# Patient Record
Sex: Male | Born: 1945
Health system: Southern US, Community
[De-identification: ages and names within clinical notes are randomized; demographics above are authoritative.]

## PROBLEM LIST (undated history)

## (undated) DIAGNOSIS — A4901 Methicillin susceptible Staphylococcus aureus infection, unspecified site: Secondary | ICD-10-CM

## (undated) DIAGNOSIS — M86169 Other acute osteomyelitis, unspecified tibia and fibula: Secondary | ICD-10-CM

## (undated) DIAGNOSIS — T847XXA Infection and inflammatory reaction due to other internal orthopedic prosthetic devices, implants and grafts, initial encounter: Secondary | ICD-10-CM

## (undated) DIAGNOSIS — E119 Type 2 diabetes mellitus without complications: Secondary | ICD-10-CM

## (undated) DIAGNOSIS — M869 Osteomyelitis, unspecified: Secondary | ICD-10-CM

## (undated) DIAGNOSIS — N400 Enlarged prostate without lower urinary tract symptoms: Secondary | ICD-10-CM

## (undated) DIAGNOSIS — I10 Essential (primary) hypertension: Secondary | ICD-10-CM

## (undated) HISTORY — DX: Osteomyelitis, unspecified: M86.9

## (undated) HISTORY — DX: Infection and inflammatory reaction due to other internal orthopedic prosthetic devices, implants and grafts, initial encounter: T84.7XXA

## (undated) HISTORY — DX: Methicillin susceptible Staphylococcus aureus infection, unspecified site: A49.01

## (undated) HISTORY — DX: Other acute osteomyelitis, unspecified tibia and fibula: M86.169

## (undated) HISTORY — DX: Essential (primary) hypertension: I10

---

## 1971-05-06 HISTORY — PX: OTHER SURGICAL HISTORY: SHX169

## 2004-08-05 ENCOUNTER — Ambulatory Visit: Payer: Self-pay | Admitting: Family Medicine

## 2004-08-12 ENCOUNTER — Ambulatory Visit: Payer: Self-pay | Admitting: Family Medicine

## 2004-08-23 ENCOUNTER — Ambulatory Visit: Payer: Self-pay | Admitting: Family Medicine

## 2004-10-28 ENCOUNTER — Ambulatory Visit: Payer: Self-pay | Admitting: Family Medicine

## 2004-11-21 ENCOUNTER — Ambulatory Visit: Payer: Self-pay | Admitting: Internal Medicine

## 2004-11-22 ENCOUNTER — Ambulatory Visit: Payer: Self-pay | Admitting: *Deleted

## 2005-01-24 ENCOUNTER — Ambulatory Visit: Payer: Self-pay | Admitting: Family Medicine

## 2006-12-23 ENCOUNTER — Ambulatory Visit: Payer: Self-pay | Admitting: Family Medicine

## 2007-02-04 ENCOUNTER — Encounter: Payer: Self-pay | Admitting: Physician Assistant

## 2007-04-05 ENCOUNTER — Encounter: Payer: Self-pay | Admitting: Physician Assistant

## 2007-04-05 ENCOUNTER — Ambulatory Visit: Payer: Self-pay | Admitting: Family Medicine

## 2007-09-21 ENCOUNTER — Encounter: Payer: Self-pay | Admitting: Physician Assistant

## 2009-01-03 HISTORY — PX: OTHER SURGICAL HISTORY: SHX169

## 2009-03-06 ENCOUNTER — Encounter: Payer: Self-pay | Admitting: Physician Assistant

## 2009-03-08 ENCOUNTER — Ambulatory Visit: Payer: Self-pay | Admitting: Physician Assistant

## 2009-03-14 ENCOUNTER — Encounter: Payer: Self-pay | Admitting: Physician Assistant

## 2009-03-16 ENCOUNTER — Encounter: Payer: Self-pay | Admitting: Physician Assistant

## 2009-03-19 ENCOUNTER — Ambulatory Visit (HOSPITAL_COMMUNITY): Admission: RE | Admit: 2009-03-19 | Discharge: 2009-03-19 | Payer: Self-pay | Admitting: Physician Assistant

## 2009-03-19 ENCOUNTER — Encounter: Payer: Self-pay | Admitting: Physician Assistant

## 2009-03-25 ENCOUNTER — Encounter: Payer: Self-pay | Admitting: Physician Assistant

## 2009-03-28 ENCOUNTER — Ambulatory Visit: Payer: Self-pay | Admitting: Physician Assistant

## 2009-04-02 ENCOUNTER — Encounter: Payer: Self-pay | Admitting: Physician Assistant

## 2009-04-06 ENCOUNTER — Encounter: Payer: Self-pay | Admitting: Physician Assistant

## 2009-04-09 ENCOUNTER — Encounter: Payer: Self-pay | Admitting: Physician Assistant

## 2009-04-23 ENCOUNTER — Encounter: Payer: Self-pay | Admitting: Physician Assistant

## 2009-06-08 ENCOUNTER — Encounter: Payer: Self-pay | Admitting: Physician Assistant

## 2009-06-08 ENCOUNTER — Ambulatory Visit: Payer: Self-pay | Admitting: Physician Assistant

## 2009-06-08 DIAGNOSIS — S82899A Other fracture of unspecified lower leg, initial encounter for closed fracture: Secondary | ICD-10-CM

## 2009-06-08 LAB — CONVERTED CEMR LAB
Casts: NONE SEEN /lpf
Ketones, urine, test strip: NEGATIVE
Nitrite: NEGATIVE
RBC / HPF: NONE SEEN (ref <3)
Specific Gravity, Urine: 1.015
Urobilinogen, UA: 0.2
pH: 6

## 2009-06-09 ENCOUNTER — Encounter: Payer: Self-pay | Admitting: Physician Assistant

## 2009-06-11 ENCOUNTER — Encounter: Payer: Self-pay | Admitting: Physician Assistant

## 2009-06-11 LAB — CONVERTED CEMR LAB
AST: 23 units/L (ref 0–37)
Alkaline Phosphatase: 143 units/L — ABNORMAL HIGH (ref 39–117)
BUN: 7 mg/dL (ref 6–23)
Basophils Absolute: 0 10*3/uL (ref 0.0–0.1)
Basophils Relative: 0 % (ref 0–1)
Creatinine, Ser: 0.81 mg/dL (ref 0.40–1.50)
Eosinophils Relative: 2 % (ref 0–5)
HCT: 46.8 % (ref 39.0–52.0)
Lymphocytes Relative: 30 % (ref 12–46)
Platelets: 223 10*3/uL (ref 150–400)
Potassium: 4.2 meq/L (ref 3.5–5.3)
RDW: 14.9 % (ref 11.5–15.5)
Total Bilirubin: 0.4 mg/dL (ref 0.3–1.2)

## 2009-06-12 ENCOUNTER — Telehealth: Payer: Self-pay | Admitting: Physician Assistant

## 2009-07-31 ENCOUNTER — Ambulatory Visit: Payer: Self-pay | Admitting: Physician Assistant

## 2009-07-31 DIAGNOSIS — K5900 Constipation, unspecified: Secondary | ICD-10-CM

## 2009-07-31 DIAGNOSIS — I10 Essential (primary) hypertension: Secondary | ICD-10-CM | POA: Insufficient documentation

## 2009-08-07 ENCOUNTER — Ambulatory Visit: Payer: Self-pay | Admitting: Physician Assistant

## 2009-08-14 ENCOUNTER — Ambulatory Visit: Payer: Self-pay | Admitting: Physician Assistant

## 2009-08-14 LAB — CONVERTED CEMR LAB
CO2: 24 meq/L (ref 19–32)
Calcium: 9.2 mg/dL (ref 8.4–10.5)
Chloride: 105 meq/L (ref 96–112)
Creatinine, Ser: 0.89 mg/dL (ref 0.40–1.50)
Glucose, Bld: 90 mg/dL (ref 70–99)

## 2009-08-15 ENCOUNTER — Encounter: Payer: Self-pay | Admitting: Physician Assistant

## 2009-09-11 ENCOUNTER — Ambulatory Visit: Payer: Self-pay | Admitting: Physician Assistant

## 2009-09-11 LAB — CONVERTED CEMR LAB
ALT: 15 units/L (ref 0–53)
Albumin: 3.9 g/dL (ref 3.5–5.2)
Alkaline Phosphatase: 132 units/L — ABNORMAL HIGH (ref 39–117)
Total Protein: 7 g/dL (ref 6.0–8.3)

## 2009-09-12 ENCOUNTER — Encounter: Payer: Self-pay | Admitting: Physician Assistant

## 2010-01-21 ENCOUNTER — Encounter: Payer: Self-pay | Admitting: Physician Assistant

## 2010-02-20 ENCOUNTER — Encounter (INDEPENDENT_AMBULATORY_CARE_PROVIDER_SITE_OTHER): Payer: Self-pay | Admitting: *Deleted

## 2010-03-11 ENCOUNTER — Ambulatory Visit: Payer: Self-pay | Admitting: Physician Assistant

## 2010-03-11 ENCOUNTER — Encounter (INDEPENDENT_AMBULATORY_CARE_PROVIDER_SITE_OTHER): Payer: Self-pay | Admitting: Nurse Practitioner

## 2010-03-11 LAB — CONVERTED CEMR LAB
Blood in Urine, dipstick: NEGATIVE
Chlamydia, Swab/Urine, PCR: NEGATIVE
GC Probe Amp, Urine: NEGATIVE
HDL goal, serum: 40 mg/dL
LDL Goal: 130 mg/dL
Nitrite: NEGATIVE
Specific Gravity, Urine: >=1.03
Urobilinogen, UA: 0.2
WBC Urine, dipstick: NEGATIVE

## 2010-03-12 ENCOUNTER — Ambulatory Visit: Payer: Self-pay | Admitting: Nurse Practitioner

## 2010-03-13 ENCOUNTER — Encounter (INDEPENDENT_AMBULATORY_CARE_PROVIDER_SITE_OTHER): Payer: Self-pay | Admitting: Nurse Practitioner

## 2010-03-13 LAB — CONVERTED CEMR LAB
Cholesterol: 205 mg/dL — ABNORMAL HIGH (ref 0–200)
HDL: 46 mg/dL (ref >39)
LDL Cholesterol: 118 mg/dL — ABNORMAL HIGH (ref 0–99)
Triglycerides: 207 mg/dL — ABNORMAL HIGH (ref <150)

## 2010-06-02 LAB — CONVERTED CEMR LAB
Blood in Urine, dipstick: NEGATIVE
Protein, U semiquant: NEGATIVE
Urobilinogen, UA: 0.2
WBC Urine, dipstick: NEGATIVE

## 2010-06-04 NOTE — Letter (Signed)
Summary: *HSN Results Follow up  [facility]  [address] [place], [place] [address]   Phone: [phone]  Fax: [phone]      02/20/2010   Louis Peterson [address] [place], [place]  [address]   Dear  Mr. [name],                            ____S.Drinkard,FNP   ____D. Gore,FNP       ____B. McPherson,MD   ____V. Rankins,MD    ____E. Mulberry,MD    ____N. Daphine Deutscher, FNP  ____D. Reche Dixon, MD    ____K. Philipp Deputy, MD    ____Other     This letter is to inform you that your recent test(s):  _______Pap Smear    _______Lab Test     _______X-ray    _______ is within acceptable limits  _______ requires a medication change  _______ requires a follow-up lab visit  ___X____ requires a follow-up visit with your provider   Comments: We have been trying to reach you.  Please give the office a call at your earliest convenience.       _________________________________________________________ If you have any questions, please contact our office                     Sincerely,  [place] Shannon [facility]

## 2010-06-04 NOTE — Medication Information (Signed)
Summary: RX Folder//REFILL  RX Folder//REFILL   Imported By: Arta Bruce 02/15/2010 09:53:05  _____________________________________________________________________  External Attachment:    Type:   Image     Comment:   External Document

## 2010-06-04 NOTE — Assessment & Plan Note (Signed)
Summary: Complete Physical Exam   Vital Signs:  Patient profile:   65 year old male Height:      66.75 inches Weight:      231.7 pounds BMI:     36.69 Temp:     97.2 degrees F oral Pulse rate:   100 / minute Pulse rhythm:   regular Resp:     20 per minute BP sitting:   124 / 70  (left arm) Cuff size:   large  Vitals Entered By: [name] (March 11, 2010 9:46 AM)  Nutrition Counseling: Patient's BMI is greater than 25 and therefore counseled on weight management options. CC: CPE, Lipid Management, Hypertension Management Is Patient Diabetic? No Pain Assessment Patient in pain? no       Does patient need assistance? Functional Status Self care Ambulation Normal   Primary Care [name]:  Tereso Newcomer, PA-C  CC:  CPE, Lipid Management, and Hypertension Management.  History of Present Illness:  Pt into the office for a complete physical exam  Social - Married  6 children (Two adult children present with pt today)  Optho - Last eye exam was 3 years ago.  No current glasses or contacts ? weakness of eye since childhood.    Dental - last dental exam was 5 years ago.    Pt is NOT fasting today for labs. He ate  breakfast today.  Tetanus - pt was in an accident in [place] one year ago at which time he had a trauma to his leg and thinks he was given the tetanus  Pt did not bring medications with him today in office.  Advised pt to bring meds to each appt  Colonscopy - no history  Pt admits to some constipation  Hypertension History:      He denies headache, chest pain, and palpitations.  He notes no problems with any antihypertensive medication side effects.  Pt has not taken lisinopril for the past 4-5 months. He was told to stop the medication.        Positive major cardiovascular risk factors include male age 95 years old or older and hypertension.  Negative major cardiovascular risk factors include non-tobacco-user status.    Lipid Management History:     Positive NCEP/ATP III risk factors include male age 24 years old or older and hypertension.  Negative NCEP/ATP III risk factors include non-tobacco-user status.      Habits & Providers  Alcohol-Tobacco-Diet     Alcohol drinks/day: 0     Tobacco Status: never  Exercise-Depression-Behavior     Does Patient Exercise: no     Exercise Counseling: not indicated; exercise is adequate     Have you felt down or hopeless? no     Have you felt little pleasure in things? no     Depression Counseling: not indicated; screening negative for depression     Drug Use: never  Comments: PHQ-9 score = 5  Allergies (verified): No Known Drug Allergies  Social History: Smoking Status:  never Drug Use:  never Does Patient Exercise:  no Ethnicity:  Hispanic/Latino/Black Sex Orientation:  Heterosexual Alcohol:  None Education:  illiterate  Review of Systems General:  Denies fever. Eyes:  Denies blurring. ENT:  Denies earache. CV:  Denies chest pain or discomfort. Resp:  Complains of cough; denies wheezing; at night. GI:  Complains of constipation; denies abdominal pain, nausea, and vomiting. GU:  Complains of urinary frequency. MS:  Denies joint pain. Derm:  Denies dryness. Neuro:  Denies headaches.  Psych:  Denies anxiety and depression.  Physical Exam  General:  alert.   Head:  normocephalic.   Eyes:  dilated vessel Ears:  right cerumen impaction left ear with visualization of TM Nose:  no external deformity.   Mouth:  pharynx pink and moist and fair dentition.  some visble caries Neck:  supple.   Chest Wall:  no deformities.   Breasts:  no gynecomastia.   Lungs:  normal breath sounds.   Heart:  normal rate.   Abdomen:  slight distention Rectal:  no external abnormalities.   Genitalia:  uncircumcised and no hydrocele.   Prostate:  no gland enlargement and no nodules.   Msk:  left - evidence of previous trauma Pulses:  R radial normal and L radial normal.   Extremities:   trace left pedal edema and trace right pedal edema.   Neurologic:  alert & oriented X3.   Skin:  color normal.   Psych:  Oriented X3.     Impression & Recommendations:  Problem # 1:  HEALTH MAINTENANCE EXAM (ICD-V70.0) pt to return tomorrow for labs rec optho and dental exam PHQ-9 score = 5 EKG done earlier this year Colonscopy refused at this time due to cost - indication given to pt and his family Orders: UA Dipstick w/o Micro (manual) (04540) Hemoccult Guaiac-1 spec.(in office) (82270) T-GC Probe, urine ([id number])  Problem # 2:  ESSENTIAL HYPERTENSION, BENIGN (ICD-401.1) BP is stable no current meds DASH diet His updated medication list for this problem includes:    Lisinopril 10 Mg Tabs (Lisinopril) ..... Hold  Orders: T-Urine Microalbumin w/creat. ratio [phone])  Problem # 3:  CERUMEN IMPACTION, RIGHT (ICD-380.4) irrigation done today in office  advised pt not to use q-tips in ears Orders: Cerumen Impaction Removal (96295)  Problem # 4:  NEED PROPHYLACTIC VACCINATION&INOCULATION FLU (ICD-V04.81) given today in office  Problem # 5:  CONSTIPATION (ICD-564.00) high fiber diet sheet given to pt His updated medication list for this problem includes:    Miralax Powd (Polyethylene glycol 3350) .Marland Kitchen... 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 1-2 puffs every 4-6 hours as needed  Complete Medication List: 1)  Flomax 0.4 Mg Caps (Tamsulosin hcl) .... Take 1 tablet by mouth once a day 2)  Ventolin Hfa 108 (90 Base) Mcg/act Aers (Albuterol sulfate) .Marland Kitchen.. 1-2 puffs every 4-6 hours as needed 3)  Finasteride 5 Mg Tabs (Finasteride) .... Take 1 tablet by mouth once a day 4)  Miralax Powd (Polyethylene glycol 3350) .Marland Kitchen.. 1 capful  dissolved in a glass of water and drink one glass a day 5)  Lisinopril 10 Mg Tabs (Lisinopril) .... Hold  Other Orders: Flu Vaccine 31yrs + (28413) Admin 1st Vaccine (24401)  Hypertension Assessment/Plan:      The patient's hypertensive risk group is category B: At least one risk factor (excluding diabetes) with no target organ damage.  Today's blood pressure is 124/70.  His blood pressure goal is < 140/90.  Lipid Assessment/Plan:      Based on NCEP/ATP III, the patient's risk factor category is "0-1 risk factors".  The patient's lipid goals are as follows: Total cholesterol goal is 200; LDL cholesterol goal is 130; HDL cholesterol goal is 40; Triglyceride goal is 150.  Patient Instructions: 1)  Return on tomorrow for fasting labs (no co-pay if you return on TUESDAY; will attach to today's visit) lipids, HIV, cbg.  You will also need Td if you did not received it last year in [place] (ask your wife) 2)  Schedule an appointment for retasure eye exam at [facility] 3)  Also you can go to the free vision screening later this week. 4)  Dentist - Free dental clinic later this week.   5)  Constipation - eat more foods with fiber.  Drink plenty of water. 6)  Get refills on miralax and take daily to keep stools soft 7)  Blood pressure is doing good.  Your urine will be checked today.  If kidneys are working too hard you may need to restart a low dose blood pressure medication for kidney protection. 8)  Follow up in 6 months or sooner for routine follow up.   Orders Added: 1)  Est. Patient age 30-64 [42] 2)  UA Dipstick w/o Micro (manual) [81002] 3)  T-Urine Microalbumin w/creat. ratio [[id number]] 4)  Hemoccult Guaiac-1 spec.(in office) [82270] 5)  Cerumen Impaction Removal [69210] 6)  T-GC Probe, urine [phone] 7)  Flu Vaccine 97yrs + [90658] 8)  Admin 1st Vaccine [90471]   Immunizations Administered:  Influenza Vaccine # 1:    Vaccine Type: Fluvax 3+    Site: right  deltoid    Mfr: [email or web address]    Dose: 0.5 ml    Route: IM    Given by: [name]    Exp. Date: 11/02/2010    Lot #: [id number]    VIS given: 11/27/09 version given March 11, 2010.  Flu Vaccine Consent Questions:    Do you have a history of severe allergic reactions to this vaccine? no    Any prior history of allergic reactions to egg and/or gelatin? no    Do you have a sensitivity to the preservative Thimersol? no    Do you have a past history of Guillan-Barre Syndrome? no    Do you currently have an acute febrile illness? no    Have you ever had a severe reaction to latex? no    Vaccine information given and explained to patient? yes    ndc  [phone]  Immunizations Administered:  Influenza Vaccine # 1:    Vaccine Type: Fluvax 3+    Site: right deltoid    Mfr: GlaxoSmithKline    Dose: 0.5 ml    Route: IM    Given by: [name]    Exp. Date: 11/02/2010    Lot #: [id number]    VIS given: 11/27/09 version given March 11, 2010.  Prevention & Chronic Care Immunizations   Influenza vaccine: Fluvax 3+  (03/11/2010)    Tetanus booster: Not documented   Td booster deferral: Deferred  (03/11/2010)    Pneumococcal vaccine: Not documented    H. zoster vaccine: Not documented  Colorectal Screening   Hemoccult: Not documented   Hemoccult action/deferral: Ordered  (03/11/2010)   Hemoccult due: 03/12/2011    Colonoscopy: Not documented   Colonoscopy action/deferral: Refused  (03/11/2010)  Other Screening   PSA: 2.17  (06/08/2009)   PSA due due: 03/12/2011   Smoking status: never  (03/11/2010)  Lipids   Total Cholesterol: Not documented   LDL: Not documented   LDL Direct: Not documented   HDL: Not documented   Triglycerides: Not documented  Hypertension   Last Blood Pressure: 124 / 70  (03/11/2010)   Serum creatinine:  0.89  (08/14/2009)   Serum potassium 4.5  (08/14/2009)  Self-Management Support :    Hypertension self-management support:  Not documented   Nursing Instructions: Give Flu vaccine today      Laboratory Results   Urine Tests  Date/Time Received: March 11, 2010 11:18 AM   Routine Urinalysis   Color: lt. yellow Appearance: Clear Glucose: negative   (Normal Range: Negative) Bilirubin: negative   (Normal Range: Negative) Ketone: negative   (Normal Range: Negative) Spec. Gravity: >=1.030   (Normal Range: 1.003-1.035) Blood: negative   (Normal Range: Negative) pH: 5.0   (Normal Range: 5.0-8.0) Protein: negative   (Normal Range: Negative) Urobilinogen: 0.2   (Normal Range: 0-1) Nitrite: negative   (Normal Range: Negative) Leukocyte Esterace: negative   (Normal Range: Negative)      Stool - Occult Blood Hemmoccult #1: negative Date: 03/11/2010   Laboratory Results   Urine Tests    Routine Urinalysis   Color: lt. yellow Appearance: Clear Glucose: negative   (Normal Range: Negative) Bilirubin: negative   (Normal Range: Negative) Ketone: negative   (Normal Range: Negative) Spec. Gravity: >=1.030   (Normal Range: 1.003-1.035) Blood: negative   (Normal Range: Negative) pH: 5.0   (Normal Range: 5.0-8.0) Protein: negative   (Normal Range: Negative) Urobilinogen: 0.2   (Normal Range: 0-1) Nitrite: negative   (Normal Range: Negative) Leukocyte Esterace: negative   (Normal Range: Negative)      Stool - Occult Blood Hemmoccult #1: negative

## 2010-06-04 NOTE — Letter (Signed)
Summary: [facility] SCHEDULING  [facility] SCHEDULING   Imported By: Arta Bruce 02/15/2010 09:51:06  _____________________________________________________________________  External Attachment:    Type:   Image     Comment:   External Document

## 2010-06-04 NOTE — Letter (Signed)
Summary: MAILED REQUESTED RECORSD TO [facility]  MAILED REQUESTED RECORSD TO [facility]   Imported By: Arta Bruce 01/21/2010 14:20:19  _____________________________________________________________________  External Attachment:    Type:   Image     Comment:   External Document

## 2010-06-04 NOTE — Progress Notes (Signed)
Summary: Office Visit//SCANNED INTO EMR  Office Visit//SCANNED INTO EMR   Imported By: Arta Bruce 02/15/2010 10:20:37  _____________________________________________________________________  External Attachment:    Type:   Image     Comment:   External Document

## 2010-06-04 NOTE — Letter (Signed)
Summary: Letter//LAB RESULTS  Letter//LAB RESULTS   Imported By: Arta Bruce 02/15/2010 09:54:49  _____________________________________________________________________  External Attachment:    Type:   Image     Comment:   External Document

## 2010-06-04 NOTE — Progress Notes (Signed)
Summary: Office Visit//SCANNED INTO EMR  Office Visit//SCANNED INTO EMR   Imported By: Arta Bruce 02/15/2010 09:33:41  _____________________________________________________________________  External Attachment:    Type:   Image     Comment:   External Document

## 2010-06-04 NOTE — Progress Notes (Signed)
Summary: Office Visit//DEPRESSION SCREENING  Office Visit//DEPRESSION SCREENING   Imported By: Arta Bruce 03/11/2010 16:18:06  _____________________________________________________________________  External Attachment:    Type:   Image     Comment:   External Document

## 2010-06-04 NOTE — Letter (Signed)
Summary: REQUESTING RECORDS FROM [facility]  REQUESTING RECORDS FROM [facility]   Imported By: Arta Bruce 02/15/2010 09:41:09  _____________________________________________________________________  External Attachment:    Type:   Image     Comment:   External Document

## 2010-06-04 NOTE — Letter (Signed)
Summary: *HSN Results Follow up  [facility]  [address] [place], [place] [address]   Phone: [phone]  Fax: [phone]      08/15/2009   Prithvi ESPITIA TOLEDO [address] [place], [place]  [address]   Dear  Mr. Carr ESPITIA TOLEDO,                            ____S.Drinkard,FNP   ____D. Gore,FNP       ____B. McPherson,MD   ____V. Rankins,MD    ____E. Mulberry,MD    ____N. Daphine Deutscher, FNP  ____D. Reche Dixon, MD    ____K. Philipp Deputy, MD    __x__S. Alben Spittle, PA-C     This letter is to inform you that your recent test(s):  _______Pap Smear    ___x____Lab Test     _______X-ray    ___x____ is within acceptable limits  _______ requires a medication change  _______ requires a follow-up lab visit  _______ requires a follow-up visit with your provider   Comments:       _________________________________________________________ If you have any questions, please contact our office                     Sincerely,  Tereso Newcomer PA-C [facility]

## 2010-06-04 NOTE — Letter (Signed)
Summary: urology notes  urology notes   Imported By: Arta Bruce 02/15/2010 10:18:20  _____________________________________________________________________  External Attachment:    Type:   Image     Comment:   External Document

## 2010-06-04 NOTE — Progress Notes (Signed)
  Phone Note Outgoing Call   Summary of Call: He needs treatment for UTI. Did we find out what pharmacy to send his meds. to? Initial call taken by: Brynda Rim,  June 12, 2009 5:09 PM  Follow-up for Phone Call        spoke with daughter in law and she is aware of meeting with susie for nutruction and pharmacy is [facility] Follow-up by: [place] Shannon,  June 13, 2009 12:55 PM  Additional Follow-up for Phone Call Additional follow up Details #1::        Will send cipro to [facility] pharmacy.  Pick up today or tomorrow.  Finish it all. He was c/o leaking at his last visit. Have patient call in 2 weeks if symptoms of leaking do not resolve.  Additional Follow-up by: Tereso Newcomer PA-C,  June 13, 2009 1:41 PM    Additional Follow-up for Phone Call Additional follow up Details #2::    spoke with daughter in law [place] and she said pt says he is doing alot better Follow-up by: [place] Shannon,  June 19, 2009 9:28 AM  Prescriptions: CIPRO 500 MG TABS (CIPROFLOXACIN HCL) Take 1 tablet by mouth two times a day until all gone (please write in Spanish)  #14 x 0   Entered and Authorized by:   Tereso Newcomer PA-C   Signed by:   Tereso Newcomer PA-C on 06/13/2009   Method used:   Faxed to ...       [facility] (retail)       [address] [place], [place]  [address]       Ph: [phone]       Fax: [phone]   RxID:   [id number]

## 2010-06-04 NOTE — Letter (Signed)
Summary: Letter//REFERRAL LETTER  Letter//REFERRAL LETTER   Imported By: Arta Bruce 02/15/2010 09:58:46  _____________________________________________________________________  External Attachment:    Type:   Image     Comment:   External Document

## 2010-06-04 NOTE — Letter (Signed)
Summary: Letter//LAB RESULTS  Letter//LAB RESULTS   Imported By: Arta Bruce 02/15/2010 10:05:07  _____________________________________________________________________  External Attachment:    Type:   Image     Comment:   External Document

## 2010-06-04 NOTE — Letter (Signed)
Summary: INFLUENZA VACCINATION  INFLUENZA VACCINATION   Imported By: Arta Bruce 02/15/2010 10:23:25  _____________________________________________________________________  External Attachment:    Type:   Image     Comment:   External Document

## 2010-06-04 NOTE — Assessment & Plan Note (Signed)
Summary: f/u in 6 wks with Geral Coker for prostate//gk   Vital Signs:  Patient profile:   65 year old male Weight:      229.50 pounds Temp:     97.9 degrees F Pulse rate:   90 / minute Pulse rhythm:   regular Resp:     18 per minute BP sitting:   152 / 108  (left arm) Cuff size:   large  Vitals Entered By: Chauncy Passy, SMA  Serial Vital Signs/Assessments:  Time      Position  BP       Pulse  Resp  Temp     By 9:00 AM             142/96                         Tereso Newcomer PA-C  CC: Pt. is here for a f/u from 06/08/09 for his prostate. Pt. states his doing better but the only thing bothering him today is that he has been constipated for the last 3 days.  Is Patient Diabetic? No Pain Assessment Patient in pain? no       Does patient need assistance? Functional Status Self care Ambulation Normal   Primary Care Provider:  Tereso Newcomer, PA-C  CC:  Pt. is here for a f/u from 06/08/09 for his prostate. Pt. states his doing better but the only thing bothering him today is that he has been constipated for the last 3 days. Marland Kitchen  History of Present Illness: Here for f/u.  Had incontinence last time and I check a urine cx.  It was + and I tx him for UTI  No further incontinence.  His urine stream is better.  No significant nocturia.  He is taking both flomax and finasteride.    He is constipated x 3 days.  He has noted more problems with constipation since his accident.  No diarrhea.  No fevers or chills.  No melena or hematochezia.    Has been watching his weight and dieting.  He has lost 7 pounds.  He is complaining of yellow, thickened toenails that are hard to clip.  Current Medications (verified): 1)  Flomax 0.4 Mg Caps (Tamsulosin Hcl) .... Take 1 Tablet By Mouth Once A Day 2)  Ventolin Hfa 108 (90 Base) Mcg/act Aers (Albuterol Sulfate) .Marland Kitchen.. 1-2 Puffs Every 4-6 Hours As Needed 3)  Prilosec 20 Mg Cpdr (Omeprazole) .... Take 1 Tablet By Mouth Two Times A Day 4)  Finasteride 5 Mg  Tabs (Finasteride) .... Take 1 Tablet By Mouth Once A Day  Allergies (verified): No Known Drug Allergies  Review of Systems      See HPI General:  Denies chills and fever. CV:  Denies chest pain or discomfort, fainting, and shortness of breath with exertion. Resp:  Denies cough. GI:  Complains of indigestion. GU:  Denies dysuria and incontinence.  Physical Exam  General:  alert, well-developed, and well-nourished.   Head:  normocephalic and atraumatic.   Neck:  supple.   Lungs:  normal breath sounds.   Heart:  normal rate and regular rhythm.   Abdomen:  soft and non-tender.   Msk:  bilat toenails with thickening and yellowing c/w onychomycosis Extremities:  trace left pedal edema.   Neurologic:  alert & oriented X3 and cranial nerves II-XII intact.   Psych:  normally interactive and good eye contact.     Impression & Recommendations:  Problem # 1:  CONSTIPATION (ICD-564.00)  increase fiber try metamucil give Rx for Miralax if he needs it handout given  His updated medication list for this problem includes:    Miralax Powd (Polyethylene glycol 3350) .Marland Kitchen... 1 capful dissolved in a glass of water and drink one glass a day  Problem # 2:  URGE INCONTINENCE (ICD-788.31)  resolved with tx of UTI check u/a  Orders: T-Urinalysis ([id number])  Problem # 3:  GLUCOSE INTOLERANCE (ICD-271.3) needs to see dietician A1C was 6.3 when checked  Problem # 4:  ONYCHOMYCOSIS, TOENAILS (ICD-110.1)  start Lamisil reck LFTs in 6 weeks  His updated medication list for this problem includes:    Lamisil 250 Mg Tabs (Terbinafine hcl) .Marland Kitchen... 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 Take 1 tablet by mouth once a day for blood pressure (write in spanish)  Orders: EKG w/ Interpretation (93000)  Problem # 7:  PREVENTIVE HEALTH CARE (ICD-V70.0) schedule CPE  Complete Medication List: 1)  Flomax 0.4 Mg Caps (Tamsulosin hcl) .... Take 1 tablet by mouth once a day 2)  Ventolin Hfa 108 (90 Base) Mcg/act Aers (Albuterol sulfate) .Marland Kitchen.. 1-2 puffs every 4-6 hours as needed 3)  Prilosec 20 Mg Cpdr (Omeprazole) .... Take 1 tablet by mouth two times a day 4)  Finasteride 5 Mg Tabs (Finasteride) .... Take 1 tablet by mouth once a day 5)  Miralax Powd (Polyethylene glycol 3350) .Marland Kitchen.. 1 capful dissolved in a glass of water and drink one glass a day 6)  Lamisil 250 Mg Tabs (Terbinafine hcl) .... Take 1 tablet by mouth once a day for nail fungus (write in spanish) 7)  Lisinopril 10 Mg Tabs (Lisinopril) .... Take 1 tablet by mouth once a day for blood pressure (write in spanish)  Patient Instructions: 1)  Increase fiber in your diet.  See handout. 2)  You can get Metamucil at the drug store and drink one glass a day. 3)  If you do not get enough relief from the Metamucil, get the prescription filled for Miralax and drink one glass a day. 4)  Return to lab in 6 weeks for Hepatic Function Panel (Dx 110.1). 5)  Schedule appt with Susie Piper for glucose intolerance. 6)  Return to lab in 2 weeks for BMET (401.1) and BP check. 7)  Please schedule a follow-up appointment in 3 months with Ulus Hazen for CPE.  8)  Come fasting to CPE for labwork (nothing to eat or drink after midnight except water). Prescriptions: LISINOPRIL 10 MG TABS (LISINOPRIL) Take 1 tablet by mouth once a day for blood pressure (write in Spanish)  #30 x 5   Entered and Authorized by:   Tereso Newcomer PA-C   Signed by:   Tereso Newcomer PA-C on 07/31/2009   Method used:   Print then Give to Patient   RxID:    [id number] LAMISIL 250 MG TABS (TERBINAFINE HCL) Take 1 tablet by mouth once a day for nail fungus (write in Spanish)  #30 x 2   Entered and Authorized by:  Tereso Newcomer PA-C   Signed by:   Tereso Newcomer PA-C on 07/31/2009   Method used:   Print then Give to Patient   RxID:   [id number] MIRALAX  POWD (POLYETHYLENE GLYCOL 3350) 1 capful dissolved in a glass of water and drink one glass a day  #1 bottle x 3   Entered and Authorized by:   Tereso Newcomer PA-C   Signed by:   Tereso Newcomer PA-C on 07/31/2009   Method used:   Print then Give to Patient   RxID:   [id number]   Laboratory Results   Urine Tests  Date/Time Received: July 31, 2009 10:21 AM  Date/Time Reported: July 31, 2009 10:21 AM   Routine Urinalysis   Color: yellow Appearance: Clear Glucose: negative   (Normal Range: Negative) Bilirubin: negative   (Normal Range: Negative) Ketone: negative   (Normal Range: Negative) Spec. Gravity: 1.025   (Normal Range: 1.003-1.035) Blood: negative   (Normal Range: Negative) pH: 6.5   (Normal Range: 5.0-8.0) Protein: negative   (Normal Range: Negative) Urobilinogen: 0.2   (Normal Range: 0-1) Nitrite: negative   (Normal Range: Negative) Leukocyte Esterace: negative   (Normal Range: Negative)         EKG  Procedure date:  07/31/2009  Findings:      Normal sinus rhythm with rate of:  63 normal axis  no acute changes

## 2010-06-04 NOTE — Letter (Signed)
Summary: Handout Printed  Printed Handout:  - Diet - High-Fiber 

## 2010-06-04 NOTE — Medication Information (Signed)
Summary: Tax adviser   Imported By: Arta Bruce 02/15/2010 10:25:43  _____________________________________________________________________  External Attachment:    Type:   Image     Comment:   External Document

## 2010-06-04 NOTE — Assessment & Plan Note (Signed)
Vital Signs:  Patient profile:   65 year old male Weight:      236 pounds Temp:     97.7 degrees F oral Pulse rate:   58 / minute Pulse rhythm:   regular BP sitting:   154 / 82  (left arm) Cuff size:   large  Vitals Entered By: [place] Shannon (June 08, 2009 9:15 AM)  Serial Vital Signs/Assessments:  Time      Position  BP       Pulse  Resp  Temp     By 10:08 AM            138/84                         Tereso Newcomer PA-C  CC: f/u on prostate and leg Is Patient Diabetic? No  Does patient need assistance? Functional Status Self care Ambulation Normal   CC:  f/u on prostate and leg.  History of Present Illness: Here for f/u. Of note, his chart has been mixed up with his son's chart. He is called "Daisy" and his son is called "Chava."  Left Leg Fracture:  Has seen ortho at [facility].  Plan is for rehab first.  F/u in 3 mos.  Will decide +/- surgery in next 3-6 mos.  BPH:  Has noticed difficulty with urinary frequency and urge incontinence since his accident in July.  No changes.  No dysuria.  No stress incontinence.  No fevers or chills.    BP:  Remains elevated.  No chest pain, dyspnea or headaches.  Did drink coffee prior to coming in today.   Physical Exam  General:  alert, well-developed, and well-nourished.   Head:  normocephalic and atraumatic.   Neck:  supple and no carotid bruits.   Lungs:  normal breath sounds, no crackles, and no wheezes.   Heart:  normal rate, regular rhythm, and no murmur.   Genitalia:  circumcised, no hydrocele, no varicocele, no scrotal masses, no testicular masses or atrophy, no cutaneous lesions, and no urethral discharge.   Prostate:  no nodules, no asymmetry, and no induration.   slightly enlarged (<1+) Msk:  antalgic gait walks with crutches Neurologic:  alert & oriented X3 and cranial nerves II-XII intact.   Psych:  normally interactive.     Impression & Recommendations:  Problem # 1:  URGE INCONTINENCE (ICD-788.31)  check  u/a check PSA hold flomax  if above unrevealing, will need referral back to urology (? neurogenic bladder 2/2 accident) will have to go to [facility] due to our patients not being seen at [facility]  Orders: T-Urinalysis ([id number]) T-PSA (15176-16073) T-Comprehensive Metabolic Panel [phone]) T-CBC w/Diff ([id number]) T-Culture, Urine ([id number]) T- * Misc. 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)  as above continue finasteride  Orders: T-PSA ([id number])  Problem # 3:  INCREASED BLOOD PRESSURE (ICD-796.2)  repeat bp ok watch salt and increase activity  continue to monitor  Orders: T-Comprehensive Metabolic Panel ([id number])  Problem # 4:  FRACTURE, LEG, LEFT (ICD-827.0) f/u at [facility] as planned  Problem # 5:  Preventive Health Care (ICD-V70.0)  check labs today will eventually need to schedule CPE   Orders: T-Comprehensive Metabolic Panel ([id number]) T-CBC w/Diff ([id number]) T-TSH ([id number])  Complete Medication List: 1)  Flomax 0.4 Mg Caps (Tamsulosin hcl) .... Take 1 tablet by mouth once a day 2)  Ventolin Hfa 108 (90 Base)  Mcg/act Aers (Albuterol sulfate) .Marland Kitchen.. 1-2 puffs every 4-6 hours as needed 3)  Prilosec 20 Mg Cpdr (Omeprazole) .... Take 1 tablet by mouth two times a day 4)  Finasteride 5 Mg Tabs (Finasteride) .... Take 1 tablet by mouth once a day  Patient Instructions: 1)  Please schedule a follow-up appointment in 6 weeks with Lorin Picket for prostate.  2)  Watch salt. 3)  Limit caffeine. 4)  Increase activity as tolerated. 5)  Do not take Flomax for 2 weeks.  If symptoms get worse, restart the medicine.  If your symptoms improve, stay off of Flomax. 6)  Follow up with the orthopedist at [facility] as planned.  Laboratory Results   Urine Tests    Routine Urinalysis   Glucose: negative   (Normal Range: Negative) Bilirubin: negative   (Normal Range: Negative) Ketone: negative   (Normal  Range: Negative) Spec. Gravity: 1.015   (Normal Range: 1.003-1.035) Blood: trace-intact   (Normal Range: Negative) pH: 6.0   (Normal Range: 5.0-8.0) Protein: negative   (Normal Range: Negative) Urobilinogen: 0.2   (Normal Range: 0-1) Nitrite: negative   (Normal Range: Negative) Leukocyte Esterace: negative   (Normal Range: Negative)

## 2010-06-04 NOTE — Letter (Signed)
Summary: NUTRITION SUMMARY/SUSIE  NUTRITION SUMMARY/SUSIE   Imported By: Arta Bruce 08/15/2009 10:18:33  _____________________________________________________________________  External Attachment:    Type:   Image     Comment:   External Document

## 2010-06-04 NOTE — Letter (Signed)
Summary: *HSN Results Follow up  [facility]  [address] [place], [place] [address]   Phone: [phone]  Fax: [phone]      09/12/2009   Leor ESPITIA TOLEDO [address] [place], [place]  [address]   Dear  Mr. Dehaven ESPITIA TOLEDO,                            ____S.Drinkard,FNP   ____D. Gore,FNP       ____B. McPherson,MD   ____V. Rankins,MD    ____E. Mulberry,MD    ____N. Daphine Deutscher, FNP  ____D. Reche Dixon, MD    ____K. Philipp Deputy, MD    __x__S. Alben Spittle, PA-C    This letter is to inform you that your recent test(s):  _______Pap Smear    ___x____Lab Test     _______X-ray    ___x____ is within acceptable limits  _______ requires a medication change  _______ requires a follow-up lab visit  _______ requires a follow-up visit with your provider   Comments: It is ok to continue the Lamisil (Terbinafine).  You should not take it longer than 3 months.       _________________________________________________________ If you have any questions, please contact our office                     Sincerely,  Tereso Newcomer PA-C [facility]

## 2010-06-04 NOTE — Letter (Signed)
Summary: External Other//PHONE NOTE  External Other//PHONE NOTE   Imported By: Arta Bruce 02/15/2010 09:45:16  _____________________________________________________________________  External Attachment:    Type:   Image     Comment:   External Document

## 2010-06-04 NOTE — Letter (Signed)
Summary: Lipid Letter  [facility]  [address] [place], [place] [address]   Phone: [phone]  Fax: [phone]    03/13/2010  [facility] [address] [place], [place]  [address]  Dear Mort Sawyers:  We have carefully reviewed your last lipid profile from 03/12/2010 and the results are noted below with a summary of recommendations for lipid management.    Cholesterol:       205     Goal: less than 200   HDL "good" Cholesterol:   46     Goal: greater than 40   LDL "bad" Cholesterol:   118     Goal: less than 130   Triglycerides:       207     Goal: less than 150    Labs done during recent office visit show that your cholesterol is elevated. You should have spoken with this office about the need to monitor your diet and start to exercise.  No medications needed at this time.  Schedule a lab visit in 3 months to recheck cholesterol.      Current Medications: 1)    Flomax 0.4 Mg Caps (Tamsulosin hcl) .... Take 1 tablet by mouth once a day 2)    Ventolin Hfa 108 (90 Base) Mcg/act Aers (Albuterol sulfate) .Marland Kitchen.. 1-2 puffs every 4-6 hours as needed 3)    Finasteride 5 Mg Tabs (Finasteride) .... Take 1 tablet by mouth once a day 4)    Miralax  Powd (Polyethylene glycol 3350) .Marland Kitchen.. 1 capful dissolved in a glass of water and drink one glass a day 5)    Lisinopril 10 Mg Tabs (Lisinopril) .... Hold  If you have any questions, please call. We appreciate being able to work with you.   Sincerely,    [facility]

## 2013-04-07 DIAGNOSIS — IMO0002 Reserved for concepts with insufficient information to code with codable children: Secondary | ICD-10-CM | POA: Diagnosis not present

## 2013-04-07 DIAGNOSIS — M899 Disorder of bone, unspecified: Secondary | ICD-10-CM | POA: Diagnosis not present

## 2013-04-07 DIAGNOSIS — M7750 Other enthesopathy of unspecified foot: Secondary | ICD-10-CM | POA: Diagnosis not present

## 2013-04-07 DIAGNOSIS — M19079 Primary osteoarthritis, unspecified ankle and foot: Secondary | ICD-10-CM | POA: Diagnosis not present

## 2013-04-07 DIAGNOSIS — S82209A Unspecified fracture of shaft of unspecified tibia, initial encounter for closed fracture: Secondary | ICD-10-CM | POA: Diagnosis not present

## 2013-04-07 DIAGNOSIS — M7989 Other specified soft tissue disorders: Secondary | ICD-10-CM | POA: Diagnosis not present

## 2013-04-07 DIAGNOSIS — I872 Venous insufficiency (chronic) (peripheral): Secondary | ICD-10-CM | POA: Diagnosis not present

## 2013-04-07 DIAGNOSIS — I999 Unspecified disorder of circulatory system: Secondary | ICD-10-CM | POA: Diagnosis not present

## 2013-08-15 ENCOUNTER — Inpatient Hospital Stay (HOSPITAL_BASED_OUTPATIENT_CLINIC_OR_DEPARTMENT_OTHER)
Admission: EM | Admit: 2013-08-15 | Discharge: 2013-08-21 | DRG: 496 | Disposition: A | Discharge status: Home Health | Payer: Medicare Other | Attending: Internal Medicine | Admitting: Internal Medicine

## 2013-08-15 ENCOUNTER — Emergency Department (HOSPITAL_BASED_OUTPATIENT_CLINIC_OR_DEPARTMENT_OTHER): Payer: Medicare Other

## 2013-08-15 ENCOUNTER — Encounter (HOSPITAL_BASED_OUTPATIENT_CLINIC_OR_DEPARTMENT_OTHER): Payer: Self-pay | Admitting: Emergency Medicine

## 2013-08-15 DIAGNOSIS — E669 Obesity, unspecified: Secondary | ICD-10-CM | POA: Diagnosis present

## 2013-08-15 DIAGNOSIS — T847XXA Infection and inflammatory reaction due to other internal orthopedic prosthetic devices, implants and grafts, initial encounter: Secondary | ICD-10-CM | POA: Diagnosis not present

## 2013-08-15 DIAGNOSIS — M869 Osteomyelitis, unspecified: Secondary | ICD-10-CM | POA: Diagnosis present

## 2013-08-15 DIAGNOSIS — M9080 Osteopathy in diseases classified elsewhere, unspecified site: Secondary | ICD-10-CM | POA: Diagnosis present

## 2013-08-15 DIAGNOSIS — L02419 Cutaneous abscess of limb, unspecified: Secondary | ICD-10-CM | POA: Diagnosis present

## 2013-08-15 DIAGNOSIS — E119 Type 2 diabetes mellitus without complications: Secondary | ICD-10-CM | POA: Diagnosis not present

## 2013-08-15 DIAGNOSIS — M7989 Other specified soft tissue disorders: Secondary | ICD-10-CM | POA: Diagnosis not present

## 2013-08-15 DIAGNOSIS — Y831 Surgical operation with implant of artificial internal device as the cause of abnormal reaction of the patient, or of later complication, without mention of misadventure at the time of the procedure: Secondary | ICD-10-CM | POA: Diagnosis present

## 2013-08-15 DIAGNOSIS — M79609 Pain in unspecified limb: Secondary | ICD-10-CM | POA: Diagnosis not present

## 2013-08-15 DIAGNOSIS — A4901 Methicillin susceptible Staphylococcus aureus infection, unspecified site: Secondary | ICD-10-CM | POA: Diagnosis not present

## 2013-08-15 DIAGNOSIS — L0390 Cellulitis, unspecified: Secondary | ICD-10-CM | POA: Diagnosis present

## 2013-08-15 DIAGNOSIS — R32 Unspecified urinary incontinence: Secondary | ICD-10-CM | POA: Diagnosis present

## 2013-08-15 DIAGNOSIS — R739 Hyperglycemia, unspecified: Secondary | ICD-10-CM

## 2013-08-15 DIAGNOSIS — Z6835 Body mass index (BMI) 35.0-35.9, adult: Secondary | ICD-10-CM

## 2013-08-15 DIAGNOSIS — Z452 Encounter for adjustment and management of vascular access device: Secondary | ICD-10-CM | POA: Diagnosis not present

## 2013-08-15 DIAGNOSIS — Z96698 Presence of other orthopedic joint implants: Secondary | ICD-10-CM | POA: Diagnosis not present

## 2013-08-15 DIAGNOSIS — Z4789 Encounter for other orthopedic aftercare: Secondary | ICD-10-CM | POA: Diagnosis not present

## 2013-08-15 DIAGNOSIS — B957 Other staphylococcus as the cause of diseases classified elsewhere: Secondary | ICD-10-CM | POA: Diagnosis not present

## 2013-08-15 DIAGNOSIS — R7309 Other abnormal glucose: Secondary | ICD-10-CM | POA: Diagnosis not present

## 2013-08-15 DIAGNOSIS — B379 Candidiasis, unspecified: Secondary | ICD-10-CM

## 2013-08-15 DIAGNOSIS — K5900 Constipation, unspecified: Secondary | ICD-10-CM | POA: Diagnosis not present

## 2013-08-15 DIAGNOSIS — R030 Elevated blood-pressure reading, without diagnosis of hypertension: Secondary | ICD-10-CM

## 2013-08-15 DIAGNOSIS — I739 Peripheral vascular disease, unspecified: Secondary | ICD-10-CM | POA: Diagnosis present

## 2013-08-15 DIAGNOSIS — E876 Hypokalemia: Secondary | ICD-10-CM | POA: Diagnosis not present

## 2013-08-15 DIAGNOSIS — L03119 Cellulitis of unspecified part of limb: Secondary | ICD-10-CM

## 2013-08-15 DIAGNOSIS — S82899A Other fracture of unspecified lower leg, initial encounter for closed fracture: Secondary | ICD-10-CM | POA: Diagnosis not present

## 2013-08-15 DIAGNOSIS — E1165 Type 2 diabetes mellitus with hyperglycemia: Secondary | ICD-10-CM | POA: Diagnosis not present

## 2013-08-15 DIAGNOSIS — IMO0002 Reserved for concepts with insufficient information to code with codable children: Secondary | ICD-10-CM | POA: Diagnosis not present

## 2013-08-15 DIAGNOSIS — I10 Essential (primary) hypertension: Secondary | ICD-10-CM | POA: Diagnosis present

## 2013-08-15 DIAGNOSIS — Z794 Long term (current) use of insulin: Secondary | ICD-10-CM | POA: Diagnosis not present

## 2013-08-15 DIAGNOSIS — IMO0001 Reserved for inherently not codable concepts without codable children: Secondary | ICD-10-CM

## 2013-08-15 DIAGNOSIS — T84498A Other mechanical complication of other internal orthopedic devices, implants and grafts, initial encounter: Secondary | ICD-10-CM | POA: Diagnosis not present

## 2013-08-15 DIAGNOSIS — L0291 Cutaneous abscess, unspecified: Secondary | ICD-10-CM | POA: Diagnosis not present

## 2013-08-15 DIAGNOSIS — M25579 Pain in unspecified ankle and joints of unspecified foot: Secondary | ICD-10-CM | POA: Diagnosis not present

## 2013-08-15 DIAGNOSIS — E1169 Type 2 diabetes mellitus with other specified complication: Secondary | ICD-10-CM

## 2013-08-15 DIAGNOSIS — S81009A Unspecified open wound, unspecified knee, initial encounter: Secondary | ICD-10-CM | POA: Diagnosis not present

## 2013-08-15 HISTORY — DX: Benign prostatic hyperplasia without lower urinary tract symptoms: N40.0

## 2013-08-15 LAB — URINALYSIS, ROUTINE W REFLEX MICROSCOPIC
Bilirubin Urine: NEGATIVE
Glucose, UA: >1000 mg/dL — AB
Hgb urine dipstick: NEGATIVE
KETONES UR: 15 mg/dL — AB
LEUKOCYTES UA: NEGATIVE
NITRITE: NEGATIVE
PROTEIN: NEGATIVE mg/dL
Specific Gravity, Urine: 1.045 — ABNORMAL HIGH (ref 1.005–1.030)
Urobilinogen, UA: 0.2 mg/dL (ref 0.0–1.0)
pH: 5.5 (ref 5.0–8.0)

## 2013-08-15 LAB — CBC WITH DIFFERENTIAL/PLATELET
BASOS ABS: 0 10*3/uL (ref 0.0–0.1)
BASOS PCT: 0 % (ref 0–1)
Eosinophils Absolute: 0.1 10*3/uL (ref 0.0–0.7)
Eosinophils Relative: 1 % (ref 0–5)
HCT: 48.5 % (ref 39.0–52.0)
Hemoglobin: 16.9 g/dL (ref 13.0–17.0)
Lymphocytes Relative: 19 % (ref 12–46)
Lymphs Abs: 1.9 10*3/uL (ref 0.7–4.0)
MCH: 31.4 pg (ref 26.0–34.0)
MCHC: 34.8 g/dL (ref 30.0–36.0)
MCV: 90.1 fL (ref 78.0–100.0)
MONO ABS: 0.8 10*3/uL (ref 0.1–1.0)
Monocytes Relative: 8 % (ref 3–12)
NEUTROS ABS: 7.3 10*3/uL (ref 1.7–7.7)
NEUTROS PCT: 72 % (ref 43–77)
Platelets: 207 10*3/uL (ref 150–400)
RBC: 5.38 MIL/uL (ref 4.22–5.81)
RDW: 13.6 % (ref 11.5–15.5)
WBC: 10.1 10*3/uL (ref 4.0–10.5)

## 2013-08-15 LAB — COMPREHENSIVE METABOLIC PANEL
ALBUMIN: 3 g/dL — AB (ref 3.5–5.2)
ALT: 33 U/L (ref 0–53)
AST: 22 U/L (ref 0–37)
Alkaline Phosphatase: 178 U/L — ABNORMAL HIGH (ref 39–117)
BILIRUBIN TOTAL: 0.3 mg/dL (ref 0.3–1.2)
BUN: 20 mg/dL (ref 6–23)
CHLORIDE: 97 meq/L (ref 96–112)
CO2: 25 mEq/L (ref 19–32)
CREATININE: 0.8 mg/dL (ref 0.50–1.35)
Calcium: 9.2 mg/dL (ref 8.4–10.5)
GFR calc Af Amer: >90 mL/min (ref >90)
GFR calc non Af Amer: 90 mL/min — ABNORMAL LOW (ref >90)
Glucose, Bld: 690 mg/dL (ref 70–99)
POTASSIUM: 4.6 meq/L (ref 3.7–5.3)
Sodium: 137 mEq/L (ref 137–147)
Total Protein: 7.4 g/dL (ref 6.0–8.3)

## 2013-08-15 LAB — URINE MICROSCOPIC-ADD ON

## 2013-08-15 LAB — CBG MONITORING, ED

## 2013-08-15 MED ORDER — INSULIN REGULAR HUMAN 100 UNIT/ML IJ SOLN
10.0000 [IU] | Freq: Once | INTRAMUSCULAR | Status: AC
  Administered 2013-08-15: 10 [IU] via INTRAVENOUS
  Filled 2013-08-15: qty 1

## 2013-08-15 MED ORDER — SODIUM CHLORIDE 0.9 % IV BOLUS (SEPSIS)
1000.0000 mL | INTRAVENOUS | Status: AC
  Administered 2013-08-15: 1000 mL via INTRAVENOUS

## 2013-08-15 MED ORDER — FLUCONAZOLE 100 MG PO TABS
150.0000 mg | ORAL_TABLET | Freq: Once | ORAL | Status: AC
  Administered 2013-08-15: 150 mg via ORAL
  Filled 2013-08-15 (×2): qty 1

## 2013-08-15 MED ORDER — VANCOMYCIN HCL IN DEXTROSE 1-5 GM/200ML-% IV SOLN
1000.0000 mg | Freq: Once | INTRAVENOUS | Status: AC
  Administered 2013-08-15: 1000 mg via INTRAVENOUS
  Filled 2013-08-15: qty 200

## 2013-08-15 NOTE — Evaluation (Signed)
[facility] call from ER [facility] about patient with new-onset diabetes as well as the left lower extremity cellulitis. Per report, patient has had 2 or 3 weeks of polyuria, polydipsia as well as penile itching irritation. Has poor followup. Received majority of care in [place]. Initial blood sugar of 690. No anion gap. No metabolic acidosis. Also with left lower extremity circumferential cellulitis. Noted prior history of distal tibial fracture s/p ORIF. Some of the screws seem to have loosened on imaging. White blood cell count of 10.1. Patient started on vancomycin for soft tissue coverage. Hemodynamically stable. Also started on IV insulin. We'll except to a telemetry bed. [facility]  Reported active problems: Hyperglycemia Left lower extremity cellulitis ? Balanitis

## 2013-08-15 NOTE — ED Notes (Addendum)
Urinary incontinence, rash and itching for 3 months. Extreme thirst, dry mouth and weight loss.

## 2013-08-15 NOTE — ED Notes (Signed)
Per Pt. And translator for Pt.   The Pt. Has had itching on the end of his penis with c/o urinating "alot".

## 2013-08-15 NOTE — Progress Notes (Signed)
ANTIBIOTIC CONSULT NOTE - INITIAL  Pharmacy Consult for vancomycin Indication: cellulitis  No Known Allergies  Patient Measurements: Height: 5\' 5"  (165.1 cm) Weight: 215 lb (97.523 kg) IBW/kg (Calculated) : 61.5 Adjusted Body Weight:   Vital Signs: Temp: 97.8 F (36.6 C) (04/13 1912) Temp src: Oral (04/13 1912) BP: 154/88 mmHg (04/13 2231) Pulse Rate: 103 (04/13 2225) Intake/Output from previous day:   Intake/Output from this shift:    Labs:  Recent Labs  08/15/13 1953  WBC 10.1  HGB 16.9  PLT 207  CREATININE 0.80   Estimated Creatinine Clearance: 94.9 ml/min (by C-G formula based on Cr of 0.8). No results found for this basename: VANCOTROUGH, VANCOPEAK, VANCORANDOM, GENTTROUGH, GENTPEAK, GENTRANDOM, TOBRATROUGH, TOBRAPEAK, TOBRARND, AMIKACINPEAK, AMIKACINTROU, AMIKACIN,  in the last 72 hours   Microbiology: No results found for this or any previous visit (from the past 720 hour(s)).  Medical History: History reviewed. No pertinent past medical history.  Medications:  Anti-infectives   Start     Dose/Rate Route Frequency Ordered Stop   08/15/13 2245  vancomycin (VANCOCIN) IVPB 1000 mg/200 mL premix     1,000 mg 200 mL/hr over 60 Minutes Intravenous  Once 08/15/13 2241       Assessment: 68 yom presented to the ED with urinary incontinence, rash and itching. To start vancomycin for treatment of possible cellulitis. Pt is afebrile, WBC is WNL and SCr is WNL.   vanc 4/13>>  Goal of Therapy:  Vancomycin trough level 10-15 mcg/ml  Plan:  1. Vancomycin 1gm IV x 1  2. After transfer, will enter 1250mg  IV Q12H 3. F/u renal fxn, C&S, [facility]  Louis Peterson Louis Peterson 08/15/2013,10:44 PM

## 2013-08-15 NOTE — ED Provider Notes (Signed)
CSN: [id number]     Arrival date & time 08/15/13  1856 History   First MD Initiated Contact with Patient 08/15/13 1906     Chief Complaint  Patient presents with  . Urinary Incontinence     (Consider location/radiation/quality/duration/timing/severity/associated sxs/prior Treatment) HPI Comments: Patient presents emergency department with chief complaint of urinary incontinence, rash, and itching x3 months. He also reports rash and open wound to his left lower extremity. He denies fevers, chills, chest pain, shortness of breath, nausea, vomiting, diarrhea. He does report having constipation. He states that sometimes she is able to feel a sensation or urinate, but other times he does not, and urinates on himself. He states this happens daily. He also reports an unexpected weight loss of approximately 20 pounds over the past 3 months. Additionally, he states that he has been very thirsty, and has had a dry mouth. There are no aggravating or alleviating factors. He does not reported any other known health problems.  The history is provided by the patient. No language interpreter was used.    History reviewed. No pertinent past medical history. History reviewed. No pertinent past surgical history. No family history on file. History  Substance Use Topics  . Smoking status: Never Smoker   . Smokeless tobacco: Not on file  . Alcohol Use: No    Review of Systems  Constitutional: Negative for fever and chills.  Respiratory: Negative for shortness of breath.   Cardiovascular: Negative for chest pain.  Gastrointestinal: Positive for constipation. Negative for nausea, vomiting and diarrhea.  Genitourinary: Positive for urgency and frequency. Negative for dysuria.      Allergies  Review of patient's allergies indicates no known allergies.  Home Medications  No current outpatient prescriptions on file. BP 117/78  Pulse 109  Temp(Src) 97.8 F (36.6 C) (Oral)  Resp 20  Ht 5\' 5"  (1.651 m)   Wt 215 lb (97.523 kg)  BMI 35.78 kg/m2  SpO2 94% Physical Exam  Nursing note and vitals reviewed. Constitutional: He is oriented to person, place, and time. He appears well-developed and well-nourished.  HENT:  Head: Normocephalic and atraumatic.  Eyes: Conjunctivae and EOM are normal. Pupils are equal, round, and reactive to light. Right eye exhibits no discharge. Left eye exhibits no discharge. No scleral icterus.  Neck: Normal range of motion. Neck supple. No JVD present.  Cardiovascular: Regular rhythm and normal heart sounds.  Exam reveals no gallop and no friction rub.   No murmur heard. Mildly tachycardic  Pulmonary/Chest: Effort normal and breath sounds normal. No respiratory distress. He has no wheezes. He has no rales. He exhibits no tenderness.  Abdominal: Soft. He exhibits no distension and no mass. There is no tenderness. There is no rebound and no guarding.  Genitourinary:  Uncircumcised male, penis is mildly erythematous, and remarkable for candidiasis  Musculoskeletal: Normal range of motion. He exhibits no edema and no tenderness.  Neurological: He is alert and oriented to person, place, and time.  Skin: Skin is warm and dry.     Psychiatric: He has a normal mood and affect. His behavior is normal. Judgment and thought content normal.    ED Course  Procedures (including critical care time) Results for orders placed during the hospital encounter of 08/15/13  URINALYSIS, ROUTINE W REFLEX MICROSCOPIC      Result Value Ref Range   Color, Urine YELLOW  YELLOW   APPearance CLEAR  CLEAR   Specific Gravity, Urine 1.045 (*) 1.005 - 1.030   pH 5.5  5.0 - 8.0   Glucose, UA >1000 (*) NEGATIVE mg/dL   Hgb urine dipstick NEGATIVE  NEGATIVE   Bilirubin Urine NEGATIVE  NEGATIVE   Ketones, ur 15 (*) NEGATIVE mg/dL   Protein, ur NEGATIVE  NEGATIVE mg/dL   Urobilinogen, UA 0.2  0.0 - 1.0 mg/dL   Nitrite NEGATIVE  NEGATIVE   Leukocytes, UA NEGATIVE  NEGATIVE  CBC WITH  DIFFERENTIAL      Result Value Ref Range   WBC 10.1  4.0 - 10.5 K/uL   RBC 5.38  4.22 - 5.81 MIL/uL   Hemoglobin 16.9  13.0 - 17.0 g/dL   HCT 82.948.5  56.239.0 - 13.052.0 %   MCV 90.1  78.0 - 100.0 fL   MCH 31.4  26.0 - 34.0 pg   MCHC 34.8  30.0 - 36.0 g/dL   RDW 86.513.6  78.411.5 - 69.615.5 %   Platelets 207  150 - 400 K/uL   Neutrophils Relative % 72  43 - 77 %   Neutro Abs 7.3  1.7 - 7.7 K/uL   Lymphocytes Relative 19  12 - 46 %   Lymphs Abs 1.9  0.7 - 4.0 K/uL   Monocytes Relative 8  3 - 12 %   Monocytes Absolute 0.8  0.1 - 1.0 K/uL   Eosinophils Relative 1  0 - 5 %   Eosinophils Absolute 0.1  0.0 - 0.7 K/uL   Basophils Relative 0  0 - 1 %   Basophils Absolute 0.0  0.0 - 0.1 K/uL  COMPREHENSIVE METABOLIC PANEL      Result Value Ref Range   Sodium 137  137 - 147 mEq/L   Potassium 4.6  3.7 - 5.3 mEq/L   Chloride 97  96 - 112 mEq/L   CO2 25  19 - 32 mEq/L   Glucose, Bld 690 (*) 70 - 99 mg/dL   BUN 20  6 - 23 mg/dL   Creatinine, Ser 2.950.80  0.50 - 1.35 mg/dL   Calcium 9.2  8.4 - 28.410.5 mg/dL   Total Protein 7.4  6.0 - 8.3 g/dL   Albumin 3.0 (*) 3.5 - 5.2 g/dL   AST 22  0 - 37 U/L   ALT 33  0 - 53 U/L   Alkaline Phosphatase 178 (*) 39 - 117 U/L   Total Bilirubin 0.3  0.3 - 1.2 mg/dL   GFR calc non Af Amer 90 (*) >90 mL/min   GFR calc Af Amer >90  >90 mL/min  URINE MICROSCOPIC-ADD ON      Result Value Ref Range   Squamous Epithelial / LPF RARE  RARE   Bacteria, UA RARE  RARE  CBG MONITORING, ED      Result Value Ref Range   Glucose-Capillary >600 (*) 70 - 99 mg/dL   Dg Tibia/fibula Left  08/15/2013   CLINICAL DATA:  Pain and swelling.  EXAM: LEFT TIBIA AND FIBULA - 2 VIEW  COMPARISON:  DG ANKLE COMPLETE*L* dated 08/15/2013; DG TIBIA/FIBULA*L* dated 03/19/2009; DG ANKLE COMPLETE*L* dated 03/19/2009; DG FOOT COMPLETE*L* dated 03/19/2009  FINDINGS: Proximal aspect of the intramedullary rod and proximal screws show stable alignment with no evidence of acute fracture. No abnormal lucency is seen  surrounding hardware. Deformity of the fibular diaphysis present. Midshaft tibial fracture has healed completely.  IMPRESSION: The proximal aspect of prior tibial ORIF appears stable.   Electronically Signed   By: Irish LackGlenn  Yamagata M.D.   On: 08/15/2013 22:19   Dg Ankle Complete Left  08/15/2013  CLINICAL DATA:  Left ankle pain.  EXAM: LEFT ANKLE COMPLETE - 3+ VIEW  COMPARISON:  DG TIBIA/FIBULA*L* dated 03/19/2009; DG ANKLE COMPLETE*L* dated 03/19/2009; DG FOOT COMPLETE*L* dated 03/19/2009  FINDINGS: Distal aspect of an intra medullary rod and distal tibial screws show retraction of the more superior transversely oriented tibial screw out of the bone compared to studies from 2010. The head of the screw now projects approximately 1.4 cm the on the cortex of the medial tibia and there does appear to be some overlying soft tissue swelling. No acute fractures identified. No bone destruction or abnormal bony lucency identified. Partially healed deformity of the fibular diaphysis noted.  IMPRESSION: Since prior studies in 2010, the superior of 2 separate transverse distal tibial screws has retracted out into the soft tissues by a roughly 14 mm beyond the cortex of the bone. This does cause overlying soft tissue swelling.   Electronically Signed   By: Irish Lack M.D.   On: 08/15/2013 22:15      EKG Interpretation None      MDM   Final diagnoses:  Diabetes  Cellulitis  Candidiasis    Patient with multiple complaints.  Check basic labs, and will reassess.  1. new-onset diabetes, glucose is 690, will treat with fluids and insulin 2. cellulitis to left lower extremity, will treat with IV vancomycin, and order plain films 3. candidiasis, will treat with Diflucan  Patient discussed with Dr. Alvester Morin, from the medicine team, who will admit the patient.    Roxy Horseman, PA-C 08/15/13 2258  Roxy Horseman, PA-C 08/15/13 2259

## 2013-08-15 NOTE — ED Provider Notes (Signed)
Medical screening examination/treatment/procedure(s) were performed by non-physician practitioner and as supervising physician I was immediately available for consultation/collaboration.   EKG Interpretation None        Rolan BuccoMelanie Litha Lamartina, MD 08/15/13 2313

## 2013-08-16 ENCOUNTER — Encounter (HOSPITAL_COMMUNITY): Payer: Self-pay | Admitting: Internal Medicine

## 2013-08-16 DIAGNOSIS — L0291 Cutaneous abscess, unspecified: Secondary | ICD-10-CM

## 2013-08-16 DIAGNOSIS — R030 Elevated blood-pressure reading, without diagnosis of hypertension: Secondary | ICD-10-CM

## 2013-08-16 DIAGNOSIS — R7309 Other abnormal glucose: Secondary | ICD-10-CM

## 2013-08-16 DIAGNOSIS — E119 Type 2 diabetes mellitus without complications: Secondary | ICD-10-CM

## 2013-08-16 DIAGNOSIS — L0390 Cellulitis, unspecified: Secondary | ICD-10-CM

## 2013-08-16 LAB — BASIC METABOLIC PANEL
BUN: 16 mg/dL (ref 6–23)
CO2: 24 mEq/L (ref 19–32)
Calcium: 8.5 mg/dL (ref 8.4–10.5)
Chloride: 103 mEq/L (ref 96–112)
Creatinine, Ser: 0.68 mg/dL (ref 0.50–1.35)
GFR calc Af Amer: >90 mL/min (ref >90)
GLUCOSE: 376 mg/dL — AB (ref 70–99)
POTASSIUM: 5 meq/L (ref 3.7–5.3)
SODIUM: 139 meq/L (ref 137–147)

## 2013-08-16 LAB — CBC WITH DIFFERENTIAL/PLATELET
BASOS PCT: 0 % (ref 0–1)
Basophils Absolute: 0 10*3/uL (ref 0.0–0.1)
EOS ABS: 0.1 10*3/uL (ref 0.0–0.7)
EOS PCT: 1 % (ref 0–5)
HEMATOCRIT: 41.8 % (ref 39.0–52.0)
Hemoglobin: 14.8 g/dL (ref 13.0–17.0)
LYMPHS ABS: 2.7 10*3/uL (ref 0.7–4.0)
Lymphocytes Relative: 24 % (ref 12–46)
MCH: 31.4 pg (ref 26.0–34.0)
MCHC: 35.4 g/dL (ref 30.0–36.0)
MCV: 88.6 fL (ref 78.0–100.0)
MONO ABS: 0.9 10*3/uL (ref 0.1–1.0)
Monocytes Relative: 8 % (ref 3–12)
NEUTROS ABS: 7.7 10*3/uL (ref 1.7–7.7)
NEUTROS PCT: 67 % (ref 43–77)
Platelets: 199 10*3/uL (ref 150–400)
RBC: 4.72 MIL/uL (ref 4.22–5.81)
RDW: 13.5 % (ref 11.5–15.5)
WBC: 11.4 10*3/uL — ABNORMAL HIGH (ref 4.0–10.5)

## 2013-08-16 LAB — GLUCOSE, CAPILLARY
GLUCOSE-CAPILLARY: 392 mg/dL — AB (ref 70–99)
Glucose-Capillary: 386 mg/dL — ABNORMAL HIGH (ref 70–99)
Glucose-Capillary: 210 mg/dL — ABNORMAL HIGH (ref 70–99)
Glucose-Capillary: 286 mg/dL — ABNORMAL HIGH (ref 70–99)
Glucose-Capillary: 340 mg/dL — ABNORMAL HIGH (ref 70–99)

## 2013-08-16 LAB — HEMOGLOBIN A1C
HEMOGLOBIN A1C: 12.4 % — AB (ref <5.7)
Mean Plasma Glucose: 309 mg/dL — ABNORMAL HIGH (ref <117)

## 2013-08-16 LAB — SEDIMENTATION RATE: SED RATE: 30 mm/h — AB (ref 0–16)

## 2013-08-16 MED ORDER — ACETAMINOPHEN 325 MG PO TABS
650.0000 mg | ORAL_TABLET | Freq: Four times a day (QID) | ORAL | Status: DC | PRN
  Administered 2013-08-16: 650 mg via ORAL
  Filled 2013-08-16: qty 2

## 2013-08-16 MED ORDER — ONDANSETRON HCL 4 MG/2ML IJ SOLN: 4.0000 mg | Freq: Four times a day (QID) | INTRAMUSCULAR | Status: DC | PRN

## 2013-08-16 MED ORDER — INSULIN ASPART 100 UNIT/ML ~~LOC~~ SOLN
0.0000 [IU] | Freq: Three times a day (TID) | SUBCUTANEOUS | Status: DC
  Administered 2013-08-16: 8 [IU] via SUBCUTANEOUS
  Administered 2013-08-17: 15 [IU] via SUBCUTANEOUS
  Administered 2013-08-17: 5 [IU] via SUBCUTANEOUS
  Administered 2013-08-17: 8 [IU] via SUBCUTANEOUS

## 2013-08-16 MED ORDER — VANCOMYCIN HCL 10 G IV SOLR
1250.0000 mg | Freq: Two times a day (BID) | INTRAVENOUS | Status: DC
  Administered 2013-08-16 – 2013-08-19 (×7): 1250 mg via INTRAVENOUS
  Filled 2013-08-16 (×10): qty 1250

## 2013-08-16 MED ORDER — INSULIN GLARGINE 100 UNIT/ML ~~LOC~~ SOLN
15.0000 [IU] | Freq: Every day | SUBCUTANEOUS | Status: DC
  Administered 2013-08-17 – 2013-08-21 (×4): 15 [IU] via SUBCUTANEOUS
  Filled 2013-08-16 (×5): qty 0.15

## 2013-08-16 MED ORDER — SODIUM CHLORIDE 0.9 % IV SOLN
INTRAVENOUS | Status: AC
  Administered 2013-08-16: 21:00:00 via INTRAVENOUS

## 2013-08-16 MED ORDER — ONDANSETRON HCL 4 MG PO TABS
4.0000 mg | ORAL_TABLET | Freq: Four times a day (QID) | ORAL | Status: DC | PRN
Start: 2013-08-16 — End: 2013-08-21

## 2013-08-16 MED ORDER — LISINOPRIL 10 MG PO TABS
10.0000 mg | ORAL_TABLET | Freq: Every day | ORAL | Status: DC
  Administered 2013-08-16 – 2013-08-17 (×2): 10 mg via ORAL
  Filled 2013-08-16 (×3): qty 1

## 2013-08-16 MED ORDER — INSULIN ASPART 100 UNIT/ML ~~LOC~~ SOLN
0.0000 [IU] | Freq: Three times a day (TID) | SUBCUTANEOUS | Status: DC
  Administered 2013-08-16: 9 [IU] via SUBCUTANEOUS
  Administered 2013-08-16: 3 [IU] via SUBCUTANEOUS

## 2013-08-16 MED ORDER — ENOXAPARIN SODIUM 40 MG/0.4ML ~~LOC~~ SOLN
40.0000 mg | SUBCUTANEOUS | Status: DC
  Administered 2013-08-16 – 2013-08-21 (×5): 40 mg via SUBCUTANEOUS
  Filled 2013-08-16 (×6): qty 0.4

## 2013-08-16 MED ORDER — INSULIN GLARGINE 100 UNIT/ML ~~LOC~~ SOLN
5.0000 [IU] | Freq: Every day | SUBCUTANEOUS | Status: DC
  Administered 2013-08-16: 5 [IU] via SUBCUTANEOUS
  Filled 2013-08-16: qty 0.05

## 2013-08-16 MED ORDER — ACETAMINOPHEN 650 MG RE SUPP: 650.0000 mg | Freq: Four times a day (QID) | RECTAL | Status: DC | PRN

## 2013-08-16 MED ORDER — CIPROFLOXACIN IN D5W 400 MG/200ML IV SOLN
400.0000 mg | Freq: Two times a day (BID) | INTRAVENOUS | Status: DC
  Administered 2013-08-16 – 2013-08-18 (×5): 400 mg via INTRAVENOUS
  Filled 2013-08-16 (×8): qty 200

## 2013-08-16 MED ORDER — INSULIN ASPART 100 UNIT/ML ~~LOC~~ SOLN
0.0000 [IU] | Freq: Every day | SUBCUTANEOUS | Status: DC
  Administered 2013-08-16: 4 [IU] via SUBCUTANEOUS

## 2013-08-16 NOTE — Progress Notes (Addendum)
[facility] PROGRESS NOTE  Louis Peterson [facility] NUU:[id number] DOB: 02-06-1946 DOA: 08/15/2013 PCP: Louis Dopp, PA-C History lightheaded by daughter-in-law [place] at bedside  Assessment/Plan: Left lower extremity cellulitis Continue empiric vancomycin and ciprofloxacin MRI of the left leg ordered on admission to rule out any osteomyelitis. ESR  elevated. If underlying hardware is an issue of getting the MRI we will check a CT scan with contrast. Monitor WBC and temperature  New onset diabetes mellitus with severe hyperglycemia Will place on Lantus 15 units at bedtime and sliding scale insulin for now. Continue IV hydration. Check A1c. Needs diabetic teaching and lifestyle modifications. Diabetic coordinator consult. Check lipid panel  Hypertension Will add daily lisinopril  Obesity Counseled on weight loss and exercise  Discussed with patient in detail. Was interpreted by his daughter-in-law at bedside  Code Status: Full code Family Communication: Daughter and daughter-in-law at bedside Disposition Plan: Home once cellulitis and blood glucose improved   Consultants:  None  Procedures:  none  Antibiotics:  IV vancomycin and ciprofloxacin 4/14>>  HPI/Subjective: Patient seen and examined this morning peaked admission H&P reviewed.   Objective: Filed Vitals:   08/16/13 0529  BP: 132/74  Pulse: 75  Temp: 98.2 F (36.8 C)  Resp: 18    Intake/Output Summary (Last 24 hours) at 08/16/13 1357 Last data filed at 08/16/13 0800  Gross per 24 hour  Intake      0 ml  Output    950 ml  Net   -950 ml   Filed Weights   08/15/13 1912 08/16/13 0034  Weight: 97.523 kg (215 lb) 97.251 kg (214 lb 6.4 oz)    Exam:   General:  Elderly obese male lying in bed in no acute distress  HEENT: No pallor, moist oral mucosa  Chest: Clear to auscultation bilaterally, no added sounds  CVS: Normal S1-S2, no murmurs rub or gallop  Abdomen: Soft, nontender,  nondistended, bowel sounds present  Extremities: Warmth and erythema over left lower leg with bulging hardware over the lower tibia. Non tender.  area of superficial skin breakdown over upper trivia. No fluctuation or discharge  CNS: AAO x3    Data Reviewed: Basic Metabolic Panel:  Recent Labs Lab 08/15/13 1953 08/16/13 0110  NA 137 139  K 4.6 5.0  CL 97 103  CO2 25 24  GLUCOSE 690* 376*  BUN 20 16  CREATININE 0.80 0.68  CALCIUM 9.2 8.5   Liver Function Tests:  Recent Labs Lab 08/15/13 1953  AST 22  ALT 33  ALKPHOS 178*  BILITOT 0.3  PROT 7.4  ALBUMIN 3.0*   No results found for this basename: LIPASE, AMYLASE,  in the last 168 hours No results found for this basename: AMMONIA,  in the last 168 hours CBC:  Recent Labs Lab 08/15/13 1953 08/16/13 0110  WBC 10.1 11.4*  NEUTROABS 7.3 7.7  HGB 16.9 14.8  HCT 48.5 41.8  MCV 90.1 88.6  PLT 207 199   Cardiac Enzymes: No results found for this basename: CKTOTAL, CKMB, CKMBINDEX, TROPONINI,  in the last 168 hours BNP (last 3 results) No results found for this basename: PROBNP,  in the last 8760 hours CBG:  Recent Labs Lab 08/15/13 1931 08/16/13 0108 08/16/13 0742 08/16/13 1127  GLUCAP >600* 386* 392* 210*    No results found for this or any previous visit (from the past 240 hour(s)).   Studies: Dg Tibia/fibula Left  08/15/2013   CLINICAL DATA:  Pain and swelling.  EXAM: LEFT TIBIA AND FIBULA -  2 VIEW  COMPARISON:  DG ANKLE COMPLETE*L* dated 08/15/2013; DG TIBIA/FIBULA*L* dated 03/19/2009; DG ANKLE COMPLETE*L* dated 03/19/2009; DG FOOT COMPLETE*L* dated 03/19/2009  FINDINGS: Proximal aspect of the intramedullary rod and proximal screws show stable alignment with no evidence of acute fracture. No abnormal lucency is seen surrounding hardware. Deformity of the fibular diaphysis present. Midshaft tibial fracture has healed completely.  IMPRESSION: The proximal aspect of prior tibial ORIF appears stable.    Electronically Signed   By: Aletta Edouard M.D.   On: 08/15/2013 22:19   Dg Ankle Complete Left  08/15/2013   CLINICAL DATA:  Left ankle pain.  EXAM: LEFT ANKLE COMPLETE - 3+ VIEW  COMPARISON:  DG TIBIA/FIBULA*L* dated 03/19/2009; DG ANKLE COMPLETE*L* dated 03/19/2009; DG FOOT COMPLETE*L* dated 03/19/2009  FINDINGS: Distal aspect of an intra medullary rod and distal tibial screws show retraction of the more superior transversely oriented tibial screw out of the bone compared to studies from 2010. The head of the screw now projects approximately 1.4 cm the on the cortex of the medial tibia and there does appear to be some overlying soft tissue swelling. No acute fractures identified. No bone destruction or abnormal bony lucency identified. Partially healed deformity of the fibular diaphysis noted.  IMPRESSION: Since prior studies in 2010, the superior of 2 separate transverse distal tibial screws has retracted out into the soft tissues by a roughly 14 mm beyond the cortex of the bone. This does cause overlying soft tissue swelling.   Electronically Signed   By: Aletta Edouard M.D.   On: 08/15/2013 22:15    Scheduled Meds: . ciprofloxacin  400 mg Intravenous Q12H  . enoxaparin (LOVENOX) injection  40 mg Subcutaneous Q24H  . insulin aspart  0-9 Units Subcutaneous TID WC  . insulin glargine  5 Units Subcutaneous Daily  . vancomycin  1,250 mg Intravenous Q12H   Continuous Infusions: . sodium chloride 100 mL/hr at 08/16/13 0543      Time spent: 25 minutes    Louis Peterson  [facility] Pager [phone]. If 7PM-7AM, please contact night-coverage at [email or web address], password [facility] 08/16/2013, 1:57 PM  LOS: 1 day

## 2013-08-16 NOTE — Progress Notes (Signed)
Inpatient Diabetes Program Recommendations  AACE/ADA: New Consensus Statement on Inpatient Glycemic Control (2013)  Target Ranges:  Prepandial:   less than 140 mg/dL      Peak postprandial:   less than 180 mg/dL (1-2 hours)      Critically ill patients:  140 - 180 mg/dL   Noted pt with hx of "glucose intolerance". Last HgbA1C was in 2011 at 6.3%) Please order a current HgbA1C. Inpatient Diabetes Program Recommendations Insulin - Basal: Noted pt on lantus 5 units daily. Glucose is in the 300's-400's. Recommend starting with at least 10-15 units (starting dose typically at 0.2 units/kg--19 units lantus).  Correction (SSI): Please increase to moderate tidwc. (May need to order q 4 hrs until controlleld)  Thank you, Lenor CoffinAnn Deniro Laymon, RN, CNS, Diabetes Coordinator [phone])
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
## 2013-08-17 LAB — GLUCOSE, CAPILLARY
GLUCOSE-CAPILLARY: 381 mg/dL — AB (ref 70–99)
GLUCOSE-CAPILLARY: 274 mg/dL — AB (ref 70–99)
GLUCOSE-CAPILLARY: 361 mg/dL — AB (ref 70–99)
Glucose-Capillary: 246 mg/dL — ABNORMAL HIGH (ref 70–99)

## 2013-08-17 LAB — CBG MONITORING, ED: Glucose-Capillary: 387 mg/dL — ABNORMAL HIGH (ref 70–99)

## 2013-08-17 MED ORDER — PNEUMOCOCCAL VAC POLYVALENT 25 MCG/0.5ML IJ INJ
0.5000 mL | INJECTION | INTRAMUSCULAR | Status: DC
  Filled 2013-08-17: qty 0.5

## 2013-08-17 MED ORDER — HYDROCODONE-ACETAMINOPHEN 5-325 MG PO TABS
1.0000 | ORAL_TABLET | ORAL | Status: DC | PRN
  Administered 2013-08-17 – 2013-08-20 (×9): 1 via ORAL
  Filled 2013-08-17 (×3): qty 1
  Filled 2013-08-17: qty 2
  Filled 2013-08-17 (×5): qty 1

## 2013-08-17 MED ORDER — LIVING WELL WITH DIABETES BOOK - IN SPANISH
Freq: Once | Status: AC
  Administered 2013-08-17: 15:00:00
  Filled 2013-08-17: qty 1

## 2013-08-17 MED ORDER — INSULIN ASPART 100 UNIT/ML ~~LOC~~ SOLN
0.0000 [IU] | SUBCUTANEOUS | Status: DC
  Administered 2013-08-17: 15 [IU] via SUBCUTANEOUS
  Administered 2013-08-18: 5 [IU] via SUBCUTANEOUS
  Administered 2013-08-18 (×2): 3 [IU] via SUBCUTANEOUS
  Administered 2013-08-18: 5 [IU] via SUBCUTANEOUS
  Administered 2013-08-19 (×2): 3 [IU] via SUBCUTANEOUS
  Filled 2013-08-17: qty 1

## 2013-08-17 MED ORDER — NYSTATIN 100000 UNIT/GM EX CREA
TOPICAL_CREAM | Freq: Two times a day (BID) | CUTANEOUS | Status: DC
  Administered 2013-08-17 – 2013-08-20 (×6): via TOPICAL
  Administered 2013-08-21: 1 via TOPICAL
  Filled 2013-08-17 (×2): qty 15

## 2013-08-17 MED ORDER — ALUM & MAG HYDROXIDE-SIMETH 200-200-20 MG/5ML PO SUSP: 30.0000 mL | Freq: Four times a day (QID) | ORAL | Status: DC | PRN

## 2013-08-17 MED ORDER — CHLORHEXIDINE GLUCONATE 4 % EX LIQD
60.0000 mL | Freq: Once | CUTANEOUS | Status: AC
  Administered 2013-08-18: 4 via TOPICAL
  Filled 2013-08-17: qty 60

## 2013-08-17 MED ORDER — DEXTROSE-NACL 5-0.45 % IV SOLN
100.0000 mL/h | INTRAVENOUS | Status: DC
  Administered 2013-08-18: 100 mL/h via INTRAVENOUS

## 2013-08-17 MED ORDER — ACETAMINOPHEN 500 MG PO TABS
1000.0000 mg | ORAL_TABLET | Freq: Once | ORAL | Status: AC
  Administered 2013-08-18: 1000 mg via ORAL
  Filled 2013-08-17: qty 2

## 2013-08-17 MED ORDER — POLYVINYL ALCOHOL 1.4 % OP SOLN
1.0000 [drp] | OPHTHALMIC | Status: DC | PRN
  Administered 2013-08-17: 1 [drp] via OPHTHALMIC
  Filled 2013-08-17: qty 15

## 2013-08-17 MED ORDER — BD GETTING STARTED TAKE HOME KIT: 1/2ML X 30G SYRINGES
1.0000 | Freq: Once | Status: AC
  Administered 2013-08-17: 1
  Filled 2013-08-17: qty 1

## 2013-08-17 NOTE — Progress Notes (Signed)
This NP called by Dr. Wandra Feinstein. Murphy of orthopedics who was consulted by [facility] for the pt's cellulitis, ? Infection. Dr. Eulah PontMurphy thinks pt has an abscess, pus pocket, around his old hardware. Dr. Eulah PontMurphy plans to take to OR tomorrow for debridement, culture, and removal of old hardware. Dr. Eulah PontMurphy thinks the pt does not have a skin infection and the discoloration is from vascular issues.  I'm holding the Lantus since he will be NPO. Made CBGs q4hrs with SSI until after pt is eating postop. Further orders per ortho.  Jimmye NormanKaren Kirby-Graham, NP
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
## 2013-08-17 NOTE — Progress Notes (Addendum)
Inpatient Diabetes Program Recommendations  AACE/ADA: New Consensus Statement on Inpatient Glycemic Control (2013)  Target Ranges:  Prepandial:   less than 140 mg/dL      Peak postprandial:   less than 180 mg/dL (1-2 hours)      Critically ill patients:  140 - 180 mg/dL     Results for ESPITIA TOLEDO, Subhan (MRN [id number]) as of 08/17/2013 15:24  Ref. Range 08/16/2013 07:42 08/16/2013 11:27 08/16/2013 16:41 08/16/2013 21:27  Glucose-Capillary Latest Range: 70-99 mg/dL 392 (H) 210 (H) 286 (H) 340 (H)    Results for ESPITIA TOLEDO, Dontrelle (MRN [id number]) as of 08/17/2013 15:24  Ref. Range 08/17/2013 07:17 08/17/2013 11:10  Glucose-Capillary Latest Range: 70-99 mg/dL 381 (H) 274 (H)    Results for ESPITIA TOLEDO, Momodou (MRN [id number]) as of 08/17/2013 15:24  Ref. Range 08/16/2013 01:10  Hemoglobin A1C Latest Range: <5.7 % 12.4 (H)    New diagnosis of DM this admission.    Spoke with pt about new diagnosis (used patient's son and his grandson to help translate).  Discussed A1C results with him and explained what an A1C is, basic pathophysiology of DM Type 2, basic home care, importance of checking CBGs and maintaining good CBG control to prevent long-term and short-term complications.  Reviewed signs and symptoms of hyperglycemia and hypoglycemia.  RNs to provide ongoing basic DM education at bedside with this patient.  Have ordered educational booklet, insulin starter kit, and DM videos.  Discussed basic nutritional concepts with patient.  Explained what foods have carbohydrates and encouraged patient to monitor his portion sizes and avoid sweetened beverages (juice, regular sodas, sweet tea, etc).  Have also ordered RD consult for diet education.  RNs to begin insulin education with pt as well.   Will follow Jeannine Johnston Fishel RN, MSN, CDE Diabetes Coordinator Inpatient Diabetes Program Team Pager: [phone] (8a-10p)   

## 2013-08-17 NOTE — Progress Notes (Signed)
[facility] PROGRESS NOTE  Louis Peterson [facility] KXF:[id number] DOB: 1945-08-18 DOA: 08/15/2013 PCP: Richardson Dopp, PA-C History lightheaded by daughter-in-law [place] at bedside  Assessment/Plan: Left lower extremity cellulitis Continue empiric vancomycin and ciprofloxacin ESR  elevated.  Monitor WBC and temperature Concern for localized abscess. Discussed with Dr Percell Miller will hold on getting MRI. Will get Ultrasound, possible aspiration.  Dr Percell Miller to see patient tonight.   New onset diabetes mellitus with severe hyperglycemia Lantus 15 units at bedtime and sliding scale insulin for now. Continue IV hydration. A1c at 12. . Needs diabetic teaching and lifestyle modifications. Diabetic coordinator consult.  Hypertension Started on  Lisinopril. Check Bmet in am.   Obesity Counseled on weight loss and exercise    Code Status: Full code Family Communication: Daughter and daughter-in-law at bedside Disposition Plan: Home once cellulitis and blood glucose improved    Consultants:  Dr Percell Miller  Procedures:  none  Antibiotics:  IV vancomycin and ciprofloxacin 4/14>>  HPI/Subjective: Feeling ok, mild pain lower extremities. Swelling and redness started last week.   Objective: Filed Vitals:   08/17/13 0447  BP: 126/66  Pulse: 83  Temp: 98.1 F (36.7 C)  Resp: 18    Intake/Output Summary (Last 24 hours) at 08/17/13 1029 Last data filed at 08/17/13 0843  Gross per 24 hour  Intake    990 ml  Output   1225 ml  Net   -235 ml   Filed Weights   08/15/13 1912 08/16/13 0034 08/17/13 0447  Weight: 97.523 kg (215 lb) 97.251 kg (214 lb 6.4 oz) 98.385 kg (216 lb 14.4 oz)    Exam:   General:  Elderly obese male lying in bed in no acute distress  HEENT: No pallor, moist oral mucosa  Chest: Clear to auscultation bilaterally, no added sounds  CVS: Normal S1-S2, no murmurs rub or gallop  Abdomen: Soft, nontender, nondistended, bowel sounds  present  Extremities: Warmth and erythema over left lower leg with bulging hardware over the lower tibia. Non tender.  area of superficial skin breakdown over upper tibia. Localized swelling and what appears to be localized abscess.   CNS: AAO x3    Data Reviewed: Basic Metabolic Panel:  Recent Labs Lab 08/15/13 1953 08/16/13 0110  NA 137 139  K 4.6 5.0  CL 97 103  CO2 25 24  GLUCOSE 690* 376*  BUN 20 16  CREATININE 0.80 0.68  CALCIUM 9.2 8.5   Liver Function Tests:  Recent Labs Lab 08/15/13 1953  AST 22  ALT 33  ALKPHOS 178*  BILITOT 0.3  PROT 7.4  ALBUMIN 3.0*   No results found for this basename: LIPASE, AMYLASE,  in the last 168 hours No results found for this basename: AMMONIA,  in the last 168 hours CBC:  Recent Labs Lab 08/15/13 1953 08/16/13 0110  WBC 10.1 11.4*  NEUTROABS 7.3 7.7  HGB 16.9 14.8  HCT 48.5 41.8  MCV 90.1 88.6  PLT 207 199   Cardiac Enzymes: No results found for this basename: CKTOTAL, CKMB, CKMBINDEX, TROPONINI,  in the last 168 hours BNP (last 3 results) No results found for this basename: PROBNP,  in the last 8760 hours CBG:  Recent Labs Lab 08/16/13 0742 08/16/13 1127 08/16/13 1641 08/16/13 2127 08/17/13 0717  GLUCAP 392* 210* 286* 340* 381*    No results found for this or any previous visit (from the past 240 hour(s)).   Studies: Dg Tibia/fibula Left  08/15/2013   CLINICAL DATA:  Pain and swelling.  EXAM:  LEFT TIBIA AND FIBULA - 2 VIEW  COMPARISON:  DG ANKLE COMPLETE*L* dated 08/15/2013; DG TIBIA/FIBULA*L* dated 03/19/2009; DG ANKLE COMPLETE*L* dated 03/19/2009; DG FOOT COMPLETE*L* dated 03/19/2009  FINDINGS: Proximal aspect of the intramedullary rod and proximal screws show stable alignment with no evidence of acute fracture. No abnormal lucency is seen surrounding hardware. Deformity of the fibular diaphysis present. Midshaft tibial fracture has healed completely.  IMPRESSION: The proximal aspect of prior tibial ORIF  appears stable.   Electronically Signed   By: Aletta Edouard M.D.   On: 08/15/2013 22:19   Dg Ankle Complete Left  08/15/2013   CLINICAL DATA:  Left ankle pain.  EXAM: LEFT ANKLE COMPLETE - 3+ VIEW  COMPARISON:  DG TIBIA/FIBULA*L* dated 03/19/2009; DG ANKLE COMPLETE*L* dated 03/19/2009; DG FOOT COMPLETE*L* dated 03/19/2009  FINDINGS: Distal aspect of an intra medullary rod and distal tibial screws show retraction of the more superior transversely oriented tibial screw out of the bone compared to studies from 2010. The head of the screw now projects approximately 1.4 cm the on the cortex of the medial tibia and there does appear to be some overlying soft tissue swelling. No acute fractures identified. No bone destruction or abnormal bony lucency identified. Partially healed deformity of the fibular diaphysis noted.  IMPRESSION: Since prior studies in 2010, the superior of 2 separate transverse distal tibial screws has retracted out into the soft tissues by a roughly 14 mm beyond the cortex of the bone. This does cause overlying soft tissue swelling.   Electronically Signed   By: Aletta Edouard M.D.   On: 08/15/2013 22:15    Scheduled Meds: . ciprofloxacin  400 mg Intravenous Q12H  . enoxaparin (LOVENOX) injection  40 mg Subcutaneous Q24H  . insulin aspart  0-15 Units Subcutaneous TID WC  . insulin aspart  0-5 Units Subcutaneous QHS  . insulin glargine  15 Units Subcutaneous Daily  . lisinopril  10 mg Oral Daily  . vancomycin  1,250 mg Intravenous Q12H   Continuous Infusions:      Time spent: 35 minutes    [facility] Pager [phone]. If 7PM-7AM, please contact night-coverage at [email or web address], password [facility] 08/17/2013, 10:29 AM  LOS: 2 days

## 2013-08-18 ENCOUNTER — Inpatient Hospital Stay (HOSPITAL_COMMUNITY): Payer: Medicare Other | Admitting: Certified Registered"

## 2013-08-18 ENCOUNTER — Encounter (HOSPITAL_COMMUNITY)
Admission: EM | Disposition: A | Discharge status: Home Health | Payer: Medicare Other | Source: Home / Self Care | Attending: Internal Medicine

## 2013-08-18 ENCOUNTER — Encounter (HOSPITAL_COMMUNITY): Payer: Self-pay | Admitting: Anesthesiology

## 2013-08-18 ENCOUNTER — Inpatient Hospital Stay (HOSPITAL_COMMUNITY): Payer: Medicare Other

## 2013-08-18 ENCOUNTER — Encounter (HOSPITAL_COMMUNITY): Payer: Medicare Other | Admitting: Certified Registered"

## 2013-08-18 DIAGNOSIS — Y831 Surgical operation with implant of artificial internal device as the cause of abnormal reaction of the patient, or of later complication, without mention of misadventure at the time of the procedure: Secondary | ICD-10-CM

## 2013-08-18 HISTORY — PX: HARDWARE REMOVAL: SHX979

## 2013-08-18 LAB — BASIC METABOLIC PANEL
BUN: 11 mg/dL (ref 6–23)
CO2: 28 mEq/L (ref 19–32)
Calcium: 8.7 mg/dL (ref 8.4–10.5)
Chloride: 104 mEq/L (ref 96–112)
Creatinine, Ser: 0.75 mg/dL (ref 0.50–1.35)
GLUCOSE: 116 mg/dL — AB (ref 70–99)
POTASSIUM: 3.3 meq/L — AB (ref 3.7–5.3)
Sodium: 142 mEq/L (ref 137–147)

## 2013-08-18 LAB — GLUCOSE, CAPILLARY
GLUCOSE-CAPILLARY: 302 mg/dL — AB (ref 70–99)
GLUCOSE-CAPILLARY: 212 mg/dL — AB (ref 70–99)
GLUCOSE-CAPILLARY: 166 mg/dL — AB (ref 70–99)
GLUCOSE-CAPILLARY: 242 mg/dL — AB (ref 70–99)
Glucose-Capillary: 116 mg/dL — ABNORMAL HIGH (ref 70–99)
Glucose-Capillary: 184 mg/dL — ABNORMAL HIGH (ref 70–99)
Glucose-Capillary: 195 mg/dL — ABNORMAL HIGH (ref 70–99)
Glucose-Capillary: 200 mg/dL — ABNORMAL HIGH (ref 70–99)
Glucose-Capillary: 204 mg/dL — ABNORMAL HIGH (ref 70–99)

## 2013-08-18 LAB — CBC
HCT: 42.5 % (ref 39.0–52.0)
HEMOGLOBIN: 14.5 g/dL (ref 13.0–17.0)
MCH: 30.7 pg (ref 26.0–34.0)
MCHC: 34.1 g/dL (ref 30.0–36.0)
MCV: 89.9 fL (ref 78.0–100.0)
Platelets: 180 10*3/uL (ref 150–400)
RBC: 4.73 MIL/uL (ref 4.22–5.81)
RDW: 13.3 % (ref 11.5–15.5)
WBC: 9.5 10*3/uL (ref 4.0–10.5)

## 2013-08-18 LAB — SURGICAL PCR SCREEN
MRSA, PCR: NEGATIVE
STAPHYLOCOCCUS AUREUS: NEGATIVE

## 2013-08-18 LAB — SEDIMENTATION RATE: SED RATE: 1 mm/h (ref 0–16)

## 2013-08-18 SURGERY — REMOVAL, HARDWARE
Anesthesia: General | Laterality: Left

## 2013-08-18 MED ORDER — OXYCODONE HCL 5 MG PO TABS: 5.0000 mg | ORAL_TABLET | Freq: Once | ORAL | Status: DC | PRN

## 2013-08-18 MED ORDER — LIDOCAINE HCL (CARDIAC) 20 MG/ML IV SOLN
INTRAVENOUS | Status: DC | PRN
  Administered 2013-08-18: 50 mg via INTRAVENOUS

## 2013-08-18 MED ORDER — NEOSTIGMINE METHYLSULFATE 1 MG/ML IJ SOLN
INTRAMUSCULAR | Status: DC | PRN
  Administered 2013-08-18: 4 mg via INTRAVENOUS

## 2013-08-18 MED ORDER — HYDROMORPHONE HCL PF 1 MG/ML IJ SOLN
INTRAMUSCULAR | Status: AC
  Administered 2013-08-18: 0.5 mg via INTRAVENOUS
  Filled 2013-08-18: qty 1

## 2013-08-18 MED ORDER — FENTANYL CITRATE 0.05 MG/ML IJ SOLN
INTRAMUSCULAR | Status: AC
  Filled 2013-08-18: qty 5

## 2013-08-18 MED ORDER — GLYCOPYRROLATE 0.2 MG/ML IJ SOLN
INTRAMUSCULAR | Status: DC | PRN
  Administered 2013-08-18: .6 mg via INTRAVENOUS

## 2013-08-18 MED ORDER — PROPOFOL 10 MG/ML IV BOLUS
INTRAVENOUS | Status: DC | PRN
  Administered 2013-08-18: 150 mg via INTRAVENOUS

## 2013-08-18 MED ORDER — ONDANSETRON HCL 4 MG/2ML IJ SOLN: 4.0000 mg | Freq: Four times a day (QID) | INTRAMUSCULAR | Status: DC | PRN

## 2013-08-18 MED ORDER — OXYCODONE HCL 5 MG/5ML PO SOLN: 5.0000 mg | Freq: Once | ORAL | Status: DC | PRN

## 2013-08-18 MED ORDER — LACTATED RINGERS IV SOLN
INTRAVENOUS | Status: DC | PRN
  Administered 2013-08-18 (×2): via INTRAVENOUS

## 2013-08-18 MED ORDER — FENTANYL CITRATE 0.05 MG/ML IJ SOLN
INTRAMUSCULAR | Status: DC | PRN
  Administered 2013-08-18: 100 ug via INTRAVENOUS
  Administered 2013-08-18: 50 ug via INTRAVENOUS
  Administered 2013-08-18: 100 ug via INTRAVENOUS

## 2013-08-18 MED ORDER — ROCURONIUM BROMIDE 100 MG/10ML IV SOLN
INTRAVENOUS | Status: DC | PRN
  Administered 2013-08-18: 50 mg via INTRAVENOUS

## 2013-08-18 MED ORDER — SODIUM CHLORIDE 0.9 % IR SOLN
Status: DC | PRN
  Administered 2013-08-18: 3000 mL

## 2013-08-18 MED ORDER — HYDROMORPHONE HCL PF 1 MG/ML IJ SOLN
0.2500 mg | INTRAMUSCULAR | Status: DC | PRN
  Administered 2013-08-18 (×3): 0.5 mg via INTRAVENOUS

## 2013-08-18 MED ORDER — ONDANSETRON HCL 4 MG/2ML IJ SOLN
INTRAMUSCULAR | Status: DC | PRN
  Administered 2013-08-18: 4 mg via INTRAVENOUS

## 2013-08-18 MED ORDER — CEFTRIAXONE SODIUM 2 G IJ SOLR
2.0000 g | INTRAMUSCULAR | Status: DC
  Administered 2013-08-18: 2 g via INTRAVENOUS
  Filled 2013-08-18 (×2): qty 2

## 2013-08-18 MED ORDER — PHENYLEPHRINE HCL 10 MG/ML IJ SOLN
INTRAMUSCULAR | Status: DC | PRN
  Administered 2013-08-18: 80 ug via INTRAVENOUS
  Administered 2013-08-18 (×2): 40 ug via INTRAVENOUS

## 2013-08-18 MED ORDER — 0.9 % SODIUM CHLORIDE (POUR BTL) OPTIME
TOPICAL | Status: DC | PRN
  Administered 2013-08-18: 1000 mL

## 2013-08-18 MED ORDER — MIDAZOLAM HCL 2 MG/2ML IJ SOLN
INTRAMUSCULAR | Status: AC
  Filled 2013-08-18: qty 2

## 2013-08-18 MED ORDER — LACTATED RINGERS IV SOLN
INTRAVENOUS | Status: DC
  Administered 2013-08-18 – 2013-08-19 (×2): via INTRAVENOUS

## 2013-08-18 MED ORDER — INSULIN ASPART 100 UNIT/ML ~~LOC~~ SOLN
6.0000 [IU] | Freq: Once | SUBCUTANEOUS | Status: AC
  Administered 2013-08-18: 12:00:00 via SUBCUTANEOUS

## 2013-08-18 MED ORDER — CEFAZOLIN SODIUM-DEXTROSE 2-3 GM-% IV SOLR
2.0000 g | Freq: Four times a day (QID) | INTRAVENOUS | Status: AC
  Administered 2013-08-18 – 2013-08-19 (×3): 2 g via INTRAVENOUS
  Filled 2013-08-18 (×3): qty 50

## 2013-08-18 MED ORDER — POTASSIUM CHLORIDE 10 MEQ/100ML IV SOLN
10.0000 meq | INTRAVENOUS | Status: AC
  Administered 2013-08-18: 10 meq via INTRAVENOUS
  Filled 2013-08-18 (×3): qty 100

## 2013-08-18 MED ORDER — MIDAZOLAM HCL 5 MG/5ML IJ SOLN
INTRAMUSCULAR | Status: DC | PRN
  Administered 2013-08-18: 2 mg via INTRAVENOUS

## 2013-08-18 MED ORDER — CEFAZOLIN SODIUM-DEXTROSE 2-3 GM-% IV SOLR
INTRAVENOUS | Status: DC | PRN
  Administered 2013-08-18: 2 g via INTRAVENOUS

## 2013-08-18 MED ORDER — CEFAZOLIN SODIUM-DEXTROSE 2-3 GM-% IV SOLR
INTRAVENOUS | Status: AC
  Filled 2013-08-18: qty 50

## 2013-08-18 MED ORDER — PROPOFOL 10 MG/ML IV BOLUS
INTRAVENOUS | Status: AC
  Filled 2013-08-18: qty 20

## 2013-08-18 SURGICAL SUPPLY — 66 items
BANDAGE ELASTIC 4 VELCRO ST LF (GAUZE/BANDAGES/DRESSINGS) ×3 IMPLANT
BANDAGE ELASTIC 6 VELCRO ST LF (GAUZE/BANDAGES/DRESSINGS) ×3 IMPLANT
BANDAGE GAUZE ELAST BULKY 4 IN (GAUZE/BANDAGES/DRESSINGS) ×3 IMPLANT
BLADE SURG 10 STRL SS (BLADE) ×3 IMPLANT
BNDG COHESIVE 4X5 TAN STRL (GAUZE/BANDAGES/DRESSINGS) ×3 IMPLANT
CHLORAPREP W/TINT 26ML (MISCELLANEOUS) ×1 IMPLANT
CONT SPEC 4OZ CLIKSEAL STRL BL (MISCELLANEOUS) ×4 IMPLANT
COVER MAYO STAND STRL (DRAPES) ×3 IMPLANT
COVER SURGICAL LIGHT HANDLE (MISCELLANEOUS) ×6 IMPLANT
CUFF TOURNIQUET SINGLE 34IN LL (TOURNIQUET CUFF) IMPLANT
CUFF TOURNIQUET SINGLE 44IN (TOURNIQUET CUFF) IMPLANT
DRAPE C-ARM 42X72 X-RAY (DRAPES) ×3 IMPLANT
DRAPE ORTHO SPLIT 77X108 STRL (DRAPES) ×3
DRAPE PROXIMA HALF (DRAPES) ×3 IMPLANT
DRAPE SURG ORHT 6 SPLT 77X108 (DRAPES) IMPLANT
DRESSING OPSITE X SMALL 2X3 (GAUZE/BANDAGES/DRESSINGS) ×1 IMPLANT
DRSG ADAPTIC 3X8 NADH LF (GAUZE/BANDAGES/DRESSINGS) ×1 IMPLANT
DRSG TEGADERM 4X4.75 (GAUZE/BANDAGES/DRESSINGS) ×2 IMPLANT
DURAPREP 26ML APPLICATOR (WOUND CARE) ×2 IMPLANT
ELECT CAUTERY BLADE 6.4 (BLADE) ×3 IMPLANT
ELECT REM PT RETURN 9FT ADLT (ELECTROSURGICAL) ×3
ELECTRODE REM PT RTRN 9FT ADLT (ELECTROSURGICAL) ×1 IMPLANT
GLOVE BIO SURGEON STRL SZ 6.5 (GLOVE) ×1 IMPLANT
GLOVE BIO SURGEON STRL SZ7.5 (GLOVE) ×7 IMPLANT
GLOVE BIO SURGEON STRL SZ8.5 (GLOVE) ×3 IMPLANT
GLOVE BIO SURGEONS STRL SZ 6.5 (GLOVE) ×1
GLOVE BIOGEL PI IND STRL 7.0 (GLOVE) IMPLANT
GLOVE BIOGEL PI IND STRL 7.5 (GLOVE) IMPLANT
GLOVE BIOGEL PI IND STRL 8 (GLOVE) ×2 IMPLANT
GLOVE BIOGEL PI INDICATOR 7.0 (GLOVE) ×6
GLOVE BIOGEL PI INDICATOR 7.5 (GLOVE) ×2
GLOVE BIOGEL PI INDICATOR 8 (GLOVE) ×4
GLOVE ECLIPSE 6.5 STRL STRAW (GLOVE) ×2 IMPLANT
GLOVE ORTHO TXT STRL SZ7.5 (GLOVE) ×3 IMPLANT
GLOVE SKINSENSE NS SZ7.0 (GLOVE) ×2
GLOVE SKINSENSE STRL SZ7.0 (GLOVE) IMPLANT
GOWN STRL REUS W/ TWL LRG LVL3 (GOWN DISPOSABLE) ×2 IMPLANT
GOWN STRL REUS W/TWL LRG LVL3 (GOWN DISPOSABLE) ×21
KIT BASIN OR (CUSTOM PROCEDURE TRAY) ×3 IMPLANT
KIT ROOM TURNOVER OR (KITS) ×3 IMPLANT
MANIFOLD NEPTUNE II (INSTRUMENTS) ×3 IMPLANT
NDL HYPO 21X1.5 SAFETY (NEEDLE) IMPLANT
NEEDLE HYPO 21X1.5 SAFETY (NEEDLE) IMPLANT
NS IRRIG 1000ML POUR BTL (IV SOLUTION) ×3 IMPLANT
PACK ORTHO EXTREMITY (CUSTOM PROCEDURE TRAY) ×3 IMPLANT
PAD ARMBOARD 7.5X6 YLW CONV (MISCELLANEOUS) ×6 IMPLANT
PAD CAST 4YDX4 CTTN HI CHSV (CAST SUPPLIES) ×1 IMPLANT
PADDING CAST COTTON 4X4 STRL (CAST SUPPLIES) ×3
SPONGE GAUZE 4X4 12PLY (GAUZE/BANDAGES/DRESSINGS) ×3 IMPLANT
SPONGE LAP 18X18 X RAY DECT (DISPOSABLE) ×3 IMPLANT
STAPLER VISISTAT 35W (STAPLE) ×3 IMPLANT
SUCTION FRAZIER TIP 10 FR DISP (SUCTIONS) ×2 IMPLANT
SUT MON AB 2-0 CT1 27 (SUTURE) ×3 IMPLANT
SUT VIC AB 0 CT1 27 (SUTURE) ×3
SUT VIC AB 0 CT1 27XBRD ANBCTR (SUTURE) ×1 IMPLANT
SUT VIC AB 2-0 CT1 27 (SUTURE)
SUT VIC AB 2-0 CT1 TAPERPNT 27 (SUTURE) IMPLANT
SYR BULB IRRIGATION 50ML (SYRINGE) ×3 IMPLANT
SYR CONTROL 10ML LL (SYRINGE) IMPLANT
TOWEL OR 17X24 6PK STRL BLUE (TOWEL DISPOSABLE) ×3 IMPLANT
TOWEL OR 17X26 10 PK STRL BLUE (TOWEL DISPOSABLE) ×3 IMPLANT
TOWEL OR NON WOVEN STRL DISP B (DISPOSABLE) ×3 IMPLANT
TUBE CONNECTING 12'X1/4 (SUCTIONS) ×1
TUBE CONNECTING 12X1/4 (SUCTIONS) ×2 IMPLANT
WATER STERILE IRR 1000ML POUR (IV SOLUTION) ×3 IMPLANT
YANKAUER SUCT BULB TIP NO VENT (SUCTIONS) ×3 IMPLANT

## 2013-08-18 NOTE — Care Management Note (Signed)
    Page 1 of 2   08/21/2013     5:56:02 PM CARE MANAGEMENT NOTE 08/21/2013  Patient:  Louis Peterson, Louis Peterson   Account Number:  [id number]  Date Initiated:  08/18/2013  Documentation initiated by:  GRAVES-BIGELOW,BRENDA  Subjective/Objective Assessment:   Pt admitted for Left leg pain and swelling and increasing urination. Left leg cellulitis and abscess at tibial nail screws.  Pt planned for  I&D hardware removal  08-18-13.     Action/Plan:   Pt has family support. CM did speak to daughter on yesterday to verify if pt has medicaid and was unable to do so. Pt has Medicare A&B. Unsure of Part D for Rx coverage. Pt will benefit for f/u at [facility] for PCP and med management.   Anticipated DC Date:  08/21/2013   Anticipated DC Plan:  [place]  CM consult  Follow-up appt scheduled      [facility] Choice  HOME HEALTH   Choice offered to / List presented to:  C-3 Spouse        HH arranged  HH-1 RN  IV Antibiotics      [place].   Status of service:  Completed, signed off Medicare Important Message given?   (If response is "NO", the following Medicare IM given date fields will be blank) Date Medicare IM given:   Date Additional Medicare IM given:    Discharge Disposition:  [place]  Per UR Regulation:  Reviewed for med. necessity/level of care/duration of stay  If discussed at [place] of Stay Meetings, dates discussed:    Comments:  08/21/13 11:15 CM received a call from [place] requesting I speak with pt concerning  a payment of 105.00 to be paid up front and whether or not he is able to make this payment.  CM met with pt and pt's daughter and discussed the payment and daughter and pt state they are able to afford payment.  CM spoke with RN and family as to their understanding of length of IV ABX and both parties understand it to be for at least 4 weeks.  Duration of IV ABX necessary for  staffing and medication delivery and relayed their understanding to [place].  RN is to run the 14:00 dose of ABX then the [facility] will be later this evening.  Pharmacy aware.  No other CM needs were communicated.  [name], BSN, [name] [phone].   08/19/13 11:17am [name], RN BSN Case Manager [phone] CM arranged appointment at [facility] and [facility] for patient to get established with PCP. Friday, April 24,2015 at 5:00pm with Chari Manning, NP.    08/19/13 10:45am [name], RN BSN Case Manager [phone] CM spooke with patient and daughter concerning need for home health RN for PICC line care and IV antibiotics. Choice offered to daughter.Referral called to [place] , [place]. Patient will be staying with his daughter- [name]  [place], [place] conatc # [phone].   08-18-13 1237 CM will continue to monitor for dispositon needs. [name], RN,BSN [phone]

## 2013-08-18 NOTE — Anesthesia Procedure Notes (Signed)
Procedure Name: Intubation Date/Time: 08/18/2013 1:14 PM Performed by: Brien MatesMAHONY, Rahman Ferrall D Pre-anesthesia Checklist: Patient identified, Emergency Drugs available, Suction available, Patient being monitored and Timeout performed Patient Re-evaluated:Patient Re-evaluated prior to inductionOxygen Delivery Method: Circle system utilized Preoxygenation: Pre-oxygenation with 100% oxygen Intubation Type: IV induction Ventilation: Mask ventilation without difficulty Laryngoscope Size: Miller and 2 Grade View: Grade I Tube type: Oral Tube size: 7.5 mm Number of attempts: 1 Airway Equipment and Method: Stylet Placement Confirmation: ETT inserted through vocal cords under direct vision,  positive ETCO2 and breath sounds checked- equal and bilateral Secured at: 22 cm Tube secured with: Tape Dental Injury: Teeth and Oropharynx as per pre-operative assessment

## 2013-08-18 NOTE — Anesthesia Postprocedure Evaluation (Signed)
Anesthesia Post Note  Patient: Louis Peterson  Procedure(s) Performed: Procedure(s) (LRB): HARDWARE REMOVAL Left Tibial Nail (Left)  Anesthesia type: General  Patient location: PACU  Post pain: Pain level controlled and Adequate analgesia  Post assessment: Post-op Vital signs reviewed, Patient's Cardiovascular Status Stable, Respiratory Function Stable, Patent Airway and Pain level controlled  Last Vitals:  Filed Vitals:   08/18/13 1445  BP: 147/80  Pulse: 88  Temp:   Resp: 17    Post vital signs: Reviewed and stable  Level of consciousness: awake, alert  and oriented  Complications: No apparent anesthesia complications

## 2013-08-18 NOTE — Consult Note (Addendum)
[facility]  Total days of antibiotics 4        Day 4 vanco        Day 4 cipro               Reason for Consult:osteo/hw infection left tibia    Referring Physician: murphy  Principal Problem:   Cellulitis Active Problems:   Hyperglycemia   Elevated blood pressure   Type II or unspecified type diabetes mellitus without mention of complication, uncontrolled  This information has been extracted from his chart and talking to his daughter since unable to get translator  HPI: Louis Peterson is a 68 y.o. male with DM who has prior hx of having ORIF with tibial nails placed in 2010 in [place]. He was admitted on 4/14 from  increasing pain and darkness of the skin the left lower extremity as well as drainage over the last many weeks which has worsened last few days. He reported that he noticed hardware coming out from his skin in the last 10 days. Xrays show loosening of hardware concerning for infection. No report of fever or chills. He was seen by Dr. Percell Miller who took him to the OR for removal of hardware (tibial nail) with healed fracture and wash out of possible abscess at site of tibial nail. OR note described the purulent fluid immediately drained and was clearly under pressure and tracked down to the distal interlock screw there were no other additional pockets.the first distal screw was removed I then made a second incision just distal instrument the second interlock screw that was actually outside of the nail but in the bone. No additional pus pockets found. Dr. Percell Miller reports removing all hardware and sending fluid for culture. Patient was started on IV antibiotics of vancomycin plus cipro(for possible UTI) x 4 days at time of surgery. Ua clear on admit, no cultures.   Past Medical History  Diagnosis Date  . Enlarged prostate     Allergies: No Known Allergies   MEDICATIONS: . ceFAZolin      .  ceFAZolin (ANCEF) IV  2 g Intravenous Q6H  .  ciprofloxacin  400 mg Intravenous Q12H  . enoxaparin (LOVENOX) injection  40 mg Subcutaneous Q24H  . insulin aspart  0-15 Units Subcutaneous Q4H  . insulin glargine  15 Units Subcutaneous Daily  . nystatin cream   Topical BID  . pneumococcal 23 valent vaccine  0.5 mL Intramuscular Tomorrow-1000  . vancomycin  1,250 mg Intravenous Q12H    History  Substance Use Topics  . Smoking status: Never Smoker   . Smokeless tobacco: Not on file  . Alcohol Use: No    Family History  Problem Relation Age of Onset  . Diabetes Mellitus II Neg Hx   . CAD Neg Hx     Review of Systems  Constitutional: Negative for fever, chills, diaphoresis, activity change, appetite change, fatigue and unexpected weight change.  HENT: Negative for congestion, sore throat, rhinorrhea, sneezing, trouble swallowing and sinus pressure.  Eyes: Negative for photophobia and visual disturbance.  Respiratory: Negative for cough, chest tightness, shortness of breath, wheezing and stridor.  Cardiovascular: Negative for chest pain, palpitations and leg swelling.  Gastrointestinal: Negative for nausea, vomiting, abdominal pain, diarrhea, constipation, blood in stool, abdominal distention and anal bleeding.  Genitourinary: Negative for dysuria, hematuria, flank pain and difficulty urinating.  Musculoskeletal: Negative for myalgias, back pain, joint swelling, arthralgias and gait problem.  Skin: per hpi Neurological: Negative for dizziness,  tremors, weakness and light-headedness.  Hematological: Negative for adenopathy. Does not bruise/bleed easily.  Psychiatric/Behavioral: Negative for behavioral problems, confusion, sleep disturbance, dysphoric mood, decreased concentration and agitation.     OBJECTIVE: Temp:  [97.6 F (36.4 C)-98.5 F (36.9 C)] 97.6 F (36.4 C) (04/16 1555) Pulse Rate:  [75-102] 99 (04/16 1555) Resp:  [14-29] 16 (04/16 1555) BP: (109-151)/(72-92) 147/86 mmHg (04/16 1555) SpO2:  [94 %-99 %] 97 %  (04/16 1555) Weight:  [216 lb 10.6 oz (98.277 kg)] 216 lb 10.6 oz (98.277 kg) (04/16 0400)  Constitutional: He is oriented to person, place, and time. He appears well-developed and well-nourished. No distress.  HENT:  Mouth/Throat: Oropharynx is clear and moist. No oropharyngeal exudate.  Cardiovascular: Normal rate, regular rhythm and normal heart sounds. Exam reveals no gallop and no friction rub.  No murmur heard.  Pulmonary/Chest: Effort normal and breath sounds normal. No respiratory distress. He has no wheezes.  Abdominal: Soft. Bowel sounds are normal. He exhibits no distension. There is no tenderness.  Lymphadenopathy:  He has no cervical adenopathy.  Neurological: He is alert and oriented to person, place, and time.  Skin: Skin is warm and dry. No rash noted. No erythema.  Ext: left leg is wrapped post operatively Psychiatric: He has a normal mood and affect. His behavior is normal.    LABS: Results for orders placed during the hospital encounter of 08/15/13 (from the past 48 hour(s))  GLUCOSE, CAPILLARY     Status: Abnormal   Collection Time    08/16/13  9:27 PM      Result Value Ref Range   Glucose-Capillary 340 (*) 70 - 99 mg/dL   Comment 1 Notify RN    GLUCOSE, CAPILLARY     Status: Abnormal   Collection Time    08/17/13  7:17 AM      Result Value Ref Range   Glucose-Capillary 381 (*) 70 - 99 mg/dL  GLUCOSE, CAPILLARY     Status: Abnormal   Collection Time    08/17/13 11:10 AM      Result Value Ref Range   Glucose-Capillary 274 (*) 70 - 99 mg/dL  GLUCOSE, CAPILLARY     Status: Abnormal   Collection Time    08/17/13  4:32 PM      Result Value Ref Range   Glucose-Capillary 246 (*) 70 - 99 mg/dL  GLUCOSE, CAPILLARY     Status: Abnormal   Collection Time    08/17/13  8:39 PM      Result Value Ref Range   Glucose-Capillary 361 (*) 70 - 99 mg/dL   Comment 1 Notify RN    GLUCOSE, CAPILLARY     Status: Abnormal   Collection Time    08/18/13 12:10 AM      Result  Value Ref Range   Glucose-Capillary 200 (*) 70 - 99 mg/dL   Comment 1 Notify RN    BASIC METABOLIC PANEL     Status: Abnormal   Collection Time    08/18/13  3:06 AM      Result Value Ref Range   Sodium 142  137 - 147 mEq/L   Potassium 3.3 (*) 3.7 - 5.3 mEq/L   Chloride 104  96 - 112 mEq/L   CO2 28  19 - 32 mEq/L   Glucose, Bld 116 (*) 70 - 99 mg/dL   BUN 11  6 - 23 mg/dL   Creatinine, Ser 0.75  0.50 - 1.35 mg/dL   Calcium 8.7  8.4 - 10.5 mg/dL  GFR calc non Af Amer >90  >90 mL/min   GFR calc Af Amer >90  >90 mL/min   Comment: (NOTE)     The eGFR has been calculated using the CKD EPI equation.     This calculation has not been validated in all clinical situations.     eGFR's persistently <90 mL/min signify possible Chronic Kidney     Disease.  CBC     Status: None   Collection Time    08/18/13  3:06 AM      Result Value Ref Range   WBC 9.5  4.0 - 10.5 K/uL   RBC 4.73  4.22 - 5.81 MIL/uL   Hemoglobin 14.5  13.0 - 17.0 g/dL   HCT 42.5  39.0 - 52.0 %   MCV 89.9  78.0 - 100.0 fL   MCH 30.7  26.0 - 34.0 pg   MCHC 34.1  30.0 - 36.0 g/dL   RDW 13.3  11.5 - 15.5 %   Platelets 180  150 - 400 K/uL  GLUCOSE, CAPILLARY     Status: Abnormal   Collection Time    08/18/13  4:19 AM      Result Value Ref Range   Glucose-Capillary 116 (*) 70 - 99 mg/dL   Comment 1 Notify RN    SURGICAL PCR SCREEN     Status: None   Collection Time    08/18/13  6:57 AM      Result Value Ref Range   MRSA, PCR NEGATIVE  NEGATIVE   Staphylococcus aureus NEGATIVE  NEGATIVE   Comment:            The Xpert SA Assay (FDA     approved for NASAL specimens     in patients over 38 years of age),     is one component of     a comprehensive surveillance     program.  Test performance has     been validated by [employer] for patients greater     than or equal to 74 year old.     It is not intended     to diagnose infection nor to     guide or monitor treatment.  GLUCOSE, CAPILLARY     Status: Abnormal    Collection Time    08/18/13  7:36 AM      Result Value Ref Range   Glucose-Capillary 184 (*) 70 - 99 mg/dL  GLUCOSE, CAPILLARY     Status: Abnormal   Collection Time    08/18/13 10:53 AM      Result Value Ref Range   Glucose-Capillary 302 (*) 70 - 99 mg/dL  GLUCOSE, CAPILLARY     Status: Abnormal   Collection Time    08/18/13 12:44 PM      Result Value Ref Range   Glucose-Capillary 212 (*) 70 - 99 mg/dL  GLUCOSE, CAPILLARY     Status: Abnormal   Collection Time    08/18/13  2:22 PM      Result Value Ref Range   Glucose-Capillary 166 (*) 70 - 99 mg/dL    MICRO: 4/16 fluid cx pending IMAGING: Dg Tibia/fibula Left  08/18/2013   CLINICAL DATA:  Hardware removal  EXAM: LEFT TIBIA AND FIBULA - 2 VIEW  COMPARISON:  Portable exam 1523 hr compared to the intraoperative images of 08/18/2013  FINDINGS: Pain tracks from prior hardware noted in tibia.  Healed oblique fracture of the middle third left tibia.  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  Osseous demineralization.  No additional focal bony abnormality seen.  IMPRESSION: No acute abnormalities as above.  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   Electronically Signed   By: Lavonia Dana M.D.   On: 08/18/2013 16:18   Dg Tibia/fibula Left  08/18/2013   CLINICAL DATA:  Hardware removed  EXAM: LEFT TIBIA AND FIBULA - 2 VIEW  COMPARISON:  DG TIBIA/FIBULA*L* dated 08/15/2013  FINDINGS: The intra medullary rod has been removed from the tibia. The appearance of the tibia and distal fibular fracture is stable.  IMPRESSION: Removal of the intra medullary rod from the tibia.   Electronically Signed   By: Maryclare Bean M.D.   On: 08/18/2013 16:05   Dg C-arm 1-60 Min  08/18/2013   CLINICAL DATA:  Hardware removal  EXAM: DG C-ARM 1-60 MIN  COMPARISON:  DG TIBIA/FIBULA*L* dated 08/15/2013  FINDINGS: The intra medullary rod has been removed within the tibia.  The appearance of the tibia and fibula is stable.  IMPRESSION: Removal of the intra medullary rod from the tibia.   Electronically Signed   By: Maryclare Bean M.D.   On: 08/18/2013 16:04   Assessment/Plan:  68yo M with diabetes presents with hardware infection from prior ORIF to left tibia s/p HW removal. On vanco  - will add ceftriaxone to vanco and d/c cipro - await culture results to target therapy - will need 6 wk IV therapy and treat as osteomyelitis of tibia - will add on sed rate and crp - for health maintenance, will check hiv, rpr, hcv  Breylin Dom B. [facility] [phone]

## 2013-08-18 NOTE — Progress Notes (Signed)
[facility] PROGRESS NOTE  Louis Peterson [facility] MVV:[id number] DOB: 09/16/1945 DOA: 08/15/2013 PCP: No primary provider on file. History lightheaded by daughter-in-law [place] at bedside  Assessment/Plan: Left lower extremity cellulitis, hardware infection tibial nail.  Continue empiric vancomycin and ciprofloxacin ESR  elevated.  Monitor WBC and temperature Appreciate Dr Percell Miller assistance.  Plan to proceed with hardware removal, abscess drainage.  Will order arterial doppler.   New onset diabetes mellitus with severe hyperglycemia Continue IV hydration. A1c at 12. . Needs diabetic teaching and lifestyle modifications. Diabetic coordinator consult. Continue with SSI. Hold lantus due to NPO status.   Hypertension Started on  Lisinopril.   Hypokalemia; replete IV.   Obesity Counseled on weight loss and exercise    Code Status: Full code Family Communication: Daughter and daughter-in-law at bedside Disposition Plan: Home once cellulitis and blood glucose improved    Consultants:  Dr Percell Miller  Procedures:  none  Antibiotics:  IV vancomycin and ciprofloxacin 4/14>>  HPI/Subjective: Denies chest pain or dyspnea. He is aware plan of care.   Objective: Filed Vitals:   08/18/13 0400  BP: 109/72  Pulse: 75  Temp: 98.1 F (36.7 C)  Resp: 20    Intake/Output Summary (Last 24 hours) at 08/18/13 0732 Last data filed at 08/17/13 3005  Gross per 24 hour  Intake    240 ml  Output      0 ml  Net    240 ml   Filed Weights   08/16/13 0034 08/17/13 0447 08/18/13 0400  Weight: 97.251 kg (214 lb 6.4 oz) 98.385 kg (216 lb 14.4 oz) 98.277 kg (216 lb 10.6 oz)    Exam:   General:  Elderly obese male lying in bed in no acute distress  HEENT: No pallor, moist oral mucosa  Chest: Clear to auscultation bilaterally, no added sounds  CVS: Normal S1-S2, no murmurs rub or gallop  Abdomen: Soft, nontender, nondistended, bowel sounds present  Extremities: Warmth  and erythema over left lower leg with bulging hardware over the lower tibia.  area of superficial skin breakdown over upper tibia. Localized swelling and what appears to be localized abscess.   CNS: AAO x3    Data Reviewed: Basic Metabolic Panel:  Recent Labs Lab 08/15/13 1953 08/16/13 0110 08/18/13 0306  NA 137 139 142  K 4.6 5.0 3.3*  CL 97 103 104  CO2 25 24 28   GLUCOSE 690* 376* 116*  BUN 20 16 11   CREATININE 0.80 0.68 0.75  CALCIUM 9.2 8.5 8.7   Liver Function Tests:  Recent Labs Lab 08/15/13 1953  AST 22  ALT 33  ALKPHOS 178*  BILITOT 0.3  PROT 7.4  ALBUMIN 3.0*   No results found for this basename: LIPASE, AMYLASE,  in the last 168 hours No results found for this basename: AMMONIA,  in the last 168 hours CBC:  Recent Labs Lab 08/15/13 1953 08/16/13 0110 08/18/13 0306  WBC 10.1 11.4* 9.5  NEUTROABS 7.3 7.7  --   HGB 16.9 14.8 14.5  HCT 48.5 41.8 42.5  MCV 90.1 88.6 89.9  PLT 207 199 180   Cardiac Enzymes: No results found for this basename: CKTOTAL, CKMB, CKMBINDEX, TROPONINI,  in the last 168 hours BNP (last 3 results) No results found for this basename: PROBNP,  in the last 8760 hours CBG:  Recent Labs Lab 08/17/13 1110 08/17/13 1632 08/17/13 2039 08/18/13 0010 08/18/13 0419  GLUCAP 274* 246* 361* 200* 116*    No results found for this or any previous visit (  from the past 240 hour(s)).   Studies: No results found.  Scheduled Meds: . ciprofloxacin  400 mg Intravenous Q12H  . enoxaparin (LOVENOX) injection  40 mg Subcutaneous Q24H  . insulin aspart  0-15 Units Subcutaneous Q4H  . insulin glargine  15 Units Subcutaneous Daily  . lisinopril  10 mg Oral Daily  . nystatin cream   Topical BID  . pneumococcal 23 valent vaccine  0.5 mL Intramuscular Tomorrow-1000  . potassium chloride  10 mEq Intravenous Q1 Hr x 3  . vancomycin  1,250 mg Intravenous Q12H   Continuous Infusions: . dextrose 5 % and 0.45% NaCl 100 mL/hr (08/18/13 0432)       Time spent: 30 minutes    [facility] Pager [phone]. If 7PM-7AM, please contact night-coverage at [email or web address], password [facility] 08/18/2013, 7:32 AM  LOS: 3 days

## 2013-08-18 NOTE — Anesthesia Preprocedure Evaluation (Addendum)
Anesthesia Evaluation  Patient identified by MRN, date of birth, ID band Patient awake    Reviewed: Allergy & Precautions, H&P , NPO status , Patient's Chart, lab work & pertinent test results  Airway Mallampati: II  Neck ROM: full    Dental  (+) Poor Dentition, Dental Advisory Given   Pulmonary asthma ,          Cardiovascular hypertension,     Neuro/Psych    GI/Hepatic   Endo/Other  diabetes, Type 2obese  Renal/GU      Musculoskeletal   Abdominal   Peds  Hematology   Anesthesia Other Findings   Reproductive/Obstetrics                          Anesthesia Physical Anesthesia Plan  ASA: II  Anesthesia Plan: General   Post-op Pain Management:    Induction: Intravenous  Airway Management Planned: Oral ETT  Additional Equipment:   Intra-op Plan:   Post-operative Plan: Extubation in OR  Informed Consent: I have reviewed the patients History and Physical, chart, labs and discussed the procedure including the risks, benefits and alternatives for the proposed anesthesia with the patient or authorized representative who has indicated his/her understanding and acceptance.   Dental advisory given  Plan Discussed with: CRNA, Anesthesiologist and Surgeon  Anesthesia Plan Comments:        Anesthesia Quick Evaluation

## 2013-08-18 NOTE — Interval H&P Note (Signed)
History and Physical Interval Note:  08/18/2013 7:22 AM  [facility]  has presented today for surgery, with the diagnosis of infected tibial nail  The various methods of treatment have been discussed with the patient and family. After consideration of risks, benefits and other options for treatment, the patient has consented to  Procedure(s): HARDWARE REMOVAL Left Tibial Nail (Left) as a surgical intervention .  The patient's history has been reviewed, patient examined, no change in status, stable for surgery.  I have reviewed the patient's chart and labs.  Questions were answered to the patient's satisfaction.     Sheral Apleyimothy D Bryce Cheever

## 2013-08-18 NOTE — Transfer of Care (Signed)
Immediate Anesthesia Transfer of Care Note  Patient: Louis Peterson  Procedure(s) Performed: Procedure(s): HARDWARE REMOVAL Left Tibial Nail (Left)  Patient Location: PACU  Anesthesia Type:General  Level of Consciousness: sedated  Airway & Oxygen Therapy: Patient Spontanous Breathing and Patient connected to nasal cannula oxygen  Post-op Assessment: Report given to PACU RN and Post -op Vital signs reviewed and stable  Post vital signs: Reviewed and stable  Complications: No apparent anesthesia complications

## 2013-08-18 NOTE — Plan of Care (Signed)
Problem: Food- and Nutrition-Related Knowledge Deficit (NB-1.1) Goal: Nutrition education Formal process to instruct or train a patient/client in a skill or to impart knowledge to help patients/clients voluntarily manage or modify food choices and eating behavior to maintain or improve health. Outcome: Completed/Met Date Met:  08/18/13  RD consulted for nutrition education regarding diabetes. Spoke with patient and his family via Spanish interpretor. Daughter also speaks [place]. Patient recently in [place] and typically ate one meal per day; he eats a lot of beans, rice, tortillas, fruits, and fruit juices.    Lab Results  Component Value Date    HGBA1C 12.4* 08/16/2013    RD provided "Carbohydrate Counting for People with Diabetes" handout from the Academy of Nutrition and Dietetics. Discussed different food groups and their effects on blood sugar, emphasizing carbohydrate-containing foods. Provided list of carbohydrates and recommended serving sizes of common foods. Also discussed foods with low carbohydrate content.  Discussed importance of controlled and consistent carbohydrate intake throughout the day. Provided examples of ways to balance meals/snacks and encouraged intake of high-fiber, whole grain complex carbohydrates. Teach back method used.  Expect good compliance.  Body mass index is 36.05 kg/(m^2). Pt meets criteria for obesity, class 2 based on current BMI.  Current diet order is NPO for procedure today. Labs and medications reviewed. No further nutrition interventions warranted at this time. RD contact information provided. If additional nutrition issues arise, please re-consult RD.  Molli Barrows, RD, LDN, [place] Pager [phone] After Hours Pager [phone]

## 2013-08-18 NOTE — Op Note (Signed)
08/15/2013 - 08/18/2013  2:08 PM  PATIENT:  Louis Peterson    PRE-OPERATIVE DIAGNOSIS:  infected tibial nail  POST-OPERATIVE DIAGNOSIS:  Same  PROCEDURE:  HARDWARE REMOVAL Left Tibial Nail  SURGEON:  Sheral Apleyimothy D Tysin Salada, MD  ASSISTANT: Janace LittenBrandon Parry OPA  ANESTHESIA:   Gen  PREOPERATIVE INDICATIONS:  Stasia CavalierSalvador Espitia Peterson is a  68 y.o. male with a diagnosis of infected tibial nail who failed conservative measures and elected for surgical management.    The risks benefits and alternatives were discussed with the patient preoperatively including but not limited to the risks of infection, bleeding, nerve injury, cardiopulmonary complications, the need for revision surgery, among others, and the patient was willing to proceed.  OPERATIVE IMPLANTS: none  OPERATIVE FINDINGS: loose hardware, puss tracking to bone and hardware  BLOOD LOSS: 100  COMPLICATIONS: none  TOURNIQUET TIME: none  OPERATIVE PROCEDURE:  Patient was identified in the preoperative holding area and site was marked by me He was transported to the operating theater and placed on the table in supine position taking care to pad all bony prominences. After a preincinduction time out anesthesia was induced. The left lower extremity was prepped and draped in normal sterile fashion and a pre-incision timeout was performed. He received ancef after culture for antibiotics.   Initially made an incision over the distal interlock screw where the purulent fluid immediately drained and was clearly under pressure and tracked down to the distal interlock screw there were no other additional pockets. I removed the first distal screw it had loosened I then made a second incision just distal instrument the second interlock screw that was actually outside of the nail but in the bone. I probed around and did not find any additional pus pockets.  I then turned my attention proximally where the same thing was found a draining wound it  tracked down to the proximal interlock screws which were removed and were found to be somewhat loose. Next a made an incision through the previous scar staying anterior and distal to the knee joint itself and tracking down to the patella tendon and I split again taking care to stay out of the joint. I then located the proximal aspect of the nail was able to remove it with little hammering. Feels that this is probably loosened secondary to. Fluid I then passed a 9.5 mm reamer down the shaft of the tibia.  Next I thoroughly irrigated the shaft of the tibia with 6 L of saline making sure that exited through each interlock hole once this was completed I irrigated the wounds themselves I then loosely reapply approximated her surgical incisions to allow some of the open wounds open to continue training and he'll from inside out as needed. A sterile dressing was applied he was taken the PACU in stable condition. Intraoperative cultures were sent prior to giving antibiotics.  POST OPERATIVE PLAN: ID consult, WBAT, chemical px per primary team, SCD's

## 2013-08-18 NOTE — Interval H&P Note (Signed)
History and Physical Interval Note:  08/18/2013 12:54 PM  [facility]  [facility] presented today for surgery, with the diagnosis of infected tibial nail  The various methods of treatment have been discussed with the patient and family. After consideration of risks, benefits and other options for treatment, the patient has consented to  Procedure(s): HARDWARE REMOVAL Left Tibial Nail (Left) as a surgical intervention .  The patient's history has been reviewed, patient examined, no change in status, stable for surgery.  I have reviewed the patient's chart and labs.  Questions were answered to the patient's satisfaction.     Sheral Apleyimothy D Phylliss Strege
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
## 2013-08-19 DIAGNOSIS — IMO0001 Reserved for inherently not codable concepts without codable children: Secondary | ICD-10-CM

## 2013-08-19 DIAGNOSIS — M869 Osteomyelitis, unspecified: Secondary | ICD-10-CM

## 2013-08-19 DIAGNOSIS — B957 Other staphylococcus as the cause of diseases classified elsewhere: Secondary | ICD-10-CM

## 2013-08-19 DIAGNOSIS — M79609 Pain in unspecified limb: Secondary | ICD-10-CM

## 2013-08-19 DIAGNOSIS — E1165 Type 2 diabetes mellitus with hyperglycemia: Secondary | ICD-10-CM

## 2013-08-19 LAB — CBC
HCT: 41 % (ref 39.0–52.0)
Hemoglobin: 14 g/dL (ref 13.0–17.0)
MCH: 31 pg (ref 26.0–34.0)
MCHC: 34.1 g/dL (ref 30.0–36.0)
MCV: 90.7 fL (ref 78.0–100.0)
PLATELETS: 158 10*3/uL (ref 150–400)
RBC: 4.52 MIL/uL (ref 4.22–5.81)
RDW: 13.5 % (ref 11.5–15.5)
WBC: 10 10*3/uL (ref 4.0–10.5)

## 2013-08-19 LAB — BASIC METABOLIC PANEL
BUN: 6 mg/dL (ref 6–23)
CALCIUM: 8.4 mg/dL (ref 8.4–10.5)
CO2: 27 mEq/L (ref 19–32)
Chloride: 103 mEq/L (ref 96–112)
Creatinine, Ser: 0.61 mg/dL (ref 0.50–1.35)
GFR calc Af Amer: >90 mL/min (ref >90)
GFR calc non Af Amer: >90 mL/min (ref >90)
GLUCOSE: 137 mg/dL — AB (ref 70–99)
Potassium: 3.7 mEq/L (ref 3.7–5.3)
Sodium: 141 mEq/L (ref 137–147)

## 2013-08-19 LAB — C-REACTIVE PROTEIN: CRP: 3 mg/dL — ABNORMAL HIGH (ref <0.60)

## 2013-08-19 LAB — HEPATITIS C ANTIBODY: HCV Ab: NEGATIVE

## 2013-08-19 LAB — GLUCOSE, CAPILLARY
GLUCOSE-CAPILLARY: 153 mg/dL — AB (ref 70–99)
GLUCOSE-CAPILLARY: 290 mg/dL — AB (ref 70–99)
GLUCOSE-CAPILLARY: 223 mg/dL — AB (ref 70–99)
Glucose-Capillary: 157 mg/dL — ABNORMAL HIGH (ref 70–99)
Glucose-Capillary: 234 mg/dL — ABNORMAL HIGH (ref 70–99)

## 2013-08-19 LAB — RPR

## 2013-08-19 LAB — HIV ANTIBODY (ROUTINE TESTING W REFLEX): HIV 1&2 Ab, 4th Generation: NONREACTIVE

## 2013-08-19 LAB — VANCOMYCIN, TROUGH: Vancomycin Tr: 8.6 ug/mL — ABNORMAL LOW (ref 10.0–20.0)

## 2013-08-19 MED ORDER — SODIUM CHLORIDE 0.9 % IJ SOLN
10.0000 mL | INTRAMUSCULAR | Status: DC | PRN
  Administered 2013-08-20 – 2013-08-21 (×3): 10 mL

## 2013-08-19 MED ORDER — INSULIN ASPART 100 UNIT/ML ~~LOC~~ SOLN
0.0000 [IU] | Freq: Three times a day (TID) | SUBCUTANEOUS | Status: DC
  Administered 2013-08-19 (×2): 5 [IU] via SUBCUTANEOUS
  Administered 2013-08-19 – 2013-08-20 (×2): 8 [IU] via SUBCUTANEOUS
  Administered 2013-08-20: 3 [IU] via SUBCUTANEOUS
  Administered 2013-08-20: 8 [IU] via SUBCUTANEOUS
  Administered 2013-08-20: 3 [IU] via SUBCUTANEOUS
  Administered 2013-08-21: 8 [IU] via SUBCUTANEOUS
  Administered 2013-08-21: 3 [IU] via SUBCUTANEOUS

## 2013-08-19 MED ORDER — VANCOMYCIN HCL 10 G IV SOLR
1250.0000 mg | Freq: Three times a day (TID) | INTRAVENOUS | Status: DC
  Administered 2013-08-20 – 2013-08-21 (×5): 1250 mg via INTRAVENOUS
  Filled 2013-08-19 (×6): qty 1250

## 2013-08-19 MED ORDER — CEFAZOLIN SODIUM-DEXTROSE 2-3 GM-% IV SOLR
2.0000 g | Freq: Three times a day (TID) | INTRAVENOUS | Status: DC
  Administered 2013-08-19 – 2013-08-21 (×7): 2 g via INTRAVENOUS
  Filled 2013-08-19 (×10): qty 50

## 2013-08-19 MED ORDER — POLYETHYLENE GLYCOL 3350 17 G PO PACK
17.0000 g | PACK | Freq: Two times a day (BID) | ORAL | Status: DC
  Administered 2013-08-19 – 2013-08-21 (×4): 17 g via ORAL
  Filled 2013-08-19 (×6): qty 1

## 2013-08-19 NOTE — Progress Notes (Signed)
I participated in the care of this patient and agree with the above history, physical and evaluation. I performed a review of the history and a physical exam as detailed   Yetunde Leis Daniel Jovon Winterhalter MD  

## 2013-08-19 NOTE — Progress Notes (Signed)
VASCULAR LAB PRELIMINARY  ARTERIAL  ABI completed:    RIGHT    LEFT    PRESSURE WAVEFORM  PRESSURE WAVEFORM  BRACHIAL 119 Triphasic BRACHIAL 116 Triphasic  DP Noncompressible Triphasic DP  Unable to insonate due to bandaging  AT   AT    PT Noncompressible  PT Unable to obtain due to bandaging Triphasic  PER   PER    GREAT TOE 147 NA GREAT TOE 137 NA    RIGHT LEFT  ABI Noncompressible Noncompressible    Bilateral lower extremity arteries are noncompressible, suggestive of calcified vessels; therefore unable to calculate ABI. Bilateral great toe pressures are suggestive of adequate perfusion for healing.  08/19/2013 11:32 AM Gertie FeyMichelle Stanton Kissoon, RVT, RDCS, RDMS

## 2013-08-19 NOTE — Progress Notes (Signed)
ANTIBIOTIC CONSULT NOTE - FOLLOW UP  Pharmacy Consult for Vancomycin Indication: Hardware infection  No Known Allergies  Patient Measurements: Height: 5' 5"  (165.1 cm) Weight: 216 lb 10.6 oz (98.277 kg) IBW/kg (Calculated) : 61.5 Adjusted Body Weight:   Vital Signs: Temp: 97.4 F (36.3 C) (04/17 1405) Temp src: Oral (04/17 0538) BP: 116/59 mmHg (04/17 1405) Pulse Rate: 77 (04/17 1405) Intake/Output from previous day: 04/16 0701 - 04/17 0700 In: 3286.7 [P.O.:50; I.V.:2536.7; IV Piggyback:700] Out: 300 [Urine:250; Blood:50] Intake/Output from this shift: Total I/O In: 240 [P.O.:240] Out: -   Labs:  Recent Labs  08/18/13 0306 08/19/13 0703  WBC 9.5 10.0  HGB 14.5 14.0  PLT 180 158  CREATININE 0.75 0.61   Estimated Creatinine Clearance: 95.3 ml/min (by C-G formula based on Cr of 0.61).  Recent Labs  08/19/13 1508  Stanley 8.6*     Microbiology: Recent Results (from the past 720 hour(s))  SURGICAL PCR SCREEN     Status: None   Collection Time    08/18/13  6:57 AM      Result Value Ref Range Status   MRSA, PCR NEGATIVE  NEGATIVE Final   Staphylococcus aureus NEGATIVE  NEGATIVE Final   Comment:            The Xpert SA Assay (FDA     approved for NASAL specimens     in patients over 59 years of age),     is one component of     a comprehensive surveillance     program.  Test performance has     been validated by [employer] for patients greater     than or equal to 28 year old.     It is not intended     to diagnose infection nor to     guide or monitor treatment.  TISSUE CULTURE     Status: None   Collection Time    08/18/13  1:44 PM      Result Value Ref Range Status   Specimen Description TISSUE LEFT LEG   Final   Special Requests DEEP TIBIAL CULTURE   Final   Gram Stain     Final   Value: ABUNDANT WBC PRESENT,BOTH PMN AND MONONUCLEAR     ABUNDANT GRAM POSITIVE COCCI IN CLUSTERS     Performed at [facility]   Culture     Final    Value: Culture reincubated for better growth     Performed at [facility]   Report Status PENDING   Incomplete  AFB CULTURE WITH SMEAR     Status: None   Collection Time    08/18/13  1:44 PM      Result Value Ref Range Status   Specimen Description TISSUE LEFT LEG   Final   Special Requests DEEP TIBIAL CULTURE   Final   ACID FAST SMEAR     Final   Value: NO ACID FAST BACILLI SEEN     Performed at [facility]   Culture     Final   Value: CULTURE WILL BE EXAMINED FOR 6 WEEKS BEFORE ISSUING A FINAL REPORT     Performed at [facility]   Report Status PENDING   Incomplete  ANAEROBIC CULTURE     Status: None   Collection Time    08/18/13  1:44 PM      Result Value Ref Range Status   Specimen Description TISSUE LEFT LEG   Final  Special Requests DEEP TIBIAL CULTURE   Final   Gram Stain     Final   Value: ABUNDANT WBC PRESENT,BOTH PMN AND MONONUCLEAR     ABUNDANT GRAM POSITIVE COCCI IN CLUSTERS     Performed at [facility]   Culture     Final   Value: NO ANAEROBES ISOLATED; CULTURE IN PROGRESS FOR 5 DAYS     Performed at [facility]   Report Status PENDING   Incomplete    Anti-infectives   Start     Dose/Rate Route Frequency Ordered Stop   08/19/13 1400  ceFAZolin (ANCEF) IVPB 2 g/50 mL premix     2 g 100 mL/hr over 30 Minutes Intravenous 3 times per day 08/19/13 1145     08/18/13 2100  cefTRIAXone (ROCEPHIN) 2 g in dextrose 5 % 50 mL IVPB  Status:  Discontinued     2 g 100 mL/hr over 30 Minutes Intravenous Every 24 hours 08/18/13 1843 08/19/13 1145   08/18/13 1800  ceFAZolin (ANCEF) IVPB 2 g/50 mL premix     2 g 100 mL/hr over 30 Minutes Intravenous Every 6 hours 08/18/13 1558 08/19/13 0705   08/18/13 1238  ceFAZolin (ANCEF) 2-3 GM-% IVPB SOLR    Comments:  Maude Leriche   : cabinet override      08/18/13 1238 08/19/13 0044   08/16/13 0600  ciprofloxacin (CIPRO) IVPB 400 mg  Status:  Discontinued     400 mg 200 mL/hr over 60  Minutes Intravenous Every 12 hours 08/16/13 0456 08/18/13 1843   08/16/13 0315  vancomycin (VANCOCIN) 1,250 mg in sodium chloride 0.9 % 250 mL IVPB     1,250 mg 166.7 mL/hr over 90 Minutes Intravenous Every 12 hours 08/16/13 0310     08/15/13 2300  fluconazole (DIFLUCAN) tablet 150 mg     150 mg Oral  Once 08/15/13 2258 08/15/13 2318   08/15/13 2245  vancomycin (VANCOCIN) IVPB 1000 mg/200 mL premix     1,000 mg 200 mL/hr over 60 Minutes Intravenous  Once 08/15/13 2241 08/15/13 2350      Assessment: CC: urinary incontinence, rash, itching  ID: Osteomyelitis tibia, 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 (Hardware removed 4/16) 1st Vanc at [facility]. Afebrile, WBC 10. ESR 30; Plan MRI to r/o osteo Abx's x 6 weeks per ID. Vancomycin trough 8.6  CTX 4/16 >>  Vanc 4/13>> Cipro 4/14>>4/16 Flucon PO x 1 4/13  No cultures   Goal of Therapy:  Vancomycin trough level 15-20 mcg/ml until r/o osteo  Plan:  F/u MRI - Increase Vanc to 1285m q8h   Chayce Robbins S. RAlford Highland PharmD, BGrant Memorial HospitalClinical Staff Pharmacist Pager [phone] CCoralyn PearRAlford Highland4/17/2015,4:09 PM

## 2013-08-19 NOTE — Progress Notes (Signed)
Inpatient Diabetes Program Recommendations  AACE/ADA: New Consensus Statement on Inpatient Glycemic Control (2013)  Target Ranges:  Prepandial:   less than 140 mg/dL      Peak postprandial:   less than 180 mg/dL (1-2 hours)      Critically ill patients:  140 - 180 mg/dL  Results for Louis Peterson, Louis Peterson (MRN [id number]) as of 08/19/2013 12:32  Ref. Range 08/19/2013 04:18 08/19/2013 08:07 08/19/2013 11:29  Glucose-Capillary Latest Range: 70-99 mg/dL 865153 (H) 784157 (H) 696290 (H)   Recommend adding Novolog 4 units TID for meal coverage per Glycemic Control Order-set. Thank you  Piedad ClimesGina Jaden Abreu BSN, RN,CDE Inpatient Diabetes Coordinator [phone] (team pager)

## 2013-08-19 NOTE — Care Management Note (Signed)
08/19/13 10:45am Vance PeperSusan Tisheena Maguire, RN BSN Case Manager [phone] CM spooke with patient and daughter concerning need for home health RN for PICC line care and IV antibiotics. Choice offered to daughter.[facility]ick[facility],RN BSN , [facility]. Patient will be staying with his daughter- Louis LatheMaria Peterson  [address] [place] # [phone].

## 2013-08-19 NOTE — Care Management Note (Signed)
08/19/13 11:17am Vance PeperSusan Teofil Maniaci, RN BSN Case Manager [phone] CM [facility] to get established with PCP. Friday, April 24,2015 at 5:00pm with Holland CommonsValerie Keck, NP.

## 2013-08-19 NOTE — Evaluation (Signed)
Physical Therapy Evaluation Patient Details Name: Louis CavalierSalvador Espitia Toledo MRN: [id number] DOB: 10/02/1945 Today's Date: 08/19/2013   History of Present Illness  Patient is a 68 yo male, spanish speaking, s/p hardware removal from Lt tibia due to infection/cellulitis (tibial fx with nailing in 2010).   Clinical Impression  Patient presents with problems listed below.  Will benefit from acute PT to maximize independence prior to discharge home with family.  Will need RW for safe mobility.    Follow Up Recommendations No PT follow up;Supervision/Assistance - 24 hour    Equipment Recommendations  Rolling walker with 5" wheels    Recommendations for Other Services       Precautions / Restrictions Precautions Precautions: Fall Restrictions Weight Bearing Restrictions: Yes LLE Weight Bearing: Weight bearing as tolerated      Mobility  Bed Mobility Overal bed mobility: Needs Assistance Bed Mobility: Supine to Sit     Supine to sit: Min guard     General bed mobility comments: Visual cues for technique.  Patient requires only min guard for safety.  Transfers Overall transfer level: Needs assistance Equipment used: Rolling walker (2 wheeled) Transfers: Sit to/from Stand Sit to Stand: Min guard         General transfer comment: Verbal cues for hand placement.  Min guard for safety/balance.  Ambulation/Gait Ambulation/Gait assistance: Min guard Ambulation Distance (Feet): 110 Feet Assistive device: Rolling walker (2 wheeled) Gait Pattern/deviations: Step-to pattern;Decreased stance time - left;Decreased step length - right;Decreased weight shift to left;Antalgic;Trunk flexed Gait velocity: Decreased Gait velocity interpretation: Below normal speed for age/gender General Gait Details: Visual cues for use of RW and gait sequence.  Patient using a TDWB gait on LLE - due to pain with weightbearing.  Stairs            Wheelchair Mobility    Modified Rankin (Stroke  Patients Only)       Balance                                             Pertinent Vitals/Pain     Home Living Family/patient expects to be discharged to:: Private residence Living Arrangements: Children (Daughter) Available Help at Discharge: Family;Available 24 hours/day Type of Home: House Home Access: Stairs to enter Entrance Stairs-Rails: None Entrance Stairs-Number of Steps: 2-3 small steps Home Layout: One level Home Equipment: Crutches      Prior Function Level of Independence: Independent               Hand Dominance   Dominant Hand: Right    Extremity/Trunk Assessment   Upper Extremity Assessment: Overall WFL for tasks assessed           Lower Extremity Assessment: LLE deficits/detail   LLE Deficits / Details: Difficult to assess due to bandaging, pain.  Drainage noted on pillow under foot. RN aware.  Patient able to lift LLE against gravity.  Did not test ankle.  Cervical / Trunk Assessment: Normal  Communication   Communication: Prefers language other than English (Spanish)  Cognition Arousal/Alertness: Awake/alert Behavior During Therapy: WFL for tasks assessed/performed Overall Cognitive Status: Difficult to assess                      General Comments General comments (skin integrity, edema, etc.): Drainage from LLE bandages on pillow - RN aware.    Exercises  Assessment/Plan    PT Assessment Patient needs continued PT services  PT Diagnosis Difficulty walking;Acute pain   PT Problem List Decreased strength;Decreased activity tolerance;Decreased balance;Decreased mobility;Decreased knowledge of use of DME;Pain  PT Treatment Interventions DME instruction;Gait training;Stair training;Functional mobility training;Patient/family education   PT Goals (Current goals can be found in the Care Plan section) Acute Rehab PT Goals Patient Stated Goal: None stated PT Goal Formulation: With  patient/family Time For Goal Achievement: 08/26/13 Potential to Achieve Goals: Good    Frequency Min 5X/week   Barriers to discharge        Co-evaluation               End of Session Equipment Utilized During Treatment: Gait belt Activity Tolerance: Patient tolerated treatment well;Patient limited by pain Patient left: in chair;with call bell/phone within reach Nurse Communication: Mobility status;Patient requests pain meds         Time: 1415-1434 PT Time Calculation (min): 19 min   Charges:   PT Evaluation $Initial PT Evaluation Tier I: 1 Procedure PT Treatments $Gait Training: 8-22 mins   PT G Codes:          Vena AustriaSusan H Hart Haas 08/19/2013, 5:31 PM Durenda HurtSusan H. Renaldo Fiddleravis, PT, [facility] Acute Rehab Services Pager [phone]

## 2013-08-19 NOTE — Progress Notes (Signed)
Peripherally Inserted Central Catheter/Midline Placement  The IV Nurse has discussed with the patient and/or persons authorized to consent for the patient, the purpose of this procedure and the potential benefits and risks involved with this procedure.  The benefits include less needle sticks, lab draws from the catheter and patient may be discharged home with the catheter.  Risks include, but not limited to, infection, bleeding, blood clot (thrombus formation), and puncture of an artery; nerve damage and irregular heat beat.  Alternatives to this procedure were also discussed.  Interpretation provided via telephone with [facility] interpre[facility] [phone].  PICC/Midline Placement Documentation        Louis Peterson 08/19/2013, 7:33 PM

## 2013-08-19 NOTE — Progress Notes (Signed)
Patient ID: Louis Peterson, male   DOB: 01/14/1946, 68 y.o.   MRN: [id number]     Subjective:  Patient reports pain as mild to moderate.  Patient does not speak much english but is able to follow commands.  Objective:   VITALS:   Filed Vitals:   08/18/13 1555 08/18/13 2017 08/18/13 2355 08/19/13 0538  BP: 147/86 127/78 114/75 128/65  Pulse: 99 97 98 72  Temp: 97.6 F (36.4 C) 98.1 F (36.7 C) 98.1 F (36.7 C) 98 F (36.7 C)  TempSrc: Oral Oral Oral Oral  Resp: 16 16 16 16   Height:      Weight:      SpO2: 97% 100% 99% 98%    ABD soft Sensation intact distally Dorsiflexion/Plantar flexion intact Incision: dressing C/D/I and no drainage   Lab Results  Component Value Date   WBC 10.0 08/19/2013   HGB 14.0 08/19/2013   HCT 41.0 08/19/2013   MCV 90.7 08/19/2013   PLT 158 08/19/2013     Assessment/Plan: 1 Day Post-Op   Principal Problem:   Cellulitis Active Problems:   Hyperglycemia   Elevated blood pressure   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

## 2013-08-19 NOTE — Progress Notes (Signed)
ANTIBIOTIC CONSULT NOTE - FOLLOW UP  Pharmacy Consult for Vancomycin Indication: Hardware infection  No Known Allergies  Patient Measurements: Height: 5' 5"  (165.1 cm) Weight: 216 lb 10.6 oz (98.277 kg) IBW/kg (Calculated) : 61.5 Adjusted Body Weight:   Vital Signs: Temp: 98 F (36.7 C) (04/17 0538) Temp src: Oral (04/17 0538) BP: 128/65 mmHg (04/17 0538) Pulse Rate: 72 (04/17 0538) Intake/Output from previous day: 04/16 0701 - 04/17 0700 In: 3286.7 [P.O.:50; I.V.:2536.7; IV Piggyback:700] Out: 300 [Urine:250; Blood:50] Intake/Output from this shift: Total I/O In: 240 [P.O.:240] Out: -   Labs:  Recent Labs  08/18/13 0306 08/19/13 0703  WBC 9.5 10.0  HGB 14.5 14.0  PLT 180 158  CREATININE 0.75 0.61   Estimated Creatinine Clearance: 95.3 ml/min (by C-G formula based on Cr of 0.61). No results found for this basename: VANCOTROUGH, Corlis Leak, VANCORANDOM, GENTTROUGH, GENTPEAK, GENTRANDOM, TOBRATROUGH, TOBRAPEAK, TOBRARND, AMIKACINPEAK, AMIKACINTROU, AMIKACIN,  in the last 72 hours   Microbiology: Recent Results (from the past 720 hour(s))  SURGICAL PCR SCREEN     Status: None   Collection Time    08/18/13  6:57 AM      Result Value Ref Range Status   MRSA, PCR NEGATIVE  NEGATIVE Final   Staphylococcus aureus NEGATIVE  NEGATIVE Final   Comment:            The Xpert SA Assay (FDA     approved for NASAL specimens     in patients over 24 years of age),     is one component of     a comprehensive surveillance     program.  Test performance has     been validated by [employer] for patients greater     than or equal to 9 year old.     It is not intended     to diagnose infection nor to     guide or monitor treatment.  TISSUE CULTURE     Status: None   Collection Time    08/18/13  1:44 PM      Result Value Ref Range Status   Specimen Description TISSUE LEFT LEG   Final   Special Requests DEEP TIBIAL CULTURE   Final   Gram Stain     Final   Value:  ABUNDANT WBC PRESENT,BOTH PMN AND MONONUCLEAR     ABUNDANT GRAM POSITIVE COCCI IN CLUSTERS     Performed at [facility]   Culture     Final   Value: Culture reincubated for better growth     Performed at [facility]   Report Status PENDING   Incomplete  ANAEROBIC CULTURE     Status: None   Collection Time    08/18/13  1:44 PM      Result Value Ref Range Status   Specimen Description TISSUE LEFT LEG   Final   Special Requests DEEP TIBIAL CULTURE   Final   Gram Stain     Final   Value: ABUNDANT WBC PRESENT,BOTH PMN AND MONONUCLEAR     ABUNDANT GRAM POSITIVE COCCI IN CLUSTERS     Performed at [facility]   Culture PENDING   Incomplete   Report Status PENDING   Incomplete    Anti-infectives   Start     Dose/Rate Route Frequency Ordered Stop   08/18/13 2100  cefTRIAXone (ROCEPHIN) 2 g in dextrose 5 % 50 mL IVPB     2 g 100 mL/hr over 30 Minutes Intravenous  Every 24 hours 08/18/13 1843     08/18/13 1800  ceFAZolin (ANCEF) IVPB 2 g/50 mL premix     2 g 100 mL/hr over 30 Minutes Intravenous Every 6 hours 08/18/13 1558 08/19/13 0705   08/18/13 1238  ceFAZolin (ANCEF) 2-3 GM-% IVPB SOLR    Comments:  Maude Leriche   : cabinet override      08/18/13 1238 08/19/13 0044   08/16/13 0600  ciprofloxacin (CIPRO) IVPB 400 mg  Status:  Discontinued     400 mg 200 mL/hr over 60 Minutes Intravenous Every 12 hours 08/16/13 0456 08/18/13 1843   08/16/13 0315  vancomycin (VANCOCIN) 1,250 mg in sodium chloride 0.9 % 250 mL IVPB     1,250 mg 166.7 mL/hr over 90 Minutes Intravenous Every 12 hours 08/16/13 0310     08/15/13 2300  fluconazole (DIFLUCAN) tablet 150 mg     150 mg Oral  Once 08/15/13 2258 08/15/13 2318   08/15/13 2245  vancomycin (VANCOCIN) IVPB 1000 mg/200 mL premix     1,000 mg 200 mL/hr over 60 Minutes Intravenous  Once 08/15/13 2241 08/15/13 2350      Assessment: CC: urinary incontinence, rash, itching  AC: Lovenox40  ID: Osteomyelitis tibia,  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 (Hardware removed 4/16) 1st Vanc at [facility]. Afebrile, WBC 10. ESR 30; Plan MRI to r/o osteo Abx's x 6 weeks per ID  CTX 4/16 >>  Vanc 4/13>> Cipro 4/14>>4/16 Flucon PO x 1 4/13  No cultures  CV: VSS  Endo: new DM dx. A1C 12.4. CBGs still up, 153-302 but improving. On SSI, lantus incr to 15 daily for 4/15; DM RN seeing.  GI/Nutr: LFTs ok, albumin 3.0. Miralax for constipation. Neuro: no issues Renal: Scr 0.61, K+ 3.7, Crrcl ~95. NS at 100 ml/hr Pulm: 98% on 2L Heme/Onc: Hgb 14, PLTC 158 down. PTA med issues: unknown med daily prn for foot pain. Unable to ascertain name d/t language barrier, despite help from interpreter. Only speaks Spanish Best practices: enox 40   Goal of Therapy:  Vancomycin trough level 15-20 mcg/ml until r/o osteo  Plan:  F/u MRI - Vanc 1250 q12h - Trough this afternoon.   Irlanda Croghan S. Alford Highland, PharmD, [facility] Clinical Staff Pharmacist Pager [phone]  [facility] Alford Highland 08/19/2013,11:12 AM

## 2013-08-19 NOTE — Progress Notes (Signed)
[facility]    Subjective: No new complaints   Antibiotics:  Anti-infectives   Start     Dose/Rate Route Frequency Ordered Stop   08/19/13 1400  ceFAZolin (ANCEF) IVPB 2 g/50 mL premix     2 g 100 mL/hr over 30 Minutes Intravenous 3 times per day 08/19/13 1145     08/18/13 2100  cefTRIAXone (ROCEPHIN) 2 g in dextrose 5 % 50 mL IVPB  Status:  Discontinued     2 g 100 mL/hr over 30 Minutes Intravenous Every 24 hours 08/18/13 1843 08/19/13 1145   08/18/13 1800  ceFAZolin (ANCEF) IVPB 2 g/50 mL premix     2 g 100 mL/hr over 30 Minutes Intravenous Every 6 hours 08/18/13 1558 08/19/13 0705   08/18/13 1238  ceFAZolin (ANCEF) 2-3 GM-% IVPB SOLR    Comments:  Brien MatesMahony, Rebecca   : cabinet override      08/18/13 1238 08/19/13 0044   08/16/13 0600  ciprofloxacin (CIPRO) IVPB 400 mg  Status:  Discontinued     400 mg 200 mL/hr over 60 Minutes Intravenous Every 12 hours 08/16/13 0456 08/18/13 1843   08/16/13 0315  vancomycin (VANCOCIN) 1,250 mg in sodium chloride 0.9 % 250 mL IVPB     1,250 mg 166.7 mL/hr over 90 Minutes Intravenous Every 12 hours 08/16/13 0310     08/15/13 2300  fluconazole (DIFLUCAN) tablet 150 mg     150 mg Oral  Once 08/15/13 2258 08/15/13 2318   08/15/13 2245  vancomycin (VANCOCIN) IVPB 1000 mg/200 mL premix     1,000 mg 200 mL/hr over 60 Minutes Intravenous  Once 08/15/13 2241 08/15/13 2350      Medications: Scheduled Meds: .  ceFAZolin (ANCEF) IV  2 g Intravenous 3 times per day  . enoxaparin (LOVENOX) injection  40 mg Subcutaneous Q24H  . insulin aspart  0-15 Units Subcutaneous TID WC & HS  . insulin glargine  15 Units Subcutaneous Daily  . nystatin cream   Topical BID  . pneumococcal 23 valent vaccine  0.5 mL Intramuscular Tomorrow-1000  . polyethylene glycol  17 g Oral BID  . vancomycin  1,250 mg Intravenous Q12H   Continuous Infusions:  PRN Meds:.acetaminophen, acetaminophen, alum & mag hydroxide-simeth,  HYDROcodone-acetaminophen, ondansetron (ZOFRAN) IV, ondansetron, polyvinyl alcohol    Objective: Weight change:   Intake/Output Summary (Last 24 hours) at 08/19/13 1338 Last data filed at 08/19/13 0830  Gross per 24 hour  Intake 3026.67 ml  Output    300 ml  Net 2726.67 ml   Blood pressure 128/65, pulse 72, temperature 98 F (36.7 C), temperature source Oral, resp. rate 16, height 5\' 5"  (1.651 m), weight 216 lb 10.6 oz (98.277 kg), SpO2 98.00%. Temp:  [97.6 F (36.4 C)-98.5 F (36.9 C)] 98 F (36.7 C) (04/17 0538) Pulse Rate:  [72-102] 72 (04/17 0538) Resp:  [14-29] 16 (04/17 0538) BP: (114-151)/(65-92) 128/65 mmHg (04/17 0538) SpO2:  [94 %-100 %] 98 % (04/17 0538)  Physical Exam: General: Alert and awake, oriented x3, not in any acute distress. HEENT: anicteric sclera, , EOMI CVS regular rate, normal r,  no murmur rubs or gallops Chest: clear to auscultation bilaterally, no wheezing, rales or rhonchi Abdomen: soft nontender, nondistended, normal bowel sounds, Extremities: lower leg wrapped Neuro: nonfocal  CBC:  Recent Labs Lab 08/15/13 1953 08/16/13 0110 08/18/13 0306 08/19/13 0703  HGB 16.9 14.8 14.5 14.0  HCT 48.5 41.8 42.5 41.0  PLT 207 199 180 158  BMET  Recent Labs  08/18/13 0306 08/19/13 0703  NA 142 141  K 3.3* 3.7  CL 104 103  CO2 28 27  GLUCOSE 116* 137*  BUN 11 6  CREATININE 0.75 0.61  CALCIUM 8.7 8.4     Liver Panel  No results found for this basename: PROT, ALBUMIN, AST, ALT, ALKPHOS, BILITOT, BILIDIR, IBILI,  in the last 72 hours     Sedimentation Rate  Recent Labs  08/18/13 1905  ESRSEDRATE 1   C-Reactive Protein  Recent Labs  08/18/13 1905  CRP 3.0*    Micro Results: Recent Results (from the past 240 hour(s))  SURGICAL PCR SCREEN     Status: None   Collection Time    08/18/13  6:57 AM      Result Value Ref Range Status   MRSA, PCR NEGATIVE  NEGATIVE Final   Staphylococcus aureus NEGATIVE  NEGATIVE Final     Comment:            The Xpert SA Assay (FDA     approved for NASAL specimens     in patients over 62 years of age),     is one component of     a comprehensive surveillance     program.  Test performance has     been validated by [employer] for patients greater     than or equal to 47 year old.     It is not intended     to diagnose infection nor to     guide or monitor treatment.  TISSUE CULTURE     Status: None   Collection Time    08/18/13  1:44 PM      Result Value Ref Range Status   Specimen Description TISSUE LEFT LEG   Final   Special Requests DEEP TIBIAL CULTURE   Final   Gram Stain     Final   Value: ABUNDANT WBC PRESENT,BOTH PMN AND MONONUCLEAR     ABUNDANT GRAM POSITIVE COCCI IN CLUSTERS     Performed at [facility]   Culture     Final   Value: Culture reincubated for better growth     Performed at [facility]   Report Status PENDING   Incomplete  ANAEROBIC CULTURE     Status: None   Collection Time    08/18/13  1:44 PM      Result Value Ref Range Status   Specimen Description TISSUE LEFT LEG   Final   Special Requests DEEP TIBIAL CULTURE   Final   Gram Stain     Final   Value: ABUNDANT WBC PRESENT,BOTH PMN AND MONONUCLEAR     ABUNDANT GRAM POSITIVE COCCI IN CLUSTERS     Performed at [facility]   Culture     Final   Value: NO ANAEROBES ISOLATED; CULTURE IN PROGRESS FOR 5 DAYS     Performed at [facility]   Report Status PENDING   Incomplete    Studies/Results: Dg Tibia/fibula Left  08/18/2013   CLINICAL DATA:  Hardware removal  EXAM: LEFT TIBIA AND FIBULA - 2 VIEW  COMPARISON:  Portable exam 1523 hr compared to the intraoperative images of 08/18/2013  FINDINGS: Pain tracks from prior hardware noted in tibia.  Healed oblique fracture of the middle third left tibia.  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  Osseous demineralization.  No additional focal bony  abnormality  seen.  IMPRESSION: No acute abnormalities as above.  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   Electronically Signed   By: Ulyses SouthwardMark  Boles M.D.   On: 08/18/2013 16:18   Dg Tibia/fibula Left  08/18/2013   CLINICAL DATA:  Hardware removed  EXAM: LEFT TIBIA AND FIBULA - 2 VIEW  COMPARISON:  DG TIBIA/FIBULA*L* dated 08/15/2013  FINDINGS: The intra medullary rod has been removed from the tibia. The appearance of the tibia and distal fibular fracture is stable.  IMPRESSION: Removal of the intra medullary rod from the tibia.   Electronically Signed   By: Maryclare BeanArt  Hoss M.D.   On: 08/18/2013 16:05   Dg C-arm 1-60 Min  08/18/2013   CLINICAL DATA:  Hardware removal  EXAM: DG C-ARM 1-60 MIN  COMPARISON:  DG TIBIA/FIBULA*L* dated 08/15/2013  FINDINGS: The intra medullary rod has been removed within the tibia. The appearance of the tibia and fibula is stable.  IMPRESSION: Removal of the intra medullary rod from the tibia.   Electronically Signed   By: Maryclare BeanArt  Hoss M.D.   On: 08/18/2013 16:04      Assessment/Plan:  Principal Problem:   Cellulitis Active Problems:   Hyperglycemia   Elevated blood pressure   Type II or unspecified type diabetes mellitus without mention of complication, uncontrolled    Jason Espitia Norma Fredricksonoledo is a 68 y.o. 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  #1 hardware associated osteomyelitis:  Seems to be do to methicillin resistant versus methicillin sensitive Staphylococcus aureus.  Will place him on vancomycin and cefazolin and narrowed to the appropriate one of these 2 as cultures come back.  Patient on contact her cautions in place and we know whether not this is MRSA  He should receive 8 weeks of postoperative IV antibiotics  #2 screening check HIV and hepatitis serologies  Dr. Orvan Falconerampbell available on the  weekend for questions.    LOS: 4 days   Randall HissCornelius N Van Dam 08/19/2013, 1:38 PM

## 2013-08-19 NOTE — Progress Notes (Signed)
[facility] PROGRESS NOTE  Christerpher Clos [facility] QTM:[id number] DOB: 07-26-1945 DOA: 08/15/2013 PCP: No primary provider on file. History lightheaded by daughter-in-law [place] at bedside  Assessment/Plan: Osteomyelitis tibia, hardware infection tibial nail.  Patient S/P Hardware removal tibial nail 4-16. Culture pending.  Needs 6 weeks IV antibiotics. Picc Line ordered.  Continue empiric vancomycin and ceftriaxone, day 4 antibiotics.  ESR  30---1.  Monitor WBC and temperature Appreciate Dr Percell Miller and Dr Baxter Flattery assistance.  Will order arterial doppler.   New onset diabetes mellitus with severe hyperglycemia A1c at 12. . Needs diabetic teaching and lifestyle modifications. Diabetic coordinator consult. Continue with SSI. Resume lantus.   Hypertension Holding lisinopril SBP 130.   Hypokalemia; B-met pending.   Constipation; start miralax.   DVT prophylaxis; Lovenox.   Obesity Counseled on weight loss and exercise    Code Status: Full code Family Communication: care discussed with patient.  Disposition Plan: to be determine.    Consultants:  Dr Percell Miller  Dr Baxter Flattery.   Procedures:  none  Antibiotics:  IV vancomycin 4-14  ciprofloxacin 4/14>>4-16  Ceftriaxone 4-16.   HPI/Subjective: Feeling ok, no BM since Monday. Eating breakfast. No significant left LE pain.   Objective: Filed Vitals:   08/19/13 0538  BP: 128/65  Pulse: 72  Temp: 98 F (36.7 C)  Resp: 16    Intake/Output Summary (Last 24 hours) at 08/19/13 0800 Last data filed at 08/19/13 0640  Gross per 24 hour  Intake 3286.67 ml  Output    300 ml  Net 2986.67 ml   Filed Weights   08/16/13 0034 08/17/13 0447 08/18/13 0400  Weight: 97.251 kg (214 lb 6.4 oz) 98.385 kg (216 lb 14.4 oz) 98.277 kg (216 lb 10.6 oz)    Exam:   General:  Elderly obese male lying in bed in no acute distress  HEENT: No pallor, moist oral mucosa  Chest: Clear to auscultation bilaterally, no added  sounds  CVS: Normal S1-S2, no murmurs rub or gallop  Abdomen: Soft, nontender, nondistended, bowel sounds present  Extremities: left Lower extremity with clean dressing.   CNS: AAO x3    Data Reviewed: Basic Metabolic Panel:  Recent Labs Lab 08/15/13 1953 08/16/13 0110 08/18/13 0306  NA 137 139 142  K 4.6 5.0 3.3*  CL 97 103 104  CO2 25 24 28   GLUCOSE 690* 376* 116*  BUN 20 16 11   CREATININE 0.80 0.68 0.75  CALCIUM 9.2 8.5 8.7   Liver Function Tests:  Recent Labs Lab 08/15/13 1953  AST 22  ALT 33  ALKPHOS 178*  BILITOT 0.3  PROT 7.4  ALBUMIN 3.0*   No results found for this basename: LIPASE, AMYLASE,  in the last 168 hours No results found for this basename: AMMONIA,  in the last 168 hours CBC:  Recent Labs Lab 08/15/13 1953 08/16/13 0110 08/18/13 0306 08/19/13 0703  WBC 10.1 11.4* 9.5 10.0  NEUTROABS 7.3 7.7  --   --   HGB 16.9 14.8 14.5 14.0  HCT 48.5 41.8 42.5 41.0  MCV 90.1 88.6 89.9 90.7  PLT 207 199 180 158   Cardiac Enzymes: No results found for this basename: CKTOTAL, CKMB, CKMBINDEX, TROPONINI,  in the last 168 hours BNP (last 3 results) No results found for this basename: PROBNP,  in the last 8760 hours CBG:  Recent Labs Lab 08/18/13 1422 08/18/13 1602 08/18/13 2028 08/18/13 2346 08/19/13 0418  GLUCAP 166* 204* 242* 195* 153*    Recent Results (from the past 240 hour(s))  SURGICAL PCR SCREEN     Status: None   Collection Time    08/18/13  6:57 AM      Result Value Ref Range Status   MRSA, PCR NEGATIVE  NEGATIVE Final   Staphylococcus aureus NEGATIVE  NEGATIVE Final   Comment:            The Xpert SA Assay (FDA     approved for NASAL specimens     in patients over 34 years of age),     is one component of     a comprehensive surveillance     program.  Test performance has     been validated by [employer] for patients greater     than or equal to 77 year old.     It is not intended     to diagnose infection nor  to     guide or monitor treatment.  TISSUE CULTURE     Status: None   Collection Time    08/18/13  1:44 PM      Result Value Ref Range Status   Specimen Description TISSUE LEFT LEG   Final   Special Requests DEEP TIBIAL CULTURE   Final   Gram Stain PENDING   Incomplete   Culture     Final   Value: Culture reincubated for better growth     Performed at [facility]   Report Status PENDING   Incomplete     Studies: Dg Tibia/fibula Left  08/18/2013   CLINICAL DATA:  Hardware removal  EXAM: LEFT TIBIA AND FIBULA - 2 VIEW  COMPARISON:  Portable exam 1523 hr compared to the intraoperative images of 08/18/2013  FINDINGS: Pain tracks from prior hardware noted in tibia.  Healed oblique fracture of the middle third left tibia.  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  Osseous demineralization.  No additional focal bony abnormality seen.  IMPRESSION: No acute abnormalities as above.  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   Electronically Signed   By: Lavonia Dana M.D.   On: 08/18/2013 16:18   Dg Tibia/fibula Left  08/18/2013   CLINICAL DATA:  Hardware removed  EXAM: LEFT TIBIA AND FIBULA - 2 VIEW  COMPARISON:  DG TIBIA/FIBULA*L* dated 08/15/2013  FINDINGS: The intra medullary rod has been removed from the tibia. The appearance of the tibia and distal fibular fracture is stable.  IMPRESSION: Removal of the intra medullary rod from the tibia.   Electronically Signed   By: Maryclare Bean M.D.   On: 08/18/2013 16:05   Dg C-arm 1-60 Min  08/18/2013   CLINICAL DATA:  Hardware removal  EXAM: DG C-ARM 1-60 MIN  COMPARISON:  DG TIBIA/FIBULA*L* dated 08/15/2013  FINDINGS: The intra medullary rod has been removed within the tibia. The appearance of the tibia and fibula is stable.  IMPRESSION: Removal of the intra medullary rod from the tibia.   Electronically Signed   By: Maryclare Bean M.D.   On: 08/18/2013 16:04     Scheduled Meds: . cefTRIAXone (ROCEPHIN)  IV  2 g Intravenous Q24H  . enoxaparin (LOVENOX) injection  40 mg Subcutaneous Q24H  . insulin aspart  0-15 Units Subcutaneous Q4H  . insulin glargine  15 Units Subcutaneous Daily  . nystatin cream   Topical BID  . pneumococcal 23 valent vaccine  0.5 mL Intramuscular Tomorrow-1000  . polyethylene glycol  17 g Oral BID  . vancomycin  1,250 mg Intravenous Q12H   Continuous Infusions:      Time spent: 30 minutes    [facility] Pager [phone]. If 7PM-7AM, please contact night-coverage at [email or web address], password [facility] 08/19/2013, 8:00 AM  LOS: 4 days

## 2013-08-19 NOTE — Progress Notes (Signed)
Occupational Therapy Evaluation and Discharge Patient Details Name: Louis CavalierSalvador Espitia Peterson MRN: [id number] DOB: 03/21/1946 Today's Date: 08/19/2013    History of Present Illness Pt is 68yo Male, spanish speaking, s/p HARDWARE REMOVAL Left Tibial Nail.   Clinical Impression   PTA pt lived with his daughter and her children and was independent with ADLs and functional mobility. Communicated with pt through [employer]. Later discussed education with daughter per pt's request. Education and training completed for compensatory techniques for LB ADLs and fall prevention strategies. Pt declined OOB activities this date, however is moving well per PT. No further acute OT needs. Acute OT to sign off.    Follow Up Recommendations  Supervision/Assistance - 24 hour    Equipment Recommendations  3 in 1 bedside comode       Precautions / Restrictions Restrictions Weight Bearing Restrictions: Yes LLE Weight Bearing: Weight bearing as tolerated      Mobility Bed Mobility               General bed mobility comments: not assessed  Transfers                 General transfer comment: not assessed    Balance                                            ADL Overall ADL's : Needs assistance/impaired Eating/Feeding: Independent;Sitting   Grooming: Set up;Sitting   Upper Body Bathing: Set up;Sitting   Lower Body Bathing: Minimal assistance;Sit to/from stand   Upper Body Dressing : Set up;Sitting   Lower Body Dressing: Moderate assistance;Sit to/from stand                 General ADL Comments: Pt declined OOB activities and sat in recliner for duration.               Pertinent Vitals/Pain Pt c/o pain in L leg and no numerical score provided.     Hand Dominance Right   Extremity/Trunk Assessment Upper Extremity Assessment Upper Extremity Assessment: Overall WFL for tasks assessed   Lower Extremity Assessment Lower Extremity  Assessment: Defer to PT evaluation       Communication Communication Communication: Prefers language other than [place];Interpreter utilized (Spanish)   Cognition Arousal/Alertness: Awake/alert Behavior During Therapy: WFL for tasks assessed/performed Overall Cognitive Status: Within Functional Limits for tasks assessed                                Home Living Family/patient expects to be discharged to:: Private residence Living Arrangements: Children Available Help at Discharge: Family;Available 24 hours/day Type of Home: House Home Access: Stairs to enter [employer] Corporation[employer]trance Stairs-Number of Steps: 2-3 small steps Entrance Stairs-Rails: None  Home Layout: One level     Bathroom Shower/Tub: Other (comment) (Pt reports this is a newly rented house and unsure of tub/sh)   FirefighterBathroom Toilet: Standard     Home Equipment: Crutches          Prior Functioning/Environment Level of Independence: Independent              End of Session Nurse Communication: Other (comment) (daughter will return after she picks up children from school) Activity Tolerance: Patient tolerated treatment well Patient left: in chair;with call bell/phone within reach   Time: 1436-1457 OT Time Calculation (min): 21  min Charges:  OT General Charges $OT Visit: 1 Procedure OT Evaluation $Initial OT Evaluation Tier I: 1 Procedure OT Treatments $Self Care/Home Management : 8-22 mins  Rae LipsLeeann M Yechezkel Fertig [phone] 08/19/2013, 3:42 PM

## 2013-08-20 DIAGNOSIS — S91009A Unspecified open wound, unspecified ankle, initial encounter: Secondary | ICD-10-CM

## 2013-08-20 DIAGNOSIS — T847XXA Infection and inflammatory reaction due to other internal orthopedic prosthetic devices, implants and grafts, initial encounter: Principal | ICD-10-CM

## 2013-08-20 DIAGNOSIS — S81009A Unspecified open wound, unspecified knee, initial encounter: Secondary | ICD-10-CM

## 2013-08-20 DIAGNOSIS — I10 Essential (primary) hypertension: Secondary | ICD-10-CM

## 2013-08-20 DIAGNOSIS — S81809A Unspecified open wound, unspecified lower leg, initial encounter: Secondary | ICD-10-CM

## 2013-08-20 LAB — GLUCOSE, CAPILLARY
GLUCOSE-CAPILLARY: 171 mg/dL — AB (ref 70–99)
GLUCOSE-CAPILLARY: 257 mg/dL — AB (ref 70–99)
GLUCOSE-CAPILLARY: 258 mg/dL — AB (ref 70–99)
Glucose-Capillary: 200 mg/dL — ABNORMAL HIGH (ref 70–99)

## 2013-08-20 LAB — HIV-1 RNA QUANT-NO REFLEX-BLD

## 2013-08-20 LAB — CBC
HEMATOCRIT: 39.9 % (ref 39.0–52.0)
HEMOGLOBIN: 13.7 g/dL (ref 13.0–17.0)
MCH: 31 pg (ref 26.0–34.0)
MCHC: 34.3 g/dL (ref 30.0–36.0)
MCV: 90.3 fL (ref 78.0–100.0)
Platelets: 161 10*3/uL (ref 150–400)
RBC: 4.42 MIL/uL (ref 4.22–5.81)
RDW: 13.4 % (ref 11.5–15.5)
WBC: 7.5 10*3/uL (ref 4.0–10.5)

## 2013-08-20 LAB — BASIC METABOLIC PANEL
BUN: 7 mg/dL (ref 6–23)
CO2: 28 meq/L (ref 19–32)
Calcium: 8.5 mg/dL (ref 8.4–10.5)
Chloride: 104 mEq/L (ref 96–112)
Creatinine, Ser: 0.55 mg/dL (ref 0.50–1.35)
GFR calc Af Amer: >90 mL/min (ref >90)
GFR calc non Af Amer: >90 mL/min (ref >90)
GLUCOSE: 167 mg/dL — AB (ref 70–99)
POTASSIUM: 3.5 meq/L — AB (ref 3.7–5.3)
SODIUM: 142 meq/L (ref 137–147)

## 2013-08-20 LAB — HEPATITIS PANEL, ACUTE
HCV AB: NEGATIVE
HEP B C IGM: NONREACTIVE
HEP B S AG: NEGATIVE
Hep A IgM: NONREACTIVE

## 2013-08-20 LAB — HIV ANTIBODY (ROUTINE TESTING W REFLEX): HIV 1&2 Ab, 4th Generation: NONREACTIVE

## 2013-08-20 MED ORDER — FLEET ENEMA 7-19 GM/118ML RE ENEM
1.0000 | ENEMA | Freq: Once | RECTAL | Status: AC
  Administered 2013-08-20: 1 via RECTAL
  Filled 2013-08-20: qty 1

## 2013-08-20 MED ORDER — POTASSIUM CHLORIDE CRYS ER 20 MEQ PO TBCR
40.0000 meq | EXTENDED_RELEASE_TABLET | Freq: Once | ORAL | Status: AC
  Administered 2013-08-20: 40 meq via ORAL
  Filled 2013-08-20: qty 2

## 2013-08-20 NOTE — Progress Notes (Signed)
PT Cancellation Note  Patient Details Name: Louis Peterson MRN: [id number] DOB: 06/12/1945   Cancelled Treatment:    Reason Eval/Treat Not Completed: Other (comment): Per nursing pt has been up all day in room with family and with multiple MD's in this am. Upon entering room pt found to sleeping soundly (snooring). Will follow up today as time allows vs tomorrow for stair training.    Sallyanne KusterKathy Bury 08/20/2013, 1:28 PM  Sallyanne KusterKathy Bury, PTA Office- [phone]

## 2013-08-20 NOTE — Progress Notes (Signed)
Patient ID: Louis Peterson, male   DOB: 11/21/1945, 68 y.o.   MRN: [id number]         [facility]    Date of Admission:  08/15/2013           Day 6 vancomycin        Day 3 cefazolin Principal Problem:   Cellulitis Active Problems:   Hyperglycemia   Elevated blood pressure   Type II or unspecified type diabetes mellitus without mention of complication, uncontrolled   .  ceFAZolin (ANCEF) IV  2 g Intravenous 3 times per day  . enoxaparin (LOVENOX) injection  40 mg Subcutaneous Q24H  . insulin aspart  0-15 Units Subcutaneous TID WC & HS  . insulin glargine  15 Units Subcutaneous Daily  . nystatin cream   Topical BID  . pneumococcal 23 valent vaccine  0.5 mL Intramuscular Tomorrow-1000  . polyethylene glycol  17 g Oral BID  . vancomycin  1,250 mg Intravenous Q8H    Objective: Temp:  [97.4 F (36.3 C)-98.1 F (36.7 C)] 97.8 F (36.6 C) (04/18 0631) Pulse Rate:  [77-88] 88 (04/18 0631) Resp:  [16-18] 18 (04/18 0631) BP: (116-141)/(59-81) 131/81 mmHg (04/18 0631) SpO2:  [94 %-96 %] 94 % (04/18 0631)  Lab Results Lab Results  Component Value Date   WBC 7.5 08/20/2013   HGB 13.7 08/20/2013   HCT 39.9 08/20/2013   MCV 90.3 08/20/2013   PLT 161 08/20/2013    Lab Results  Component Value Date   CREATININE 0.55 08/20/2013   BUN 7 08/20/2013   NA 142 08/20/2013   K 3.5* 08/20/2013   CL 104 08/20/2013   CO2 28 08/20/2013     Microbiology: Recent Results (from the past 240 hour(s))  SURGICAL PCR SCREEN     Status: None   Collection Time    08/18/13  6:57 AM      Result Value Ref Range Status   MRSA, PCR NEGATIVE  NEGATIVE Final   Staphylococcus aureus NEGATIVE  NEGATIVE Final   Comment:            The Xpert SA Assay (FDA     approved for NASAL specimens     in patients over 68 years of age),     is one component of     a comprehensive surveillance     program.  Test performance has     been validated by [facility] for patients  greater     than or equal to 68 year old.     It is not intended     to diagnose infection nor to     guide or monitor treatment.  TISSUE CULTURE     Status: None   Collection Time    08/18/13  1:44 PM      Result Value Ref Range Status   Specimen Description TISSUE LEFT LEG   Final   Special Requests DEEP TIBIAL CULTURE   Final   Gram Stain     Final   Value: ABUNDANT WBC PRESENT,BOTH PMN AND MONONUCLEAR     ABUNDANT GRAM POSITIVE COCCI IN CLUSTERS     Performed at [facility]   Culture     Final   Value: MODERATE STAPHYLOCOCCUS AUREUS     Note: RIFAMPIN AND GENTAMICIN SHOULD NOT BE USED AS SINGLE DRUGS FOR TREATMENT OF STAPH INFECTIONS.     Performed at [facility]   Report Status PENDING  Incomplete  AFB CULTURE WITH SMEAR     Status: None   Collection Time    08/18/13  1:44 PM      Result Value Ref Range Status   Specimen Description TISSUE LEFT LEG   Final   Special Requests DEEP TIBIAL CULTURE   Final   ACID FAST SMEAR     Final   Value: NO ACID FAST BACILLI SEEN     Performed at [facility]   Culture     Final   Value: CULTURE WILL BE EXAMINED FOR 6 WEEKS BEFORE ISSUING A FINAL REPORT     Performed at [facility]   Report Status PENDING   Incomplete  ANAEROBIC CULTURE     Status: None   Collection Time    08/18/13  1:44 PM      Result Value Ref Range Status   Specimen Description TISSUE LEFT LEG   Final   Special Requests DEEP TIBIAL CULTURE   Final   Gram Stain     Final   Value: ABUNDANT WBC PRESENT,BOTH PMN AND MONONUCLEAR     ABUNDANT GRAM POSITIVE COCCI IN CLUSTERS     Performed at [facility]   Culture     Final   Value: NO ANAEROBES ISOLATED; CULTURE IN PROGRESS FOR 5 DAYS     Performed at [facility]   Report Status PENDING   Incomplete   Assessment: Tissue cultures are growing staph aureus. I will continue current 2 drug antibiotic therapy pending antibiotic susceptibility  results.  Plan: 1. Continue current antibiotics pending final culture results  Cliffton AstersJohn Mahek Schlesinger, MD [facility] [phone] pager   [phone] cell 08/20/2013, 12:29 PM

## 2013-08-20 NOTE — Progress Notes (Signed)
MD, patient is requesting an enema.  He is having some constipation and his last BM was on 08/14/13. He tried several times last night but no success. Thank you.
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
## 2013-08-20 NOTE — Progress Notes (Signed)
SPORTS MEDICINE AND JOINT REPLACEMENT  Louis SpurlingStephen Lucey, MD   Louis CabalMaurice Ivaan Liddy, PA-C [address], [place], [place]  [id number]                             [phone]   PROGRESS NOTE  Subjective:  negative for Chest Pain  negative for Shortness of Breath  negative for Nausea/Vomiting   negative for Calf Pain  negative for Bowel Movement   Tolerating Diet: yes         Patient reports pain as 3 on 0-10 scale.    Objective: Vital signs in last 24 hours:   Patient Vitals for the past 24 hrs:  BP Temp Temp src Pulse Resp SpO2  08/20/13 0631 131/81 mmHg 97.8 F (36.6 C) Oral 88 18 94 %  08/19/13 2141 141/77 mmHg 98.1 F (36.7 C) Oral 85 18 95 %  08/19/13 1405 116/59 mmHg 97.4 F (36.3 C) - 77 16 96 %    @flow {1959:LAST@   Intake/Output from previous day:   04/17 0701 - 04/18 0700 In: 720 [P.O.:720] Out: 700 [Urine:700]   Intake/Output this shift:   04/18 0701 - 04/18 1900 In: 240 [P.O.:240] Out: -    Intake/Output     04/17 0701 - 04/18 0700 04/18 0701 - 04/19 0700   P.O. 720 240   I.V. (mL/kg)     IV Piggyback     Total Intake(mL/kg) 720 (7.3) 240 (2.4)   Urine (mL/kg/hr) 700 (0.3)    Blood     Total Output 700     Net +20 +240        Urine Occurrence 3 x       LABORATORY DATA:  Recent Labs  08/15/13 1953 08/16/13 0110 08/18/13 0306 08/19/13 0703 08/20/13 0448  WBC 10.1 11.4* 9.5 10.0 7.5  HGB 16.9 14.8 14.5 14.0 13.7  HCT 48.5 41.8 42.5 41.0 39.9  PLT 207 199 180 158 161    Recent Labs  08/15/13 1953 08/16/13 0110 08/18/13 0306 08/19/13 0703 08/20/13 0448  NA 137 139 142 141 142  K 4.6 5.0 3.3* 3.7 3.5*  CL 97 103 104 103 104  CO2 25 24 28 27 28   BUN 20 16 11 6 7   CREATININE 0.80 0.68 0.75 0.61 0.55  GLUCOSE 690* 376* 116* 137* 167*  CALCIUM 9.2 8.5 8.7 8.4 8.5   No results found for this basename: INR, PROTIME    Examination:  General appearance: alert, cooperative and no distress Extremities: Homans sign is negative, no sign  of DVT  Wound Exam: clean, dry, intact   Drainage:  None: wound tissue dry  Motor Exam: EHL and FHL Intact  Sensory Exam: Deep Peroneal normal   Assessment:    2 Days Post-Op  Procedure(s) (LRB): HARDWARE REMOVAL Left Tibial Nail (Left)  ADDITIONAL DIAGNOSIS:  Principal Problem:   Cellulitis Active Problems:   Hyperglycemia   Elevated blood pressure   Type II or unspecified type diabetes mellitus without mention of complication, uncontrolled     Plan: Continue therapy Weight Bearing as Tolerated (WBAT) Advance diet  Continue ABX per ID  Continue plan per medicine          Louis CabalMaurice Louis Peterson 08/20/2013, 8:36 AM
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
## 2013-08-21 DIAGNOSIS — T847XXA Infection and inflammatory reaction due to other internal orthopedic prosthetic devices, implants and grafts, initial encounter: Secondary | ICD-10-CM | POA: Diagnosis not present

## 2013-08-21 DIAGNOSIS — Z96698 Presence of other orthopedic joint implants: Secondary | ICD-10-CM | POA: Diagnosis not present

## 2013-08-21 DIAGNOSIS — IMO0001 Reserved for inherently not codable concepts without codable children: Secondary | ICD-10-CM | POA: Diagnosis not present

## 2013-08-21 DIAGNOSIS — A4901 Methicillin susceptible Staphylococcus aureus infection, unspecified site: Secondary | ICD-10-CM

## 2013-08-21 DIAGNOSIS — M869 Osteomyelitis, unspecified: Secondary | ICD-10-CM

## 2013-08-21 DIAGNOSIS — Z794 Long term (current) use of insulin: Secondary | ICD-10-CM | POA: Diagnosis not present

## 2013-08-21 DIAGNOSIS — I10 Essential (primary) hypertension: Secondary | ICD-10-CM | POA: Diagnosis not present

## 2013-08-21 DIAGNOSIS — Z452 Encounter for adjustment and management of vascular access device: Secondary | ICD-10-CM | POA: Diagnosis not present

## 2013-08-21 LAB — TISSUE CULTURE

## 2013-08-21 LAB — GLUCOSE, CAPILLARY
GLUCOSE-CAPILLARY: 192 mg/dL — AB (ref 70–99)
GLUCOSE-CAPILLARY: 251 mg/dL — AB (ref 70–99)
Glucose-Capillary: 288 mg/dL — ABNORMAL HIGH (ref 70–99)

## 2013-08-21 LAB — BASIC METABOLIC PANEL
BUN: 7 mg/dL (ref 6–23)
CALCIUM: 8.8 mg/dL (ref 8.4–10.5)
CO2: 27 mEq/L (ref 19–32)
CREATININE: 0.52 mg/dL (ref 0.50–1.35)
Chloride: 104 mEq/L (ref 96–112)
GFR calc Af Amer: >90 mL/min (ref >90)
Glucose, Bld: 192 mg/dL — ABNORMAL HIGH (ref 70–99)
Potassium: 3.6 mEq/L — ABNORMAL LOW (ref 3.7–5.3)
SODIUM: 141 meq/L (ref 137–147)

## 2013-08-21 MED ORDER — LISINOPRIL 10 MG PO TABS: 10.0000 mg | ORAL_TABLET | Freq: Every day | ORAL | Status: DC

## 2013-08-21 MED ORDER — HEPARIN SOD (PORK) LOCK FLUSH 100 UNIT/ML IV SOLN
250.0000 [IU] | INTRAVENOUS | Status: AC | PRN
  Administered 2013-08-21: 250 [IU]

## 2013-08-21 MED ORDER — POTASSIUM CHLORIDE CRYS ER 20 MEQ PO TBCR: 40.0000 meq | EXTENDED_RELEASE_TABLET | Freq: Every day | ORAL | Status: DC

## 2013-08-21 MED ORDER — HYDROCODONE-ACETAMINOPHEN 5-325 MG PO TABS: 1.0000 | ORAL_TABLET | ORAL | Status: DC | PRN

## 2013-08-21 MED ORDER — LISINOPRIL 10 MG PO TABS
10.0000 mg | ORAL_TABLET | Freq: Every day | ORAL | Status: DC
  Administered 2013-08-21: 10 mg via ORAL
  Filled 2013-08-21: qty 1

## 2013-08-21 MED ORDER — INSULIN GLARGINE 100 UNIT/ML ~~LOC~~ SOLN: 15.0000 [IU] | Freq: Every day | SUBCUTANEOUS | Status: DC

## 2013-08-21 MED ORDER — ASPIRIN EC 325 MG PO TBEC: 325.0000 mg | DELAYED_RELEASE_TABLET | Freq: Every day | ORAL | Status: DC

## 2013-08-21 MED ORDER — SENNOSIDES-DOCUSATE SODIUM 8.6-50 MG PO TABS: 1.0000 | ORAL_TABLET | Freq: Two times a day (BID) | ORAL | Status: DC

## 2013-08-21 MED ORDER — FLEET ENEMA 7-19 GM/118ML RE ENEM
1.0000 | ENEMA | Freq: Once | RECTAL | Status: AC
  Administered 2013-08-21: 1 via RECTAL
  Filled 2013-08-21: qty 1

## 2013-08-21 MED ORDER — POTASSIUM CHLORIDE CRYS ER 20 MEQ PO TBCR
40.0000 meq | EXTENDED_RELEASE_TABLET | Freq: Once | ORAL | Status: AC
  Administered 2013-08-21: 40 meq via ORAL
  Filled 2013-08-21: qty 2

## 2013-08-21 MED ORDER — CEFAZOLIN SODIUM-DEXTROSE 2-3 GM-% IV SOLR: 2.0000 g | Freq: Three times a day (TID) | INTRAVENOUS | Status: DC

## 2013-08-21 MED ORDER — SENNOSIDES-DOCUSATE SODIUM 8.6-50 MG PO TABS
1.0000 | ORAL_TABLET | Freq: Two times a day (BID) | ORAL | Status: DC
  Administered 2013-08-21: 1 via ORAL
  Filled 2013-08-21: qty 1

## 2013-08-21 MED ORDER — NYSTATIN 100000 UNIT/GM EX CREA: TOPICAL_CREAM | Freq: Two times a day (BID) | CUTANEOUS | Status: DC

## 2013-08-21 MED ORDER — POLYETHYLENE GLYCOL 3350 17 G PO PACK: 17.0000 g | PACK | Freq: Two times a day (BID) | ORAL | Status: DC

## 2013-08-21 NOTE — Progress Notes (Signed)
Physical Therapy Treatment and Discharge Patient Details Name: Louis Peterson MRN: [id number] DOB: 11/08/1945 Today's Date: 08/21/2013    History of Present Illness Patient is a 68 yo male, spanish speaking, s/p hardware removal from Lt tibia due to infection/cellulitis (tibial fx with nailing in 2010).     PT Comments    Pt demonstrated modified independence using RW for essential mobility including ascend/descend stairs with sufficient family support.  Left note on chart to be sure pt has necessary DME for d/c today.  No other needs.  Follow Up Recommendations  No PT follow up;Supervision/Assistance - 24 hour     Equipment Recommendations  Rolling walker with 5" wheels;3in1 (PT)    Recommendations for Other Services       Precautions / Restrictions      Mobility  Bed Mobility                  Transfers Overall transfer level: Modified independent Equipment used: Rolling walker (2 wheeled) Transfers: Sit to/from Stand Sit to Stand: Modified independent (Device/Increase time)            Ambulation/Gait Ambulation/Gait assistance: Modified independent (Device/Increase time) Ambulation Distance (Feet): 200 Feet Assistive device: Rolling walker (2 wheeled) Gait Pattern/deviations: Step-to pattern;Step-through pattern;Decreased stance time - left Gait velocity: Decreased Gait velocity interpretation: Below normal speed for age/gender     Stairs Stairs: Yes Stairs assistance: Supervision Stair Management: With walker;Backwards;No rails Number of Stairs: 3 General stair comments: demonstrational cues, pt able to return demonstrate with minimal to no physical difficutly  Wheelchair Mobility    Modified Rankin (Stroke Patients Only)       Balance                                    Cognition Arousal/Alertness: Awake/alert Behavior During Therapy: WFL for tasks assessed/performed Overall Cognitive Status: Within Functional  Limits for tasks assessed                      Exercises      General Comments        Pertinent Vitals/Pain minimal    Home Living                      Prior Function            PT Goals (current goals can now be found in the care plan section) Progress towards PT goals: Goals met/education completed, patient discharged from PT    Frequency       PT Plan Current plan remains appropriate    Co-evaluation             End of Session   Activity Tolerance: Patient tolerated treatment well Patient left: in chair;with call bell/phone within reach;with family/visitor present     Time: 1513-1543 PT Time Calculation (min): 30 min  Charges:  $Gait Training: 23-37 mins                    G Codes:      Jennifer Galloway Martin 08/21/2013, 3:47 PM   

## 2013-08-21 NOTE — Progress Notes (Signed)
Patient ID: [name], male   DOB: 09/10/1945, 68 y.o.   MRN: [id number]         [facility]    Date of Admission:  08/15/2013           Day 7 vancomycin        Day 4 cefazolin Principal Problem:   Cellulitis Active Problems:   Hyperglycemia   Elevated blood pressure   Type II or unspecified type diabetes mellitus without mention of complication, uncontrolled   .  ceFAZolin (ANCEF) IV  2 g Intravenous 3 times per day  . enoxaparin (LOVENOX) injection  40 mg Subcutaneous Q24H  . insulin aspart  0-15 Units Subcutaneous TID WC & HS  . insulin glargine  15 Units Subcutaneous Daily  . lisinopril  10 mg Oral Daily  . nystatin cream   Topical BID  . pneumococcal 23 valent vaccine  0.5 mL Intramuscular Tomorrow-1000  . polyethylene glycol  17 g Oral BID  . senna-docusate  1 tablet Oral BID    Objective: Temp:  [98.1 F (36.7 C)-98.2 F (36.8 C)] 98.2 F (36.8 C) (04/19 1333) Pulse Rate:  [68-97] 97 (04/19 1333) Resp:  [18-20] 20 (04/19 1333) BP: (144-151)/(73-75) 151/75 mmHg (04/19 1333) SpO2:  [97 %-98 %] 98 % (04/19 1333)  Lab Results Lab Results  Component Value Date   WBC 7.5 08/20/2013   HGB 13.7 08/20/2013   HCT 39.9 08/20/2013   MCV 90.3 08/20/2013   PLT 161 08/20/2013    Lab Results  Component Value Date   CREATININE 0.52 08/21/2013   BUN 7 08/21/2013   NA 141 08/21/2013   K 3.6* 08/21/2013   CL 104 08/21/2013   CO2 27 08/21/2013     Microbiology: Recent Results (from the past 240 hour(s))  SURGICAL PCR SCREEN     Status: None   Collection Time    08/18/13  6:57 AM      Result Value Ref Range Status   MRSA, PCR NEGATIVE  NEGATIVE Final   Staphylococcus aureus NEGATIVE  NEGATIVE Final   Comment:            The Xpert SA Assay (FDA     approved for NASAL specimens     in patients over 68 years of age),     is one component of     a comprehensive surveillance     program.  Test performance has     been validated by  [facility] for patients greater     than or equal to 68 year old.     It is not intended     to diagnose infection nor to     guide or monitor treatment.  TISSUE CULTURE     Status: None   Collection Time    08/18/13  1:44 PM      Result Value Ref Range Status   Specimen Description TISSUE LEFT LEG   Final   Special Requests DEEP TIBIAL CULTURE   Final   Gram Stain     Final   Value: ABUNDANT WBC PRESENT,BOTH PMN AND MONONUCLEAR     ABUNDANT GRAM POSITIVE COCCI IN CLUSTERS     Performed at [facility]   Culture     Final   Value: MODERATE STAPHYLOCOCCUS AUREUS     Note: RIFAMPIN AND GENTAMICIN SHOULD NOT BE USED AS SINGLE DRUGS FOR TREATMENT OF STAPH INFECTIONS. This organism DOES NOT demonstrate inducible  Clindamycin resistance in vitro.     Performed at [facility]   Report Status 08/21/2013 FINAL   Final   Organism ID, Bacteria STAPHYLOCOCCUS AUREUS   Final  AFB CULTURE WITH SMEAR     Status: None   Collection Time    08/18/13  1:44 PM      Result Value Ref Range Status   Specimen Description TISSUE LEFT LEG   Final   Special Requests DEEP TIBIAL CULTURE   Final   ACID FAST SMEAR     Final   Value: NO ACID FAST BACILLI SEEN     Performed at [facility]   Culture     Final   Value: CULTURE WILL BE EXAMINED FOR 6 WEEKS BEFORE ISSUING A FINAL REPORT     Performed at [facility]   Report Status PENDING   Incomplete  ANAEROBIC CULTURE     Status: None   Collection Time    08/18/13  1:44 PM      Result Value Ref Range Status   Specimen Description TISSUE LEFT LEG   Final   Special Requests DEEP TIBIAL CULTURE   Final   Gram Stain     Final   Value: ABUNDANT WBC PRESENT,BOTH PMN AND MONONUCLEAR     ABUNDANT GRAM POSITIVE COCCI IN CLUSTERS     Performed at [facility]   Culture     Final   Value: NO ANAEROBES ISOLATED; CULTURE IN PROGRESS FOR 5 DAYS     Performed at [facility]   Report Status PENDING   Incomplete    Assessment: Tissue cultures grew MSSA. I will continue IV cefazolin as a single agent. He will need at least 6 weeks of therapy.  Plan: 1. Continue cefazolin. 2. Discontinue vancomycin  Cliffton AstersJohn Sidrah Harden, MD [facility] [phone] pager   [phone] cell 08/21/2013, 3:03 PM

## 2013-08-21 NOTE — Discharge Summary (Signed)
Physician Discharge Summary  [place] HLK:[id number] DOB: March 21, 1946 DOA: 08/15/2013  PCP: No primary provider on file.  Admit date: 08/15/2013 Discharge date: 08/21/2013  Time spent: 35  minutes  Recommendations for Outpatient Follow-up:  1. Needs further titration of insulin.  2. Need monitor BP 3. Needs 6 weeks at least of IV antibiotics.  4. Needs to follow up with Dr Percell Miller and ID, Dr Megan Salon.   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   Filed Weights   08/16/13 0034 08/17/13 0447 08/18/13 0400  Weight: 97.251 kg (214 lb 6.4 oz) 98.385 kg (216 lb 14.4 oz) 98.277 kg (216 lb 10.6 oz)    History of present illness:  Louis Peterson is a 68 y.o. male was brought to the ER in [place] because of increasing pain and darkness of the skin the left lower extremity over the last many weeks which has worsened last few days. Patient also has been having increasing urination last few weeks. X-rays show that patient's previous screws from previous ORIF has been retracted. In addition patient's blood sugar was found to be increased. Patient did not have any nausea vomiting abdominal pain diarrhea chest pain or shortness of breath. Did not have any fever chills. On examination in addition patient was found to have possible fungal infection around his prepuce.   Hospital Course:  Osteomyelitis tibia, hardware infection tibial nail.  Patient S/P Hardware removal tibial nail 4-16.  Culture grew MSSA.  Needs 6 weeks IV antibiotics.  Received vancomycin for 7 days. 4 days of cefazolin.  ESR 30---1.  WBC normalized.  Appreciate Dr Percell Miller and Dr Baxter Flattery assistance.   New onset diabetes mellitus with severe hyperglycemia  A1c at 12. . Needs diabetic  teaching and lifestyle modifications.  Diabetic coordinator consult.  Continue with SSI. Continue with lantus.   PVD; hyperpigmentation. Arterial doppler ABI; Non Compressible. Vascular was consulted. No need for further work up or follow up with vascular.   Hypertension  Resume  Lisinopril.  Hypokalemia; replaced.   Constipation; Had one BM. Repeat enema prior to discharge.   DVT prophylaxis; aspirin on discharge.   Obesity  Counseled on weight loss and exercise   Procedures: Patient S/P Hardware removal tibial nail 4-16   Consultations:  ID  Dr Percell Miller  Discharge Exam: Filed Vitals:   08/21/13 0642  BP: 151/73  Pulse: 68  Temp: 98.2 F (36.8 C)  Resp: 18    General: no distress. Cardiovascular: S 1, S 2 RRR Respiratory: CTA  Discharge Instructions You were cared for by a hospitalist during your hospital stay. If you have any questions about your discharge medications or the care you received while you were in the hospital after you are discharged, you can call the unit and asked to speak with the hospitalist on call if the hospitalist that took care of you is not available. Once you are discharged, your primary care physician will handle any further medical issues. Please note that NO REFILLS for any discharge medications will be authorized once you are discharged, as it is imperative that you return to your primary care physician (or establish a relationship with a primary care physician if you do not have one) for your aftercare needs so that they can reassess your need for medications and monitor your lab values.  Discharge Orders  Future Appointments Provider Department Dept Phone   08/26/2013 5:00 PM Chari Manning, NP [place] [phone]   Future Orders Complete By Expires   Diet Carb Modified  As directed    Increase activity slowly  As directed        Medication List    STOP taking these medications       UNABLE TO  FIND      TAKE these medications       aspirin EC 325 MG tablet  Take 1 tablet (325 mg total) by mouth daily.     ceFAZolin 2-3 GM-% Solr  Commonly known as:  ANCEF  Inject 50 mLs (2 g total) into the vein every 8 (eight) hours.     HYDROcodone-acetaminophen 5-325 MG per tablet  Commonly known as:  NORCO/VICODIN  Take 1 tablet by mouth every 4 (four) hours as needed for moderate pain.     insulin glargine 100 UNIT/ML injection  Commonly known as:  LANTUS  Inject 0.15 mLs (15 Units total) into the skin daily.     lisinopril 10 MG tablet  Commonly known as:  PRINIVIL,ZESTRIL  Take 1 tablet (10 mg total) by mouth daily.     nystatin cream  Commonly known as:  MYCOSTATIN  Apply topically 2 (two) times daily.     polyethylene glycol packet  Commonly known as:  MIRALAX / GLYCOLAX  Take 17 g by mouth 2 (two) times daily.     senna-docusate 8.6-50 MG per tablet  Commonly known as:  Senokot-S  Take 1 tablet by mouth 2 (two) times daily.       No Known Allergies     Follow-up Information   Follow up with MURPHY, TIMOTHY, D, MD In 1 week.   Specialty:  Orthopedic Surgery   Contact information:   [address] [place] [address] [phone]       Please follow up. (CM arranged appointment at [facility] for patient to get established with PCP. Friday, April 24,2015 at 5:00pm with Chari Manning, NP.)       Follow up with Michel Bickers, MD In 6 weeks.   Specialty:  Infectious Diseases   Contact information:   [address] [place] [address] [phone]        The results of significant diagnostics from this hospitalization (including imaging, microbiology, ancillary and laboratory) are listed below for reference.    Significant Diagnostic Studies: Dg Tibia/fibula Left  08/18/2013   CLINICAL DATA:  Hardware removal  EXAM: LEFT TIBIA AND FIBULA - 2 VIEW  COMPARISON:  Portable exam 1523 hr compared to  the intraoperative images of 08/18/2013  FINDINGS: Pain tracks from prior hardware noted in tibia.  Healed oblique fracture of the middle third left tibia.  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  Osseous demineralization.  No additional focal bony abnormality seen.  IMPRESSION: No acute abnormalities as above.  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   Electronically Signed   By: Lavonia Dana M.D.   On: 08/18/2013 16:18   Dg Tibia/fibula Left  08/18/2013   CLINICAL DATA:  Hardware removed  EXAM: LEFT TIBIA AND FIBULA - 2 VIEW  COMPARISON:  DG TIBIA/FIBULA*L* dated 08/15/2013  FINDINGS: The intra medullary rod has been removed from the tibia. The appearance of the tibia and distal fibular  fracture is stable.  IMPRESSION: Removal of the intra medullary rod from the tibia.   Electronically Signed   By: Maryclare Bean M.D.   On: 08/18/2013 16:05   Dg Tibia/fibula Left  08/15/2013   CLINICAL DATA:  Pain and swelling.  EXAM: LEFT TIBIA AND FIBULA - 2 VIEW  COMPARISON:  DG ANKLE COMPLETE*L* dated 08/15/2013; DG TIBIA/FIBULA*L* dated 03/19/2009; DG ANKLE COMPLETE*L* dated 03/19/2009; DG FOOT COMPLETE*L* dated 03/19/2009  FINDINGS: Proximal aspect of the intramedullary rod and proximal screws show stable alignment with no evidence of acute fracture. No abnormal lucency is seen surrounding hardware. Deformity of the fibular diaphysis present. Midshaft tibial fracture has healed completely.  IMPRESSION: The proximal aspect of prior tibial ORIF appears stable.   Electronically Signed   By: Aletta Edouard M.D.   On: 08/15/2013 22:19   Dg Ankle Complete Left  08/15/2013   CLINICAL DATA:  Left ankle pain.  EXAM: LEFT ANKLE COMPLETE - 3+ VIEW  COMPARISON:  DG TIBIA/FIBULA*L* dated 03/19/2009; DG ANKLE COMPLETE*L* dated 03/19/2009; DG FOOT COMPLETE*L* dated 03/19/2009  FINDINGS: Distal aspect of an intra medullary rod and distal  tibial screws show retraction of the more superior transversely oriented tibial screw out of the bone compared to studies from 2010. The head of the screw now projects approximately 1.4 cm the on the cortex of the medial tibia and there does appear to be some overlying soft tissue swelling. No acute fractures identified. No bone destruction or abnormal bony lucency identified. Partially healed deformity of the fibular diaphysis noted.  IMPRESSION: Since prior studies in 2010, the superior of 2 separate transverse distal tibial screws has retracted out into the soft tissues by a roughly 14 mm beyond the cortex of the bone. This does cause overlying soft tissue swelling.   Electronically Signed   By: Aletta Edouard M.D.   On: 08/15/2013 22:15   Dg C-arm 1-60 Min  08/18/2013   CLINICAL DATA:  Hardware removal  EXAM: DG C-ARM 1-60 MIN  COMPARISON:  DG TIBIA/FIBULA*L* dated 08/15/2013  FINDINGS: The intra medullary rod has been removed within the tibia. The appearance of the tibia and fibula is stable.  IMPRESSION: Removal of the intra medullary rod from the tibia.   Electronically Signed   By: Maryclare Bean M.D.   On: 08/18/2013 16:04    Microbiology: Recent Results (from the past 240 hour(s))  SURGICAL PCR SCREEN     Status: None   Collection Time    08/18/13  6:57 AM      Result Value Ref Range Status   MRSA, PCR NEGATIVE  NEGATIVE Final   Staphylococcus aureus NEGATIVE  NEGATIVE Final   Comment:            The Xpert SA Assay (FDA     approved for NASAL specimens     in patients over 28 years of age),     is one component of     a comprehensive surveillance     program.  Test performance has     been validated by [employer] for patients greater     than or equal to 68 year old.     It is not intended     to diagnose infection nor to     guide or monitor treatment.  TISSUE CULTURE     Status: None   Collection Time    08/18/13  1:44 PM      Result Value Ref Range Status  Specimen  Description TISSUE LEFT LEG   Final   Special Requests DEEP TIBIAL CULTURE   Final   Gram Stain     Final   Value: ABUNDANT WBC PRESENT,BOTH PMN AND MONONUCLEAR     ABUNDANT GRAM POSITIVE COCCI IN CLUSTERS     Performed at [facility]   Culture     Final   Value: MODERATE STAPHYLOCOCCUS AUREUS     Note: RIFAMPIN AND GENTAMICIN SHOULD NOT BE USED AS SINGLE DRUGS FOR TREATMENT OF STAPH INFECTIONS. This organism DOES NOT demonstrate inducible Clindamycin resistance in vitro.     Performed at [facility]   Report Status 08/21/2013 FINAL   Final   Organism ID, Bacteria STAPHYLOCOCCUS AUREUS   Final  AFB CULTURE WITH SMEAR     Status: None   Collection Time    08/18/13  1:44 PM      Result Value Ref Range Status   Specimen Description TISSUE LEFT LEG   Final   Special Requests DEEP TIBIAL CULTURE   Final   ACID FAST SMEAR     Final   Value: NO ACID FAST BACILLI SEEN     Performed at [facility]   Culture     Final   Value: CULTURE WILL BE EXAMINED FOR 6 WEEKS BEFORE ISSUING A FINAL REPORT     Performed at [facility]   Report Status PENDING   Incomplete  ANAEROBIC CULTURE     Status: None   Collection Time    08/18/13  1:44 PM      Result Value Ref Range Status   Specimen Description TISSUE LEFT LEG   Final   Special Requests DEEP TIBIAL CULTURE   Final   Gram Stain     Final   Value: ABUNDANT WBC PRESENT,BOTH PMN AND MONONUCLEAR     ABUNDANT GRAM POSITIVE COCCI IN CLUSTERS     Performed at [facility]   Culture     Final   Value: NO ANAEROBES ISOLATED; CULTURE IN PROGRESS FOR 5 DAYS     Performed at [facility]   Report Status PENDING   Incomplete     Labs: Basic Metabolic Panel:  Recent Labs Lab 08/16/13 0110 08/18/13 0306 08/19/13 0703 08/20/13 0448 08/21/13 0604  NA 139 142 141 142 141  K 5.0 3.3* 3.7 3.5* 3.6*  CL 103 104 103 104 104  CO2 24 28 27 28 27   GLUCOSE 376* 116* 137* 167* 192*  BUN 16 11 6 7 7    CREATININE 0.68 0.75 0.61 0.55 0.52  CALCIUM 8.5 8.7 8.4 8.5 8.8   Liver Function Tests:  Recent Labs Lab 08/15/13 1953  AST 22  ALT 33  ALKPHOS 178*  BILITOT 0.3  PROT 7.4  ALBUMIN 3.0*   No results found for this basename: LIPASE, AMYLASE,  in the last 168 hours No results found for this basename: AMMONIA,  in the last 168 hours CBC:  Recent Labs Lab 08/15/13 1953 08/16/13 0110 08/18/13 0306 08/19/13 0703 08/20/13 0448  WBC 10.1 11.4* 9.5 10.0 7.5  NEUTROABS 7.3 7.7  --   --   --   HGB 16.9 14.8 14.5 14.0 13.7  HCT 48.5 41.8 42.5 41.0 39.9  MCV 90.1 88.6 89.9 90.7 90.3  PLT 207 199 180 158 161   Cardiac Enzymes: No results found for this basename: CKTOTAL, CKMB, CKMBINDEX, TROPONINI,  in the last 168 hours BNP: BNP (last 3 results) No results found for  this basename: PROBNP,  in the last 8760 hours CBG:  Recent Labs Lab 08/20/13 0658 08/20/13 1123 08/20/13 1607 08/20/13 2249 08/21/13 0643  GLUCAP 171* 257* 200* 258* 192*       Signed:  Guiliana Shor A Ellaina Schuler  [facility] 08/21/2013, 8:56 AM

## 2013-08-22 ENCOUNTER — Encounter (HOSPITAL_COMMUNITY): Payer: Self-pay | Admitting: Orthopedic Surgery

## 2013-08-22 DIAGNOSIS — Z452 Encounter for adjustment and management of vascular access device: Secondary | ICD-10-CM | POA: Diagnosis not present

## 2013-08-22 DIAGNOSIS — T847XXA Infection and inflammatory reaction due to other internal orthopedic prosthetic devices, implants and grafts, initial encounter: Secondary | ICD-10-CM | POA: Diagnosis not present

## 2013-08-22 DIAGNOSIS — I10 Essential (primary) hypertension: Secondary | ICD-10-CM | POA: Diagnosis not present

## 2013-08-22 DIAGNOSIS — IMO0001 Reserved for inherently not codable concepts without codable children: Secondary | ICD-10-CM | POA: Diagnosis not present

## 2013-08-22 DIAGNOSIS — Z794 Long term (current) use of insulin: Secondary | ICD-10-CM | POA: Diagnosis not present

## 2013-08-22 DIAGNOSIS — Z96698 Presence of other orthopedic joint implants: Secondary | ICD-10-CM | POA: Diagnosis not present

## 2013-08-23 DIAGNOSIS — Z96698 Presence of other orthopedic joint implants: Secondary | ICD-10-CM | POA: Diagnosis not present

## 2013-08-23 DIAGNOSIS — Z452 Encounter for adjustment and management of vascular access device: Secondary | ICD-10-CM | POA: Diagnosis not present

## 2013-08-23 DIAGNOSIS — T847XXA Infection and inflammatory reaction due to other internal orthopedic prosthetic devices, implants and grafts, initial encounter: Secondary | ICD-10-CM | POA: Diagnosis not present

## 2013-08-23 DIAGNOSIS — I10 Essential (primary) hypertension: Secondary | ICD-10-CM | POA: Diagnosis not present

## 2013-08-23 DIAGNOSIS — Z794 Long term (current) use of insulin: Secondary | ICD-10-CM | POA: Diagnosis not present

## 2013-08-23 DIAGNOSIS — IMO0001 Reserved for inherently not codable concepts without codable children: Secondary | ICD-10-CM | POA: Diagnosis not present

## 2013-08-23 LAB — ANAEROBIC CULTURE

## 2013-08-25 DIAGNOSIS — IMO0001 Reserved for inherently not codable concepts without codable children: Secondary | ICD-10-CM | POA: Diagnosis not present

## 2013-08-25 DIAGNOSIS — Z794 Long term (current) use of insulin: Secondary | ICD-10-CM | POA: Diagnosis not present

## 2013-08-25 DIAGNOSIS — I10 Essential (primary) hypertension: Secondary | ICD-10-CM | POA: Diagnosis not present

## 2013-08-25 DIAGNOSIS — Z96698 Presence of other orthopedic joint implants: Secondary | ICD-10-CM | POA: Diagnosis not present

## 2013-08-25 DIAGNOSIS — Z452 Encounter for adjustment and management of vascular access device: Secondary | ICD-10-CM | POA: Diagnosis not present

## 2013-08-25 DIAGNOSIS — T847XXA Infection and inflammatory reaction due to other internal orthopedic prosthetic devices, implants and grafts, initial encounter: Secondary | ICD-10-CM | POA: Diagnosis not present

## 2013-08-26 ENCOUNTER — Inpatient Hospital Stay: Payer: Medicare Other | Admitting: Internal Medicine

## 2013-08-30 DIAGNOSIS — IMO0001 Reserved for inherently not codable concepts without codable children: Secondary | ICD-10-CM | POA: Diagnosis not present

## 2013-08-30 DIAGNOSIS — Z794 Long term (current) use of insulin: Secondary | ICD-10-CM | POA: Diagnosis not present

## 2013-08-30 DIAGNOSIS — I10 Essential (primary) hypertension: Secondary | ICD-10-CM | POA: Diagnosis not present

## 2013-08-30 DIAGNOSIS — T847XXA Infection and inflammatory reaction due to other internal orthopedic prosthetic devices, implants and grafts, initial encounter: Secondary | ICD-10-CM | POA: Diagnosis not present

## 2013-08-30 DIAGNOSIS — Z96698 Presence of other orthopedic joint implants: Secondary | ICD-10-CM | POA: Diagnosis not present

## 2013-08-30 DIAGNOSIS — Z452 Encounter for adjustment and management of vascular access device: Secondary | ICD-10-CM | POA: Diagnosis not present

## 2013-08-31 DIAGNOSIS — T847XXA Infection and inflammatory reaction due to other internal orthopedic prosthetic devices, implants and grafts, initial encounter: Secondary | ICD-10-CM | POA: Diagnosis not present

## 2013-08-31 DIAGNOSIS — B999 Unspecified infectious disease: Secondary | ICD-10-CM | POA: Diagnosis not present

## 2013-09-06 DIAGNOSIS — Z794 Long term (current) use of insulin: Secondary | ICD-10-CM | POA: Diagnosis not present

## 2013-09-06 DIAGNOSIS — Z96698 Presence of other orthopedic joint implants: Secondary | ICD-10-CM | POA: Diagnosis not present

## 2013-09-06 DIAGNOSIS — T847XXA Infection and inflammatory reaction due to other internal orthopedic prosthetic devices, implants and grafts, initial encounter: Secondary | ICD-10-CM | POA: Diagnosis not present

## 2013-09-06 DIAGNOSIS — IMO0001 Reserved for inherently not codable concepts without codable children: Secondary | ICD-10-CM | POA: Diagnosis not present

## 2013-09-06 DIAGNOSIS — I10 Essential (primary) hypertension: Secondary | ICD-10-CM | POA: Diagnosis not present

## 2013-09-06 DIAGNOSIS — Z452 Encounter for adjustment and management of vascular access device: Secondary | ICD-10-CM | POA: Diagnosis not present

## 2013-09-08 ENCOUNTER — Telehealth: Payer: Self-pay | Admitting: *Deleted

## 2013-09-08 ENCOUNTER — Encounter: Payer: Self-pay | Admitting: Internal Medicine

## 2013-09-08 ENCOUNTER — Ambulatory Visit: Payer: Medicare Other | Attending: Internal Medicine | Admitting: Internal Medicine

## 2013-09-08 VITALS — BP 117/72 | HR 59 | Resp 16 | Ht 65.0 in | Wt 218.0 lb

## 2013-09-08 DIAGNOSIS — E119 Type 2 diabetes mellitus without complications: Secondary | ICD-10-CM | POA: Diagnosis not present

## 2013-09-08 DIAGNOSIS — Z79899 Other long term (current) drug therapy: Secondary | ICD-10-CM | POA: Insufficient documentation

## 2013-09-08 DIAGNOSIS — IMO0001 Reserved for inherently not codable concepts without codable children: Secondary | ICD-10-CM | POA: Diagnosis not present

## 2013-09-08 DIAGNOSIS — K5900 Constipation, unspecified: Secondary | ICD-10-CM | POA: Insufficient documentation

## 2013-09-08 DIAGNOSIS — E1165 Type 2 diabetes mellitus with hyperglycemia: Secondary | ICD-10-CM

## 2013-09-08 DIAGNOSIS — L03119 Cellulitis of unspecified part of limb: Principal | ICD-10-CM

## 2013-09-08 DIAGNOSIS — N400 Enlarged prostate without lower urinary tract symptoms: Secondary | ICD-10-CM | POA: Insufficient documentation

## 2013-09-08 DIAGNOSIS — L02419 Cutaneous abscess of limb, unspecified: Secondary | ICD-10-CM | POA: Insufficient documentation

## 2013-09-08 DIAGNOSIS — I10 Essential (primary) hypertension: Secondary | ICD-10-CM | POA: Diagnosis not present

## 2013-09-08 MED ORDER — INSULIN GLARGINE 100 UNIT/ML ~~LOC~~ SOLN: 15.0000 [IU] | Freq: Every day | SUBCUTANEOUS | Status: DC

## 2013-09-08 MED ORDER — ASPIRIN EC 325 MG PO TBEC: 325.0000 mg | DELAYED_RELEASE_TABLET | Freq: Every day | ORAL | Status: DC

## 2013-09-08 MED ORDER — LISINOPRIL 10 MG PO TABS: 10.0000 mg | ORAL_TABLET | Freq: Every day | ORAL | Status: DC

## 2013-09-08 NOTE — Progress Notes (Signed)
Pt here to establish care for newly diagnosed Diabetes Mellitus on insulin Lantus 15 units daily A1c done -12.4 Pt has right upper single Picc line Pt need reorder Lancet/Strips VSS. Afebrile.denies pain at this time Daughter interpretor for father

## 2013-09-08 NOTE — Telephone Encounter (Signed)
Tiffany [facility]) called wanting to know if patient can have a prescription for his lancets and test strips because the patient is paying out of pocket for these items. Informed Tiffany patient has an appointment today (09/08/2013) at 11:00 AM and to ask the PCP for a prescription for the lancets and test strips. Tiffany informed me the patient speaks spanish. Left a note for [facility]. Reather LaurenceJamie R Arlisa Leclere, RN

## 2013-09-08 NOTE — Progress Notes (Signed)
Patient ID: Louis DenseJ  [name], male   DOB: 09/04/1945, 68 y.o.   MRN: [id number]  CC: Leg infection  HPI: 68 year old male with past medical history of uncontrolled diabetes, hypertension who presented to clinic for followup. Patient reports he needs refills on cefazolin but explained to him this is done through home health nurse which they confirmed is following with antibiotic administration. Patient has left lower extremity infection and is on IV antibiotics. Per patient this infection is slowly improving. No complaints of chest pain or shortness of breath. No blurry vision. No falls.  No Known Allergies Past Medical History  Diagnosis Date  . Enlarged prostate    Current Outpatient Prescriptions on File Prior to Visit  Medication Sig Dispense Refill  . ceFAZolin (ANCEF) 2-3 GM-% SOLR Inject 50 mLs (2 g total) into the vein every 8 (eight) hours.  252 mL  0  . HYDROcodone-acetaminophen (NORCO/VICODIN) 5-325 MG per tablet Take 1 tablet by mouth every 4 (four) hours as needed for moderate pain.  30 tablet  0  . potassium chloride SA (K-DUR,KLOR-CON) 20 MEQ tablet Take 2 tablets (40 mEq total) by mouth daily.  2 tablet  0  . senna-docusate (SENOKOT-S) 8.6-50 MG per tablet Take 1 tablet by mouth 2 (two) times daily.  30 tablet  0  . nystatin cream (MYCOSTATIN) Apply topically 2 (two) times daily.  30 g  0  . polyethylene glycol (MIRALAX / GLYCOLAX) packet Take 17 g by mouth 2 (two) times daily.  14 each  0   No current facility-administered medications on file prior to visit.   Family History  Problem Relation Age of Onset  . Diabetes Mellitus II Neg Hx   . CAD Neg Hx    History   Social History  . Marital Status: Married    Spouse Name: N/A    Number of Children: N/A  . Years of Education: N/A   Occupational History  . Not on file.   Social History Main Topics  . Smoking status: Never Smoker   . Smokeless tobacco: Not on file  . Alcohol Use: No  . Drug Use: No  .  Sexual Activity: Not on file   Other Topics Concern  . Not on file   Social History Narrative  . No narrative on file    Review of Systems  Constitutional: Negative for fever, chills, diaphoresis, activity change, appetite change and fatigue.  HENT: Negative for ear pain, nosebleeds, congestion, facial swelling, rhinorrhea, neck pain, neck stiffness and ear discharge.   Eyes: Negative for pain, discharge, redness, itching and visual disturbance.  Respiratory: Negative for cough, choking, chest tightness, shortness of breath, wheezing and stridor.   Cardiovascular: Negative for chest pain, palpitations and leg swelling.  Gastrointestinal: Negative for abdominal distention.  Genitourinary: Negative for dysuria, urgency, frequency, hematuria, flank pain, decreased urine volume, difficulty urinating and dyspareunia.  Musculoskeletal: Negative for back pain, joint swelling, arthralgias and gait problem.  Neurological: Negative for dizziness, tremors, seizures, syncope, facial asymmetry, speech difficulty, weakness, light-headedness, numbness and headaches.  Hematological: Negative for adenopathy. Does not bruise/bleed easily.  Psychiatric/Behavioral: Negative for hallucinations, behavioral problems, confusion, dysphoric mood, decreased concentration and agitation.    Objective:   Filed Vitals:   09/08/13 1128  BP: 117/72  Pulse: 59  Resp: 16    Physical Exam  Constitutional: Appears well-developed and well-nourished. No distress.  HENT: Normocephalic. External right and left ear normal. Oropharynx is clear and moist.  Eyes: Conjunctivae and EOM are  normal. PERRLA, no scleral icterus.  Neck: Normal ROM. Neck supple. No JVD. No tracheal deviation. No thyromegaly.  CVS: RRR, S1/S2 +, no murmurs, no gallops, no carotid bruit.  Pulmonary: Effort and breath sounds normal, no stridor, rhonchi, wheezes, rales.  Abdominal: Soft. BS +,  no distension, tenderness, rebound or guarding.   Musculoskeletal: Normal range of motion. No edema and no tenderness.  Lymphadenopathy: No lymphadenopathy noted, cervical, inguinal. Neuro: Alert. Normal reflexes, muscle tone coordination. No cranial nerve deficit. Skin: Skin is warm and dry. Left lower extremity with chronic skin changes and some erythema  Psychiatric: Normal mood and affect. Behavior, judgment, thought content normal.   Lab Results  Component Value Date   WBC 7.5 08/20/2013   HGB 13.7 08/20/2013   HCT 39.9 08/20/2013   MCV 90.3 08/20/2013   PLT 161 08/20/2013   Lab Results  Component Value Date   CREATININE 0.52 08/21/2013   BUN 7 08/21/2013   NA 141 08/21/2013   K 3.6* 08/21/2013   CL 104 08/21/2013   CO2 27 08/21/2013    Lab Results  Component Value Date   HGBA1C 12.4* 08/16/2013   Lipid Panel     Component Value Date/Time   CHOL 205* 03/12/2010 2145   TRIG 207* 03/12/2010 2145   HDL 46 03/12/2010 2145   CHOLHDL 4.5 Ratio 03/12/2010 2145   VLDL 41* 03/12/2010 2145   LDLCALC 118* 03/12/2010 2145       Assessment and plan:   Patient Active Problem List   Diagnosis Date Noted  . Diabetes 09/08/2013    Priority: Medium - A1c in April 2015 was 12.4 indicating poor glycemic control. Patient will have another A1c checked in July 2015  - Continue current insulin regimen which is Lantus 15 units daily  - Needs podiatry and ophthalmology referral   . Cellulitis 08/15/2013    Priority: Medium - Needs to continue cefazolin but this medication is provided by home health nurse. I instructed the patient and his daughter to contact home health nurse in regards to antibiotic refills   . Essential hypertension, benign 07/31/2009    Priority: Medium - Continue lisinopril. Kidney function is within normal limits   . CONSTIPATION 07/31/2009    Priority: Medium - Continue MiraLAX

## 2013-09-08 NOTE — Addendum Note (Signed)
Addended by: Nonnie DoneSMITH, JILL D on: 09/08/2013 12:02 PM   Modules accepted: Orders, Medications

## 2013-09-08 NOTE — Patient Instructions (Signed)
Diabetes y enfermedad de los pequeos vasos (Diabetes and Small Vessel Disease) La enfermedad de los pequeos vasos (enfermedad microvascular) incluye la nefropata, la retinopata y la neuropata. Las personas que padecen diabetes tienen ms riesgo de sufrir estos problemas, pero podrn evitarse si se mantiene el nivel de glucosa en sangre bajo control. PROBLEMAS DIABTICOS EN LOS RIONES (NEFROPATA DIABTICA)  La nefropata diabtica ocurre en muchos pacientes diabticos.  La lesin de los pequeos vasos de los riones es la causa principal de la enfermedad renal en la etapa final.  Las protenas en la orina (albuminuria) en niveles entre 30 y 300 mg/24 h (microalbuminuria) es un signo de nefropata diabtica en sus primeras etapas.  Un nivel adecuado de glucosa en sangre y el control de la presin arterial reducen significativamente el progreso de la nefropata. PROBLEMAS EN LOS OJOS (RETINOPATA DIABTICA )   La retinopata diabtica es la causa ms comn de [employer]. Se relaciona con el nmero de aos que una persona ha sido diabtica.  Los factores ms comunes son el alto nivel de glucosa en sangre (hiperglucemia), presin arterial elevada (hipertensin), y falta de control de lpidos en sangre, como por ejemplo el colesterol en sangre elevado (hipercolesterolemia). PROBLEMAS EN LOS NERVIOS (NEUROPATA DIABTICA)  La neuropata diabtica es la causa ms frecuente de complicaciones crnicas de la diabetes. Es la causa de ms de la mitad de las amputaciones de las piernas que no son producidas por accidentes. El riesgo principal de Environmental education officer una neuropata diabtica parecen ser los niveles de glucosa sin Chief Operating Officer. La hiperglucemia lesiona las fibras nerviosas y causa problemas de sensibilidad. Cuanto ms estrictamente usted siga estas pautas, ms probabilidades tendr de [employer] relacionados con la enfermedad de los pequeos vasos.  Esfurcese por lograr un  nivel normal de glucosa en sangre, o tan normal como sea posible. Deber mantener el nivel de glucosa y el A 1c en el rango indicado por su mdico.  Mantenga la presin arterial en menos de 120/80.  Mantenga las lipoprotenas de baja densidad o LDL (una de las grasas presentes en el organismo) en menos de 100 mg/dL. En los pacientes de alto riesgo se recomienda una LDL de menos de 70 mg/dL . Como no se pueden modificar los antecedentes familiares, es importante modificar los factores de riesgo en la medida de lo posible. Entre los factores de riesgo que usted puede Chief Operating Officer se incluyen:  Control de la presin arterial.  Dejar de fumar.  Beba alcohol con moderacin. Generalmente esto significa un trago por da para las mujeres y [place] tragos para los hombres.  Controle los lpidos en sangre (colesterol y triglicridos).  Trate los problemas cardacos, si stos contribuyen al riesgo. SOLICITE ATENCIN MDICA SI:  Tiene problemas para Futures trader de glucosa en el rango indicado.  Nota cambios o un nuevo problema en la visin .  Tiene una herida o una llaga que no se cura.  Su nivel de presin arterial es ms que el indicado. Document Released: 04/21/2005 Document Revised: 04/07/2012 [facility] Patient Information 2014 [place], [place]. Control de la glucosa en sangre - Adultos  (Blood Glucose Monitoring, Adult) El control de la glucosa en sangre (tambin llamada azcar en sangre) lo ayudar a controlar su diabetes. Tambin ayuda a que usted y su mdico controlen la diabetes y determinen si el tratamiento est funcionando bien.  POR QU HAY QUE MEDIR LA GLUCOSA DE LA SANGRE?   Esto ayuda a comprender de [employer], la [place]  fsica y los medicamentos inciden en los niveles de [place].  Le permite conocer su nivel de glucosa en sangre, en cualquier momento. Usted puede saber rpidamente si tiene un nivel bajo (hipoglucemia) o un nivel elevado de glucosa en la sangre  (hiperglucemia).  Ayuda a que usted y su mdico sepan como ajustar los medicamentos.  Puede ayudarlo a comprender Public affairs consultant una enfermedad o a ajustar los medicamentos con respecto a la actividad fsica. CUNDO DEBE HACERSE EL ESTUDIO?  El mdico lo ayudar a decidir con qu frecuencia deber Information systems manager. Esto puede depender del tipo de diabetes que tenga, su control de la diabetes o los tipos de medicamentos que tome. Asegrese de anotar todos los valores de la glucosa en [place], de modo que esta informacin pueda ser revisada por su mdico. Vea ejemplos ms abajo de los momentos para Education officer, environmental la prueba que su mdico puede indicar.  Diabetes tipo 1:  Haga la prueba 4 veces por da si est bien controlado, usando una bomba de insulina o aplicando mltiples inyecciones diarias.  Si la diabetes no est bien controlada o si est enfermo, puede ser necesario que se controle con ms frecuencia.  Es una buena idea tambin controlarse:  Antes de Materials engineer.  Entre las comidas y 2 horas despus de las comidas.  Ocasionalmente, entre las 2:00 y 3:00 am. Diabetes tipo 2  Puede variar con cada persona, pero en general, si recibe insulina, hgase la prueba 4 veces por da.  Si toma medicamentos por boca (va oral), hgase la prueba 2 veces por da.  Si sigue una dieta controlada, hgase la prueba una vez por da.  Si la diabetes no est bien controlada o si est enfermo, puede ser necesario que se controle con ms frecuencia. CMO CONTROLAR EL NIVEL DE GLUCOSA EN SANGRE  Materiales necesarios  Un medidor de Event organiser.  Tiras reactivas para el medidor. Cada medidor tiene sus propias tiras reactivas. Debe usar las tiras reactivas correspondientes a su medidor.  Una aguja para pinchar (lanceta).  Un dispositivo que sostenga la lanceta (dispositivo de puncin).  Un diario o libro de anotaciones para [employer]. Procedimiento  Lave sus  manos con agua y [place]. No se recomienda usar alcohol.  Pinche un lado de su dedo (no la punta) con Optometrist.  Apriete suavemente el dedo hasta que aparezca una pequea gota de [place].  Siga las instrucciones que vienen con el medidor para Public affairs consultant tira Firefighter, para Contractor la sangre sobre la tira y para el uso del medidor de Event organiser. Otras zonas de las que puede tomar sangre Algunos medidores le permiten tomar sangre para la prueba en otras zonas del cuerpo (que no son el dedo). Estas reas se llaman sitios alternativos. Los sitios alternativos ms habituales son:   El antebrazo.  El muslo.  La zona posterior de la pierna.  La palma de la mano. El flujo de sangre en estas zonas es ms lento. Por lo tanto, los valores de [address] en sangre que obtenga pueden estar demorados, y los nmeros son diferentes de los que obtiene de los dedos. Nosaque sangre en sitios alternativos si cree que tiene hipoglucemia. Los valores no sern precisos. Siempre extraiga del dedo si tiene hipoglucemia. Adems, si no puede darse cuenta cuando est con un nivel bajo (hipoglucemia asintomtica), siempre use los dedos para realizar la prueba de Event organiser.  CONSEJOS ADICIONALES PARA EL CONTROL DE LA GLUCOSA   No  vuelva a Lear Corporationutilizar las lancetas.  Siempre tenga los elementos a mano.  Todos los medidores de glucosa tienen un nmero de telfono "directo" al que podr HCA Incllamar las 24 horas si tiene dudas o French Southern Territoriesnecesita ayuda.  Ajuste (calibre) el medidor de glucosa con una solucin de control despus de finalizar algunas cajas de tiras reactivas. LLEVE REGISTROS DE LA GLUCOSA EN SANGRE  Es una buena idea llevar un diario o registro de los valores de glucosa en Grayridgesangre. La Harley-Davidsonmayora de los medidores de glucosa, sino todos, conservan el registro de la glucosa en el dispositivo. Algunos medidores permiten descargar los registros a su computadora. Llevar un registro de los valores de glucosa en sangre es  especialmente til si desea observar los patrones. Haga anotaciones simultneas con la Microbiologistlectura de los valores de glucosa en sangre debido a que podra olvidar lo que ocurri en el momento exacto. Llevar un buen registro lo ayudar a usted y a su mdico a trabajar juntos para Personnel officerlograr un buen control de la diabetes.  Document Released: 04/21/2005 Document Revised: 12/22/2012 [facility] Patient Information 2014 [place], [place]. Neuropata diabtica (Diabetic Neuropathy) La neuropata diabtica es una enfermedad o lesin nerviosa causada por la diabetes mellitus. Alrededor de la mitad de todas las personas que sufren diabetes mellitus tienen alguna forma de lesin nerviosa. Es ms frecuente en aquellas personas que han sufrido diabetes mellitus durante muchos aos y quienes generalmente no tienen buen control del nivel de International aid/development workerazcar en la sangre (glucosa). La neuropata diabtica es una complicacin comn de la diabetes mellitus. Hay tres tipos ms comunes de neuropata diiabtica y un cuarto tipo que es menos frecuente y menos comprendido:   Neuropata perifrica: Es el tipo ms frecuente de neuropata diabtica. Causa lesiones en los nervios de los pies y las piernas primero y finalmente en las manos y [place]lifflos brazos.La lesin afecta la capacidad de sentir con el tacto.  Neuropata autonmica: este tipo causa lesiones en el sistema nervioso autnomo, que controla las siguientes funciones:  Los latidos cardacos.  La temperatura corporal.  La presin arterial.  La miccin.  La digestin.  La sudoracin.  La funcin sexual.  Neuropata focal: la neuropata focal puede ser dolorosa e impredecible y ocurre con ms frecuencia en adultos mayores con diabetes mellitus. Implica un nervio especfico o un rea y con frecuencia aparece repentinamente. Generalmente no causa problemas a Air cabin crewlargo plazo.  Neuropata diabtica proximal: tambin llamada neuropata lumbosacra, afecta los nervios de los muslos, las  caderas, las nalgas o las piernas. Es ms frecuente en personas con diabetes mellitus tipo 2 y en hombre mayores. Se caracteriza por dolor debilitante, debilidad y atrofia, generalmente en los msculos de los muslos. CAUSAS  La causa de la neuropata perifrica, autonmica y focal es la diabetes mellitus no controlada y los niveles elevados de glucosa. La causa de la neuropata diabtica proximal no se conoce. Sin embargo, se considera que la causa es una inflamacin relacionada con los niveles no controlados de glucosa. SIGNOS Y SNTOMAS  Neuropata perifrica La neuropata perifrica se desarrolla lentamente a lo largo del tiempo. Cuando los nervios de los pies y las piernas ya no funcionan puede haber:   Stage managerArdor, sensacin pulstil o dolor en las piernas o los pies.  Incapacidad para sentir presin o dolor en el pie. Las consecuencias son:  Callosidades gruesas en las zonas de presin.  Escaras.  lceras.  Deformidades en el pie.  Capacidad reducida para sentir los cambios de [address]temperatura.  Debilidad muscular. Neuropata autnoma Los sntomas varan  segn qu nervios estn afectados. Los sntomas pueden ser:  Problemas digestivos como:  Ganas de vomitar (nuseas).  Vmitos.  Hinchazn.  Estreimiento.  Diarrea.  Dolor abdominal.  Dificultad para orinar. Esto ocurre cuando se pierde la capacidad de sentir cuando la vejiga est llena. Los problemas incluyen:  Prdida de orina (incontinencia).  Imposibilidad de vaciar la vejiga completamente (retencin).  Latidos cardacos rpidos o irregulares (palpitaciones).  Descenso brusco de la presin arterial al ponerse de pie (hipotensin ortosttica). Al ponerse de pie puede sentir:  [employer].  Debilidad.  Desmayos.  En los hombres, imposibilidad de Personnel officer y Designer, fashion/clothing.  En las mujeres, sequedad vaginal y problemas de disminucin del deseo sexual y Barrister's clerk.  Problemas con la regulacin de la Regulatory affairs officer.  Aumento o disminucin de la sudoracin. Neuropata focal  Movimientos oculares anormales o alineacin ocular anormal de ambos ojos.  Debilidad en la mueca.  Pie cado. Esto da como resultado una incapacidad para levantar el pie adecuadamente y Garald Braver o movimientos del pie anormales.  Parlisis de un lado del rostro (parlisis de [place]).  Dolor en el pecho o dolor abdominal. Neuropata radicular plexual  Dolor repentino e intenso en la cadera, el muslo o las nalgas.  Debilidad y desgaste de los msculos del muslo.  Dificultad para levantarse desde una posicin de sentado.  Hinchazn abdominal.  Prdida de peso sin motivo (generalmente ms de 10 lb [4,5 kg]). DIAGNSTICO  Neuropata perifrica Le controlarn los sentidos. Las pruebas de funcin sensorial pueden realizarse con:  Un ligero toque usando un monofilamento.  La vibracin con un diapasn.  Sensacin aguda con el pinchazo de Hydrographic surveyor. Otras pruebas que ayudan a diagnosticar la neuropata son:  Velocidad de conduccin nerviosa. Este estudio controla la transmisin de la corriente Radio producer a travs del nervio.  Electromiograma. Muestra de qu modo los msculos responden a las seales elctricas transmitidas por los nervios que los rodean.  Prueba de sensibilidad cuantitativa. Se utiliza para evaluar de que Sara Lee nervios responden a las vibraciones y cambios de Marketing executive. Neuropata autnoma El diagnstico se basa en los sntomas informados. infrmele al mdico o al profesional de la salud si siente:   [employer].   Estreimiento.   Diarrea.   Dificultad o imposibilidad de Geographical information systems officer.   Imposibilidad de lograr o Designer, fashion/clothing.  Podrn solicitarle otros estudios, por ejemplo:   Electrocardiograma o monitoreo Holter. Estos estudios muestran si hay problemas con la frecuencia y el ritmo cardacos.   Le harn radiografas. Neuropata focal El diagnstico se basa en los sntomas y  en lo que el mdico encuentre durante el examen. Podrn solicitarle otros estudios. Pueden incluir:  Velocidad de conduccin nerviosa. Este estudio controla la transmisin de la corriente Radio producer a travs del nervio.  Electromiograma. Muestra de qu modo los msculos responden a las seales elctricas transmitidas por los nervios que los rodean.  Prueba de sensibilidad cuantitativa. Se utiliza para evaluar de que Sara Lee nervios responden a las vibraciones y cambios de Marketing executive. Neuropataradicular plexual  Generalmente lo primero es Sport and exercise psychologist otro motivo o problemas que pudieran ser la causa, ya que no hay exmenes para realizar este diagnstico.  Radiografa de la columna vertebral y la regin lumbar.  Puncin lumbar para descartar cncer.  Resonancia magntica para Teacher, early years/pre lesiones. TRATAMIENTO  Una vez que se produce una lesin nerviosa, no puede ser revertida. El objetivo del tratamiento es impedir que la enfermedad o la lesin nerviosa empeoren y afecten ms nervios. Controlar  el nivel de glucosa en la sangre es la clave. La mayora de las personas con neuropata radicular plexual ven al menos una mejora parcial con el tiempo. Deber mantener su nivel de glucosa en sangre y el nivel HbA1c en los valores determinados por su mdico. Los facHarley-Davidsontores que ayudan a Chief Operating Officercontrolar el nivel de glucosa en sangre son:   Control del nivel de Event organiserglucosa en sangre.   Planificacin de los alimentos.   Actividad fsica.   Medicamentos para la diabetes.  Con el tiempo, el mantener Molson Coors Brewingbajos los niveles de glucosa en la sangre ayuda a Honeywelldisminuir los sntomas. En algunos casos, se indican analgsicos. INSTRUCCIONES PARA EL CUIDADO EN EL HOGAR:  No fume.  Mantenga su nivel de glucosa en sangre en el rango en el que usted y su mdico han determinado que es aceptable para usted.  Mantenga su nivel de glucosa en la sangre en el rango en el que usted y su mdico han determinado que es  aceptable para usted.  Consuma una dieta bien balanceada.  Sea fsicamente Cox Communicationsactivo todos los das.  Revise sus pies todos los [place]par[place]odas. SOLICITE ATENCIN MDICA SI:   Siente ardor, sensacin pulstil o dolor en las piernas o los pies.  Tiene incapacidad para sentir presin o dolor en el pie.  Aparecen problemas digestivos como:  Nuseas.  Vmitos.  Hinchazn.  Estreimiento.  Diarrea.  Dolor abdominal.  Tiene dificultad para orinar como:  incontinencia.  Retencin.  Usted tiene palpitaciones.  Presenta hipotensin ortosttica. Al ponerse de pie puede sentir:  Cox CommunicationsMareos.  Debilidad.  Desmayarse  No puede lograr tener ni mantener una ereccin (en los hombres).  En las mujeres, sequedad vaginal y problemas de disminucin del deseo sexual y Barrister's clerkla excitacin.  Siente un dolor International Business Machinesintenso en los muslos, las piernas o las nalgas.  Pierde peso de Zumbrotamanera inexplicable. Document Released: 04/21/2005 Document Revised: 12/22/2012 [facility] Patient Information 2014 [place]inesExitCare, [place].

## 2013-09-13 DIAGNOSIS — Z794 Long term (current) use of insulin: Secondary | ICD-10-CM | POA: Diagnosis not present

## 2013-09-13 DIAGNOSIS — T847XXA Infection and inflammatory reaction due to other internal orthopedic prosthetic devices, implants and grafts, initial encounter: Secondary | ICD-10-CM | POA: Diagnosis not present

## 2013-09-13 DIAGNOSIS — Z452 Encounter for adjustment and management of vascular access device: Secondary | ICD-10-CM | POA: Diagnosis not present

## 2013-09-13 DIAGNOSIS — I10 Essential (primary) hypertension: Secondary | ICD-10-CM | POA: Diagnosis not present

## 2013-09-13 DIAGNOSIS — Z96698 Presence of other orthopedic joint implants: Secondary | ICD-10-CM | POA: Diagnosis not present

## 2013-09-13 DIAGNOSIS — IMO0001 Reserved for inherently not codable concepts without codable children: Secondary | ICD-10-CM | POA: Diagnosis not present

## 2013-09-16 DIAGNOSIS — IMO0001 Reserved for inherently not codable concepts without codable children: Secondary | ICD-10-CM | POA: Diagnosis not present

## 2013-09-16 DIAGNOSIS — I10 Essential (primary) hypertension: Secondary | ICD-10-CM | POA: Diagnosis not present

## 2013-09-16 DIAGNOSIS — Z96698 Presence of other orthopedic joint implants: Secondary | ICD-10-CM | POA: Diagnosis not present

## 2013-09-16 DIAGNOSIS — T847XXA Infection and inflammatory reaction due to other internal orthopedic prosthetic devices, implants and grafts, initial encounter: Secondary | ICD-10-CM | POA: Diagnosis not present

## 2013-09-16 DIAGNOSIS — Z794 Long term (current) use of insulin: Secondary | ICD-10-CM | POA: Diagnosis not present

## 2013-09-16 DIAGNOSIS — Z452 Encounter for adjustment and management of vascular access device: Secondary | ICD-10-CM | POA: Diagnosis not present

## 2013-09-19 ENCOUNTER — Telehealth: Payer: Self-pay | Admitting: *Deleted

## 2013-09-19 ENCOUNTER — Encounter: Payer: Self-pay | Admitting: Infectious Disease

## 2013-09-19 ENCOUNTER — Ambulatory Visit (INDEPENDENT_AMBULATORY_CARE_PROVIDER_SITE_OTHER): Payer: Medicare Other | Admitting: Infectious Disease

## 2013-09-19 VITALS — BP 123/78 | HR 65 | Temp 98.2°F | Wt 220.0 lb

## 2013-09-19 DIAGNOSIS — M869 Osteomyelitis, unspecified: Secondary | ICD-10-CM | POA: Diagnosis not present

## 2013-09-19 DIAGNOSIS — A4901 Methicillin susceptible Staphylococcus aureus infection, unspecified site: Secondary | ICD-10-CM

## 2013-09-19 DIAGNOSIS — T847XXA Infection and inflammatory reaction due to other internal orthopedic prosthetic devices, implants and grafts, initial encounter: Secondary | ICD-10-CM

## 2013-09-19 DIAGNOSIS — M86169 Other acute osteomyelitis, unspecified tibia and fibula: Secondary | ICD-10-CM | POA: Diagnosis not present

## 2013-09-19 MED ORDER — CEPHALEXIN 500 MG PO CAPS: 500.0000 mg | ORAL_CAPSULE | Freq: Four times a day (QID) | ORAL | Status: DC

## 2013-09-19 NOTE — Telephone Encounter (Signed)
Verbal order per Dr. Daiva EvesVan Dam given to [facility] to extend IV antibiotics through 09/29/13 and to draw a sed rate and CRP with next lab draw. Wendall MolaJacqueline Cockerham

## 2013-09-19 NOTE — Patient Instructions (Signed)
Quando se termine los antibioticos IV 2 grams cefazolin tres veces por dia May 27th, 2015   comenzar los antibioticos para la boca (keflex 500mg  quatros veces por dia)

## 2013-09-19 NOTE — Progress Notes (Signed)
   Subjective:    Patient ID: Louis Peterson  Louis Peterson, male    DOB: 09/01/1945, 68 y.o.   MRN: [id number]  HPI  [age] year Hispanic [place] 5 years ago. He develped pain and redness drainage surrounding erythema and was admitted to [facility].  He was seen by Dr. Eulah PontMurphy who took to the OR for I and D deep cultures and removal of infected tibial nail.   He found loose hardware and pus tracking to bone and hardware. Cultures yielded MSSA. The patient was narrowed to IV ancef 2g IV q 8hours and is near 6 weeks of IV abx. He does not speak [place] but his daughter who accompanied him provided large amount of translation.   Review of Systems  Constitutional: Negative for chills, diaphoresis and fatigue.  Cardiovascular: Positive for leg swelling.  Gastrointestinal: Negative for nausea and vomiting.  Musculoskeletal: Positive for arthralgias.  Skin: Positive for color change and wound.  Psychiatric/Behavioral: Negative for confusion.       Objective:   Physical Exam  Constitutional: He is oriented to person, place, and time. He appears well-developed and well-nourished.  HENT:  Head: Normocephalic and atraumatic.  Mouth/Throat: Oropharynx is clear and moist.  Eyes: Conjunctivae and EOM are normal. No scleral icterus.  Neck: Normal range of motion. Neck supple.  Cardiovascular: Normal rate and regular rhythm.   No murmur heard. Pulmonary/Chest: Effort normal. No respiratory distress. He has no wheezes. He has no rales.  Abdominal: He exhibits no distension. There is no guarding.  Musculoskeletal: He exhibits no edema and no tenderness.       Legs: Neurological: He is alert and oriented to person, place, and time. He exhibits normal muscle tone. Coordination normal.  Skin: Skin is warm and dry. He is not diaphoretic. No erythema. No pallor.  Psychiatric: He has a normal mood and affect. His behavior is normal. Judgment and thought content normal.     Pictures LLE: Proximal site:     Distal surgical site, with chronic venous stasis changes     PICC clean:        Assessment & Plan:   #1 MSSA osteomyelitis, hardware associated sp I and D and removal of hardware:  --finish 6 weeks of IV ancef and then start high dose oral keflex 500mg  po QID for another month --rtc to see me in one month time  I spent greater than 25 minutes with the patient including greater than 50% of time in face to face counsel of the patient and in coordination of their care.

## 2013-09-19 NOTE — Telephone Encounter (Signed)
Verbal order given to [facility] to pull picc on 09/29/13

## 2013-09-20 ENCOUNTER — Telehealth: Payer: Self-pay | Admitting: *Deleted

## 2013-09-20 DIAGNOSIS — Z96698 Presence of other orthopedic joint implants: Secondary | ICD-10-CM | POA: Diagnosis not present

## 2013-09-20 DIAGNOSIS — Z794 Long term (current) use of insulin: Secondary | ICD-10-CM | POA: Diagnosis not present

## 2013-09-20 DIAGNOSIS — T847XXA Infection and inflammatory reaction due to other internal orthopedic prosthetic devices, implants and grafts, initial encounter: Secondary | ICD-10-CM | POA: Diagnosis not present

## 2013-09-20 DIAGNOSIS — I10 Essential (primary) hypertension: Secondary | ICD-10-CM | POA: Diagnosis not present

## 2013-09-20 DIAGNOSIS — Z452 Encounter for adjustment and management of vascular access device: Secondary | ICD-10-CM | POA: Diagnosis not present

## 2013-09-20 DIAGNOSIS — IMO0001 Reserved for inherently not codable concepts without codable children: Secondary | ICD-10-CM | POA: Diagnosis not present

## 2013-09-20 NOTE — Telephone Encounter (Signed)
Called patient's daughter and left a voice mail regarding his lab results. She wanted to know what the labs from [facility] showed in relation to his infection. Per Dr. Daiva EvesVan Dam his sed rate and crp have showed improvement. Louis Peterson

## 2013-09-27 DIAGNOSIS — Z452 Encounter for adjustment and management of vascular access device: Secondary | ICD-10-CM | POA: Diagnosis not present

## 2013-09-27 DIAGNOSIS — T847XXA Infection and inflammatory reaction due to other internal orthopedic prosthetic devices, implants and grafts, initial encounter: Secondary | ICD-10-CM | POA: Diagnosis not present

## 2013-09-27 DIAGNOSIS — Z96698 Presence of other orthopedic joint implants: Secondary | ICD-10-CM | POA: Diagnosis not present

## 2013-09-27 DIAGNOSIS — IMO0001 Reserved for inherently not codable concepts without codable children: Secondary | ICD-10-CM | POA: Diagnosis not present

## 2013-09-27 DIAGNOSIS — Z794 Long term (current) use of insulin: Secondary | ICD-10-CM | POA: Diagnosis not present

## 2013-09-27 DIAGNOSIS — I10 Essential (primary) hypertension: Secondary | ICD-10-CM | POA: Diagnosis not present

## 2013-10-03 DIAGNOSIS — T847XXA Infection and inflammatory reaction due to other internal orthopedic prosthetic devices, implants and grafts, initial encounter: Secondary | ICD-10-CM | POA: Diagnosis not present

## 2013-10-03 DIAGNOSIS — IMO0001 Reserved for inherently not codable concepts without codable children: Secondary | ICD-10-CM | POA: Diagnosis not present

## 2013-10-03 DIAGNOSIS — Z794 Long term (current) use of insulin: Secondary | ICD-10-CM | POA: Diagnosis not present

## 2013-10-03 DIAGNOSIS — I10 Essential (primary) hypertension: Secondary | ICD-10-CM | POA: Diagnosis not present

## 2013-10-03 DIAGNOSIS — Z96698 Presence of other orthopedic joint implants: Secondary | ICD-10-CM | POA: Diagnosis not present

## 2013-10-03 DIAGNOSIS — Z452 Encounter for adjustment and management of vascular access device: Secondary | ICD-10-CM | POA: Diagnosis not present

## 2013-10-04 LAB — AFB CULTURE WITH SMEAR (NOT AT ARMC): ACID FAST SMEAR: NONE SEEN

## 2013-10-26 ENCOUNTER — Ambulatory Visit: Payer: Medicare Other | Admitting: Infectious Disease

## 2013-11-01 ENCOUNTER — Ambulatory Visit (INDEPENDENT_AMBULATORY_CARE_PROVIDER_SITE_OTHER): Payer: Medicare Other | Admitting: Infectious Disease

## 2013-11-01 ENCOUNTER — Encounter: Payer: Self-pay | Admitting: Infectious Disease

## 2013-11-01 ENCOUNTER — Other Ambulatory Visit (HOSPITAL_COMMUNITY)
Admission: RE | Admit: 2013-11-01 | Discharge: 2013-11-01 | Disposition: A | Discharge status: Home / Self Care | Payer: Medicare Other | Source: Ambulatory Visit | Attending: Infectious Disease | Admitting: Infectious Disease

## 2013-11-01 VITALS — BP 128/79 | HR 59 | Temp 97.8°F | Ht 67.0 in | Wt 217.0 lb

## 2013-11-01 DIAGNOSIS — R351 Nocturia: Secondary | ICD-10-CM | POA: Diagnosis not present

## 2013-11-01 DIAGNOSIS — T847XXA Infection and inflammatory reaction due to other internal orthopedic prosthetic devices, implants and grafts, initial encounter: Secondary | ICD-10-CM | POA: Diagnosis not present

## 2013-11-01 DIAGNOSIS — E1159 Type 2 diabetes mellitus with other circulatory complications: Secondary | ICD-10-CM

## 2013-11-01 DIAGNOSIS — R300 Dysuria: Secondary | ICD-10-CM | POA: Diagnosis not present

## 2013-11-01 DIAGNOSIS — Z113 Encounter for screening for infections with a predominantly sexual mode of transmission: Secondary | ICD-10-CM | POA: Insufficient documentation

## 2013-11-01 DIAGNOSIS — A4901 Methicillin susceptible Staphylococcus aureus infection, unspecified site: Secondary | ICD-10-CM | POA: Diagnosis not present

## 2013-11-01 DIAGNOSIS — M86169 Other acute osteomyelitis, unspecified tibia and fibula: Secondary | ICD-10-CM | POA: Diagnosis not present

## 2013-11-01 DIAGNOSIS — I872 Venous insufficiency (chronic) (peripheral): Secondary | ICD-10-CM

## 2013-11-01 DIAGNOSIS — B351 Tinea unguium: Secondary | ICD-10-CM | POA: Diagnosis not present

## 2013-11-01 DIAGNOSIS — Z5189 Encounter for other specified aftercare: Secondary | ICD-10-CM

## 2013-11-01 DIAGNOSIS — M869 Osteomyelitis, unspecified: Secondary | ICD-10-CM | POA: Diagnosis not present

## 2013-11-01 DIAGNOSIS — T847XXD Infection and inflammatory reaction due to other internal orthopedic prosthetic devices, implants and grafts, subsequent encounter: Secondary | ICD-10-CM

## 2013-11-01 DIAGNOSIS — I878 Other specified disorders of veins: Secondary | ICD-10-CM

## 2013-11-01 LAB — C-REACTIVE PROTEIN

## 2013-11-01 LAB — URINALYSIS, ROUTINE W REFLEX MICROSCOPIC
Bilirubin Urine: NEGATIVE
GLUCOSE, UA: NEGATIVE mg/dL
Hgb urine dipstick: NEGATIVE
Ketones, ur: NEGATIVE mg/dL
Leukocytes, UA: NEGATIVE
Nitrite: NEGATIVE
Protein, ur: NEGATIVE mg/dL
Specific Gravity, Urine: 1.019 (ref 1.005–1.030)
Urobilinogen, UA: 0.2 mg/dL (ref 0.0–1.0)
pH: 7 (ref 5.0–8.0)

## 2013-11-01 LAB — SEDIMENTATION RATE: Sed Rate: 11 mm/hr (ref 0–16)

## 2013-11-01 MED ORDER — TERBINAFINE HCL 250 MG PO TABS: 250.0000 mg | ORAL_TABLET | Freq: Every day | ORAL | Status: DC
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
## 2013-11-01 NOTE — Patient Instructions (Signed)
(  [phone] [id number] [facility]

## 2013-11-02 ENCOUNTER — Other Ambulatory Visit: Payer: Self-pay | Admitting: Internal Medicine

## 2013-11-02 LAB — URINE CULTURE
Colony Count: NO GROWTH
Organism ID, Bacteria: NO GROWTH

## 2013-11-02 LAB — PSA: PSA: 6.55 ng/mL — ABNORMAL HIGH (ref <=4.00)

## 2013-11-03 ENCOUNTER — Other Ambulatory Visit: Payer: Self-pay | Admitting: *Deleted

## 2013-11-03 ENCOUNTER — Telehealth: Payer: Self-pay | Admitting: *Deleted

## 2013-11-03 DIAGNOSIS — N419 Inflammatory disease of prostate, unspecified: Secondary | ICD-10-CM

## 2013-11-03 MED ORDER — SULFAMETHOXAZOLE-TMP DS 800-160 MG PO TABS: 1.0000 | ORAL_TABLET | Freq: Two times a day (BID) | ORAL | Status: DC

## 2013-11-03 NOTE — Telephone Encounter (Signed)
Patient notified through his daughter in law. [facility] and per Dr. Daiva EvesVan Dam he should keep his scheduled appointment on 02/01/14. Louis MolaJacqueline Hanan Mcwilliams

## 2013-11-03 NOTE — Telephone Encounter (Signed)
Message copied by Macy MisOCKERHAM, Diogenes Whirley A on Thu Nov 03, 2013 11:55 AM ------      Message from: VAN DAM, CORNELIUS N      Created: Wed Nov 02, 2013  4:49 PM       I would like Louis Peterson to take one DS bactrim twice daily for next 2 months to treat him for prostatitis. I would reschedule him with me in about 5-6 weeks ------

## 2013-11-25 ENCOUNTER — Encounter: Payer: Self-pay | Admitting: Internal Medicine

## 2013-12-09 ENCOUNTER — Ambulatory Visit: Payer: Medicare Other | Attending: Internal Medicine | Admitting: Internal Medicine

## 2013-12-09 ENCOUNTER — Encounter: Payer: Self-pay | Admitting: Internal Medicine

## 2013-12-09 VITALS — BP 128/81 | HR 60 | Temp 98.8°F | Resp 16 | Ht 65.0 in | Wt 216.0 lb

## 2013-12-09 DIAGNOSIS — E139 Other specified diabetes mellitus without complications: Secondary | ICD-10-CM | POA: Insufficient documentation

## 2013-12-09 DIAGNOSIS — I10 Essential (primary) hypertension: Secondary | ICD-10-CM | POA: Diagnosis not present

## 2013-12-09 DIAGNOSIS — E089 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: Secondary | ICD-10-CM

## 2013-12-09 DIAGNOSIS — Z794 Long term (current) use of insulin: Secondary | ICD-10-CM | POA: Insufficient documentation

## 2013-12-09 LAB — POCT GLYCOSYLATED HEMOGLOBIN (HGB A1C): HEMOGLOBIN A1C: 5.8

## 2013-12-09 LAB — GLUCOSE, POCT (MANUAL RESULT ENTRY): POC GLUCOSE: 91 mg/dL (ref 70–99)
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
## 2013-12-09 NOTE — Patient Instructions (Signed)
La diabetes mellitus y los alimentos (Diabetes Mellitus and Food) Es importante que controle su nivel de azcar en la sangre (glucosa). El nivel de glucosa en sangre depende en gran medida de lo que usted come. Comer alimentos saludables en las cantidades adecuadas a lo largo del da, aproximadamente a la misma hora todos los das, lo ayudar a controlar su nivel de glucosa en sangre. Tambin puede ayudarlo a retrasar o evitar el empeoramiento de la diabetes mellitus. Comer de manera saludable incluso puede ayudarlo a mejorar el nivel de presin arterial y a alcanzar o mantener un peso saludable.  CMO PUEDEN AFECTARME LOS ALIMENTOS? Carbohidratos Los carbohidratos afectan el nivel de glucosa en sangre ms que cualquier otro tipo de alimento. El nutricionista lo ayudar a determinar cuntos carbohidratos puede consumir en cada comida y ensearle a contarlos. El recuento de carbohidratos es importante para mantener la glucosa en sangre en un nivel saludable, en especial si utiliza insulina o toma determinados medicamentos para la diabetes mellitus. Alcohol El alcohol puede provocar disminuciones sbitas de la glucosa en sangre (hipoglucemia), en especial si utiliza insulina o toma determinados medicamentos para la diabetes mellitus. La hipoglucemia es una afeccin que puede poner en peligro la vida. Los sntomas de la hipoglucemia (somnolencia, mareos y desorientacin) son similares a los sntomas de haber consumido mucho alcohol.  Si el mdico lo autoriza a beber alcohol, hgalo con moderacin y siga estas pautas:  Las mujeres no deben beber ms de un trago por da, y los hombres no deben beber ms de dos tragos por da. Un trago es igual a:  12 onzas (355 ml) de cerveza  5 onzas de vino (150 ml) de vino  1,5onzas (45ml) de bebidas espirituosas  No beba con el estmago vaco.  Mantngase hidratado. Beba agua, gaseosas dietticas o t helado sin azcar.  Las gaseosas comunes, los jugos y  otros refrescos podran contener muchos carbohidratos y se deben contar. QU ALIMENTOS NO SE RECOMIENDAN? Cuando haga las elecciones de alimentos, es importante que recuerde que todos los alimentos son distintos. Algunos tienen menos nutrientes que otros por porcin, aunque podran tener la misma cantidad de caloras o carbohidratos. Es difcil darle al cuerpo lo que necesita cuando consume alimentos con menos nutrientes. Estos son algunos ejemplos de alimentos que debera evitar ya que contienen muchas caloras y carbohidratos, pero pocos nutrientes:  Grasas trans (la mayora de los alimentos procesados incluyen grasas trans en la etiqueta de Informacin nutricional).  Gaseosas comunes.  Jugos.  Caramelos.  Dulces, como tortas, pasteles, rosquillas y galletas.  Comidas fritas. QU ALIMENTOS PUEDO COMER? Consuma alimentos ricos en nutrientes, que nutrirn el cuerpo y lo mantendrn saludable. Los alimentos que debe comer tambin dependern de varios factores, como:  Las caloras que necesita.  Los medicamentos que toma.  Su peso.  El nivel de glucosa en sangre.  El nivel de presin arterial.  El nivel de colesterol. Tambin debe consumir una variedad de alimentos, como:  Protenas, como carne, aves, pescado, tofu, frutos secos y semillas (las protenas de animales magros son mejores).  Frutas.  Verduras.  Productos lcteos, como leche, queso y yogur (descremados son mejores).  Panes, granos, pastas, cereales, arroz y frijoles.  Grasas, como aceite de oliva, margarina sin grasas trans, aceite de canola, aguacate y aceitunas. TODOS LOS QUE PADECEN DIABETES MELLITUS TIENEN EL MISMO PLAN DE COMIDAS? Dado que todas las personas que padecen diabetes mellitus son distintas, no hay un solo plan de comidas que funcione para todos. Es muy   importante que se rena con un nutricionista que lo ayudar a crear un plan de comidas adecuado para usted. Document Released: 07/29/2007  Document Revised: 04/26/2013 [employer] Patient Information 2015 [employer]. This information is not intended to replace advice given to you by your health care provider. Make sure you discuss any questions you have with your health care provider.  

## 2013-12-09 NOTE — Progress Notes (Signed)
Patient here to follow up on diabetes and HTN.

## 2014-01-30 ENCOUNTER — Other Ambulatory Visit: Payer: Medicare Other

## 2014-02-01 ENCOUNTER — Ambulatory Visit: Payer: Medicare Other | Admitting: Infectious Disease

## 2014-02-08 ENCOUNTER — Encounter: Payer: Self-pay | Admitting: Infectious Disease

## 2014-02-08 ENCOUNTER — Ambulatory Visit (INDEPENDENT_AMBULATORY_CARE_PROVIDER_SITE_OTHER): Payer: Medicare Other | Admitting: Infectious Disease

## 2014-02-08 VITALS — BP 131/84 | HR 80 | Temp 98.3°F | Wt 221.0 lb

## 2014-02-08 DIAGNOSIS — N400 Enlarged prostate without lower urinary tract symptoms: Secondary | ICD-10-CM

## 2014-02-08 DIAGNOSIS — M869 Osteomyelitis, unspecified: Secondary | ICD-10-CM | POA: Diagnosis not present

## 2014-02-08 DIAGNOSIS — Z23 Encounter for immunization: Secondary | ICD-10-CM

## 2014-02-08 DIAGNOSIS — B351 Tinea unguium: Secondary | ICD-10-CM | POA: Diagnosis not present

## 2014-02-08 LAB — C-REACTIVE PROTEIN: CRP: 0.6 mg/dL — ABNORMAL HIGH (ref <0.60)

## 2014-02-08 MED ORDER — TAMSULOSIN HCL 0.4 MG PO CAPS: 0.4000 mg | ORAL_CAPSULE | Freq: Every day | ORAL | Status: DC

## 2014-02-08 MED ORDER — TERBINAFINE HCL 250 MG PO TABS: 250.0000 mg | ORAL_TABLET | Freq: Every day | ORAL | Status: DC
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
## 2014-02-09 LAB — SEDIMENTATION RATE: SED RATE: 16 mm/h (ref 0–16)

## 2014-02-23 ENCOUNTER — Ambulatory Visit: Payer: Medicare Other | Attending: Internal Medicine | Admitting: Internal Medicine

## 2014-02-23 ENCOUNTER — Encounter: Payer: Self-pay | Admitting: Internal Medicine

## 2014-02-23 VITALS — BP 163/81 | HR 95 | Temp 98.1°F | Resp 20 | Ht 65.0 in | Wt 226.0 lb

## 2014-02-23 DIAGNOSIS — Z794 Long term (current) use of insulin: Secondary | ICD-10-CM | POA: Insufficient documentation

## 2014-02-23 DIAGNOSIS — E119 Type 2 diabetes mellitus without complications: Secondary | ICD-10-CM

## 2014-02-23 DIAGNOSIS — B351 Tinea unguium: Secondary | ICD-10-CM

## 2014-02-23 DIAGNOSIS — N400 Enlarged prostate without lower urinary tract symptoms: Secondary | ICD-10-CM | POA: Diagnosis not present

## 2014-02-23 DIAGNOSIS — I10 Essential (primary) hypertension: Secondary | ICD-10-CM | POA: Diagnosis not present

## 2014-02-23 DIAGNOSIS — M7989 Other specified soft tissue disorders: Secondary | ICD-10-CM

## 2014-02-23 LAB — LIPID PANEL
Cholesterol: 186 mg/dL (ref 0–200)
HDL: 51 mg/dL (ref >39)
LDL Cholesterol: 92 mg/dL (ref 0–99)
Total CHOL/HDL Ratio: 3.6 Ratio
Triglycerides: 217 mg/dL — ABNORMAL HIGH (ref <150)
VLDL: 43 mg/dL — AB (ref 0–40)

## 2014-02-23 LAB — COMPLETE METABOLIC PANEL WITH GFR
ALBUMIN: 4 g/dL (ref 3.5–5.2)
ALT: 16 U/L (ref 0–53)
AST: 18 U/L (ref 0–37)
Alkaline Phosphatase: 125 U/L — ABNORMAL HIGH (ref 39–117)
BUN: 9 mg/dL (ref 6–23)
CO2: 24 mEq/L (ref 19–32)
Calcium: 9.1 mg/dL (ref 8.4–10.5)
Chloride: 104 mEq/L (ref 96–112)
Creat: 0.84 mg/dL (ref 0.50–1.35)
GFR, Est African American: >89 mL/min
GLUCOSE: 112 mg/dL — AB (ref 70–99)
POTASSIUM: 4.4 meq/L (ref 3.5–5.3)
SODIUM: 142 meq/L (ref 135–145)
TOTAL PROTEIN: 6.9 g/dL (ref 6.0–8.3)
Total Bilirubin: 0.6 mg/dL (ref 0.2–1.2)

## 2014-02-23 LAB — GLUCOSE, POCT (MANUAL RESULT ENTRY): POC GLUCOSE: 137 mg/dL — AB (ref 70–99)

## 2014-02-23 MED ORDER — INSULIN GLARGINE 100 UNIT/ML ~~LOC~~ SOLN: 15.0000 [IU] | Freq: Every day | SUBCUTANEOUS | Status: DC

## 2014-02-23 MED ORDER — GLUCOSE BLOOD VI STRP: ORAL_STRIP | Status: DC

## 2014-02-23 MED ORDER — IBUPROFEN 600 MG PO TABS: 600.0000 mg | ORAL_TABLET | Freq: Three times a day (TID) | ORAL | Status: DC | PRN

## 2014-02-23 MED ORDER — LISINOPRIL 10 MG PO TABS: 10.0000 mg | ORAL_TABLET | Freq: Every day | ORAL | Status: DC

## 2014-02-23 NOTE — Progress Notes (Signed)
Patient presents with hands swollen bilaterally.   Says the flowmax is working well.
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
## 2014-03-06 ENCOUNTER — Ambulatory Visit: Payer: Medicare Other | Admitting: Podiatry

## 2014-03-09 ENCOUNTER — Ambulatory Visit: Payer: Self-pay | Admitting: Podiatry

## 2014-03-16 ENCOUNTER — Ambulatory Visit (INDEPENDENT_AMBULATORY_CARE_PROVIDER_SITE_OTHER): Payer: Medicare Other | Admitting: Podiatry

## 2014-03-16 ENCOUNTER — Encounter: Payer: Self-pay | Admitting: Podiatry

## 2014-03-16 VITALS — BP 157/89 | HR 74 | Resp 16 | Ht 65.0 in | Wt 221.0 lb

## 2014-03-16 DIAGNOSIS — M79673 Pain in unspecified foot: Secondary | ICD-10-CM | POA: Diagnosis not present

## 2014-03-16 DIAGNOSIS — L600 Ingrowing nail: Secondary | ICD-10-CM | POA: Diagnosis not present

## 2014-03-16 DIAGNOSIS — B351 Tinea unguium: Secondary | ICD-10-CM | POA: Diagnosis not present

## 2014-03-16 NOTE — Progress Notes (Signed)
Subjective:     Patient ID: Louis Peterson  Louis Peterson, male   DOB: 09/28/1945, 68 y.o.   MRN: [id number]  HPIpatient presents with caregiver stating he has awful thick toenails that are painful and he cannot cut and he is a long-term diabetic and his doctor wants them cut by a professional   Review of Systems  All other systems reviewed and are negative.      Objective:   Physical Exam  Constitutional: He is oriented to person, place, and time.  Cardiovascular: Intact distal pulses.   Musculoskeletal: Normal range of motion.  Neurological: He is oriented to person, place, and time.  Skin: Skin is warm and dry.  neurovascular status is found to be intact with muscle strength diminished and range of motion subtalar midtarsal joint diminished. Patient is found to have thick yellow brittle nailbeds 1-5 both feet that are painful and does have mild varicosities in the ankle with edema noted     Assessment:     Long-term diabetic with severe mycotic painful nailbeds 1-5 both feet    Plan:

## 2014-03-16 NOTE — Progress Notes (Signed)
   Subjective:    Patient ID: Louis Peterson  Louis Peterson, male    DOB: 02/08/1946, 68 y.o.   MRN: [id number]  HPI Comments: Pt presents for diabetic foot exam and debridement of 10 toenails.     Review of Systems  All other systems reviewed and are negative.      Objective:   Physical Exam        Assessment & Plan:

## 2014-06-19 ENCOUNTER — Ambulatory Visit: Payer: Medicare Other

## 2014-07-07 ENCOUNTER — Telehealth: Payer: Self-pay | Admitting: Family Medicine

## 2014-07-07 NOTE — Telephone Encounter (Signed)
Please call patient. Received notice that he is not picking up lisinopril Patient will need f/u with PCP for HTN.

## 2014-08-07 ENCOUNTER — Encounter: Payer: Self-pay | Admitting: Internal Medicine

## 2014-08-07 ENCOUNTER — Ambulatory Visit: Payer: Medicare Other | Attending: Internal Medicine | Admitting: Internal Medicine

## 2014-08-07 VITALS — BP 130/80 | HR 74 | Temp 98.0°F | Resp 16 | Wt 224.0 lb

## 2014-08-07 DIAGNOSIS — M25442 Effusion, left hand: Secondary | ICD-10-CM | POA: Diagnosis not present

## 2014-08-07 DIAGNOSIS — M25441 Effusion, right hand: Secondary | ICD-10-CM | POA: Insufficient documentation

## 2014-08-07 DIAGNOSIS — M25472 Effusion, left ankle: Secondary | ICD-10-CM | POA: Insufficient documentation

## 2014-08-07 DIAGNOSIS — R350 Frequency of micturition: Secondary | ICD-10-CM | POA: Insufficient documentation

## 2014-08-07 DIAGNOSIS — Z794 Long term (current) use of insulin: Secondary | ICD-10-CM | POA: Insufficient documentation

## 2014-08-07 DIAGNOSIS — N400 Enlarged prostate without lower urinary tract symptoms: Secondary | ICD-10-CM

## 2014-08-07 DIAGNOSIS — E162 Hypoglycemia, unspecified: Secondary | ICD-10-CM | POA: Diagnosis not present

## 2014-08-07 DIAGNOSIS — N401 Enlarged prostate with lower urinary tract symptoms: Secondary | ICD-10-CM | POA: Insufficient documentation

## 2014-08-07 DIAGNOSIS — M545 Low back pain: Secondary | ICD-10-CM | POA: Diagnosis not present

## 2014-08-07 DIAGNOSIS — E139 Other specified diabetes mellitus without complications: Secondary | ICD-10-CM

## 2014-08-07 DIAGNOSIS — R202 Paresthesia of skin: Secondary | ICD-10-CM | POA: Insufficient documentation

## 2014-08-07 DIAGNOSIS — R209 Unspecified disturbances of skin sensation: Secondary | ICD-10-CM | POA: Diagnosis not present

## 2014-08-07 DIAGNOSIS — R3915 Urgency of urination: Secondary | ICD-10-CM | POA: Diagnosis not present

## 2014-08-07 DIAGNOSIS — E119 Type 2 diabetes mellitus without complications: Secondary | ICD-10-CM | POA: Insufficient documentation

## 2014-08-07 DIAGNOSIS — I10 Essential (primary) hypertension: Secondary | ICD-10-CM | POA: Diagnosis not present

## 2014-08-07 LAB — POCT URINALYSIS DIPSTICK
Bilirubin, UA: NEGATIVE
Blood, UA: NEGATIVE
Glucose, UA: NEGATIVE
KETONES UA: NEGATIVE
Leukocytes, UA: NEGATIVE
NITRITE UA: NEGATIVE
PROTEIN UA: NEGATIVE
Spec Grav, UA: 1.02
Urobilinogen, UA: 0.2
pH, UA: 6

## 2014-08-07 LAB — COMPLETE METABOLIC PANEL WITH GFR
ALBUMIN: 4.1 g/dL (ref 3.5–5.2)
ALT: 13 U/L (ref 0–53)
AST: 15 U/L (ref 0–37)
Alkaline Phosphatase: 116 U/L (ref 39–117)
BUN: 13 mg/dL (ref 6–23)
CALCIUM: 9.6 mg/dL (ref 8.4–10.5)
CO2: 26 meq/L (ref 19–32)
CREATININE: 0.87 mg/dL (ref 0.50–1.35)
Chloride: 103 mEq/L (ref 96–112)
GFR, EST NON AFRICAN AMERICAN: 89 mL/min
GLUCOSE: 79 mg/dL (ref 70–99)
Potassium: 4.7 mEq/L (ref 3.5–5.3)
Sodium: 142 mEq/L (ref 135–145)
Total Bilirubin: 0.5 mg/dL (ref 0.2–1.2)
Total Protein: 7.5 g/dL (ref 6.0–8.3)

## 2014-08-07 LAB — POCT GLYCOSYLATED HEMOGLOBIN (HGB A1C): HEMOGLOBIN A1C: 6.2

## 2014-08-07 LAB — GLUCOSE, POCT (MANUAL RESULT ENTRY): POC Glucose: 128 mg/dl — AB (ref 70–99)

## 2014-08-07 MED ORDER — TAMSULOSIN HCL 0.4 MG PO CAPS: 0.4000 mg | ORAL_CAPSULE | Freq: Every day | ORAL | Status: DC

## 2014-08-07 MED ORDER — METFORMIN HCL 500 MG PO TABS: 500.0000 mg | ORAL_TABLET | Freq: Two times a day (BID) | ORAL | Status: DC

## 2014-08-07 MED ORDER — LISINOPRIL 10 MG PO TABS: 10.0000 mg | ORAL_TABLET | Freq: Every day | ORAL | Status: DC

## 2014-08-07 MED ORDER — VITAMIN B-12 1000 MCG PO TABS: 1000.0000 ug | ORAL_TABLET | Freq: Every day | ORAL | Status: DC

## 2014-08-07 NOTE — Patient Instructions (Addendum)
La diabetes mellitus y los alimentos (Diabetes Mellitus and Food) Es importante que controle su nivel de azcar en la sangre (glucosa). El nivel de glucosa en sangre depende en gran medida de lo que usted come. Comer alimentos saludables en las cantidades adecuadas a lo largo del da, aproximadamente a la misma hora todos los das, lo ayudar a controlar su nivel de glucosa en sangre. Tambin puede ayudarlo a retrasar o evitar el empeoramiento de la diabetes mellitus. Comer de manera saludable incluso puede ayudarlo a mejorar el nivel de presin arterial y a alcanzar o mantener un peso saludable.  CMO PUEDEN AFECTARME LOS ALIMENTOS? Carbohidratos Los carbohidratos afectan el nivel de glucosa en sangre ms que cualquier otro tipo de alimento. El nutricionista lo ayudar a determinar cuntos carbohidratos puede consumir en cada comida y ensearle a contarlos. El recuento de carbohidratos es importante para mantener la glucosa en sangre en un nivel saludable, en especial si utiliza insulina o toma determinados medicamentos para la diabetes mellitus. Alcohol El alcohol puede provocar disminuciones sbitas de la glucosa en sangre (hipoglucemia), en especial si utiliza insulina o toma determinados medicamentos para la diabetes mellitus. La hipoglucemia es una afeccin que puede poner en peligro la vida. Los sntomas de la hipoglucemia (somnolencia, mareos y desorientacin) son similares a los sntomas de haber consumido mucho alcohol.  Si el mdico lo autoriza a beber alcohol, hgalo con moderacin y siga estas pautas:  Las mujeres no deben beber ms de un trago por da, y los hombres no deben beber ms de dos tragos por da. Un trago es igual a:  12 onzas (355 ml) de cerveza  5 onzas de vino (150 ml) de vino  1,5onzas (45ml) de bebidas espirituosas  No beba con el estmago vaco.  Mantngase hidratado. Beba agua, gaseosas dietticas o t helado sin azcar.  Las gaseosas comunes, los jugos y  otros refrescos podran contener muchos carbohidratos y se deben contar. QU ALIMENTOS NO SE RECOMIENDAN? Cuando haga las elecciones de alimentos, es importante que recuerde que todos los alimentos son distintos. Algunos tienen menos nutrientes que otros por porcin, aunque podran tener la misma cantidad de caloras o carbohidratos. Es difcil darle al cuerpo lo que necesita cuando consume alimentos con menos nutrientes. Estos son algunos ejemplos de alimentos que debera evitar ya que contienen muchas caloras y carbohidratos, pero pocos nutrientes:  Grasas trans (la mayora de los alimentos procesados incluyen grasas trans en la etiqueta de Informacin nutricional).  Gaseosas comunes.  Jugos.  Caramelos.  Dulces, como tortas, pasteles, rosquillas y galletas.  Comidas fritas. QU ALIMENTOS PUEDO COMER? Consuma alimentos ricos en nutrientes, que nutrirn el cuerpo y lo mantendrn saludable. Los alimentos que debe comer tambin dependern de varios factores, como:  Las caloras que necesita.  Los medicamentos que toma.  Su peso.  El nivel de glucosa en sangre.  El nivel de presin arterial.  El nivel de colesterol. Tambin debe consumir una variedad de alimentos, como:  Protenas, como carne, aves, pescado, tofu, frutos secos y semillas (las protenas de animales magros son mejores).  Frutas.  Verduras.  Productos lcteos, como leche, queso y yogur (descremados son mejores).  Panes, granos, pastas, cereales, arroz y frijoles.  Grasas, como aceite de oliva, margarina sin grasas trans, aceite de canola, aguacate y aceitunas. TODOS LOS QUE PADECEN DIABETES MELLITUS TIENEN EL MISMO PLAN DE COMIDAS? Dado que todas las personas que padecen diabetes mellitus son distintas, no hay un solo plan de comidas que funcione para todos. Es muy   importante que se rena con un nutricionista que lo ayudar a crear un plan de comidas adecuado para usted. Document Released: 07/29/2007  Document Revised: 04/26/2013 [employer] Patient Information 2015 [employer]. This information is not intended to replace advice given to you by your health care provider. Make sure you discuss any questions you have with your health care provider.  

## 2014-08-07 NOTE — Progress Notes (Signed)
Subjective:     Patient ID: Louis DenseJ  Louis Peterson, male   DOB: 08/22/1945, 69 y.o.   MRN: [id number]  HPI  Louis Peterson is a 69yo Spanish-speaking male with a history of hypertension, diabetes, and BPH here today for a follow up visit. Patient reports taking all of his medications daily and needs refills for lisinopril and tamsulosin.   Patient's complains of increased urinary frequency and urgency with difficulty urinating since he ran out of refills for tamsulosin 26mo ago. Wakes up 5 times at night with urge to go to the bathroom but has difficulty voiding. He says it's painful to urinate and feels a burning sensation when he tries to urinate. He denies any hematuria. No fatigue, night sweats, weight loss, or new onset of low back pain.  Patient has been taking 10 mg insulin glargine everyday. His blood sugar levels at home range from 71 to 120. He reports experiencing headaches, sweatiness, and some lightheadedness when his sugar level is in the 70's.  He denies any presyncope or syncope. Denies nausea, vomiting, constipation, diarrhea. He also complains of persistent numbness and swelling in both hands and in left ankle for the past few months. He describes the numbness as being constant without any noted aggravating or alleviating factors. The swelling in his hands seems to increase and decrease randomly without any noted aggravating or alleviating factors. Patient had surgery in his left foot in 08/2013 and complains of mild swelling since. He denies weakness and tremors.  Outpatient Encounter Prescriptions as of 08/07/2014  Medication Sig  . glucose blood test strip Use as instructed  . ibuprofen (ADVIL,MOTRIN) 600 MG tablet Take 1 tablet (600 mg total) by mouth every 8 (eight) hours as needed.  . insulin glargine (LANTUS) 100 UNIT/ML injection Inject 0.15 mLs (15 Units total) into the skin daily.  Marland Kitchen. lisinopril (PRINIVIL,ZESTRIL) 10 MG tablet Take 1 tablet (10 mg total) by mouth daily.  .  metFORMIN (GLUCOPHAGE) 500 MG tablet Take 1 tablet (500 mg total) by mouth 2 (two) times daily with a meal.  . potassium chloride SA (K-DUR,KLOR-CON) 20 MEQ tablet Take 2 tablets (40 mEq total) by mouth daily.  . tamsulosin (FLOMAX) 0.4 MG CAPS capsule Take 1 capsule (0.4 mg total) by mouth daily after supper.  . terbinafine (LAMISIL) 250 MG tablet Take 1 tablet (250 mg total) by mouth daily.  . vitamin B-12 (CYANOCOBALAMIN) 1000 MCG tablet Take 1 tablet (1,000 mcg total) by mouth daily.  . [DISCONTINUED] lisinopril (PRINIVIL,ZESTRIL) 10 MG tablet Take 1 tablet (10 mg total) by mouth daily.  . [DISCONTINUED] tamsulosin (FLOMAX) 0.4 MG CAPS capsule Take 1 capsule (0.4 mg total) by mouth daily after supper.     Review of Systems  Cardiovascular:       Positive for left ankle edema.  Endocrine: Positive for polyuria.  Genitourinary: Positive for urgency, frequency, decreased urine volume and difficulty urinating. Negative for hematuria.  Neurological:       Positive for numbness in both hands and left ankle. No weakness.        Objective: Filed Vitals:   08/07/14 1224  BP: 130/80  Pulse:   Temp:   Resp:       Physical Exam  Constitutional:  Well-appearing 69 yo Spanish-speaking male alert, oriented, and in no acute distress.   Cardiovascular: Normal rate, regular rhythm and normal heart sounds.   Pulmonary/Chest: Effort normal and breath sounds normal.  Neurological: He has normal reflexes. He exhibits normal muscle tone.  Assessment:    Louis Peterson is a 69yo Spanish-speaking male with a history of hypertension, diabetes, and BPH here today for a follow up visit.  Frequent Urination Since patient's began experiencing urinary symptoms since running out of refills for Tamsulosin, it is most likely that these symptoms are due to urinary tract obstruction from BPH. Dipstick urinalysis performed today showed no abnormalities. Diagnosis of prostate malignancy is unlikely due to  lack of constitutional symptoms such as fatigue, weight loss, night sweats, and new onset low back pain. Will refill prescription for Tamsulosin and refer patient to urology for follow up and further management since he has not been seen by urology in more than a year.   Diabetes & Paresthesia of Hands and Left foot Patient's diabetes seems to be fairly well-controlled, and his hemoglobin A1C today is 6.2, which is slightly higher than last measured A1C of 5.8 on 12/09/13. Given patient's frequent episodes of hypoglycemia and reasonable A1C today, will discontinue insulin and start patient on metformin. Counseled patient on how to manage symptoms of hypoglycemia and importance of healthy diet and exercise to prevent diabetic complications.  Given his A1C < 7 today, parasthesias in both hands and left foot seem unlikely to be a result of poorly contolled diabetes and may instead be due to a vitamin B12 deficiency. Was unable to order labs to check vitamin B12 due to lack of coverage by patient's insurance. Will start patient on over the counter 1000 mcg vitamin B12 supplement and check for improvement of symptoms at follow-up visit. Given history of left foot surgery and new onset numbness, will refer patient to podiatrist for follow up care to monitor for signs of diabetic neuropathy.   Hypertension  Patient did not take his lisinopril this morning which might explain his slightly high blood pressure of 130/80 measured in clinic today. Will refill prescription for  lisinopril.     Plan:     Louis Peterson was seen today for follow-up.  Diagnoses and all orders for this visit:  Other specified diabetes mellitus without complications Orders: -     Glucose (CBG) -     HgB A1c -     metFORMIN (GLUCOPHAGE) 500 MG tablet; Take 1 tablet (500 mg total) by mouth 2 (two) times daily with a meal.  Frequent urination Orders: -     Urinalysis Dipstick negative for infection. -     tamsulosin (FLOMAX) 0.4  MG CAPS capsule; Take 1 capsule (0.4 mg total) by mouth daily after supper.  Hypoglycemia Patient educated  about the  symptoms  BPH (benign prostatic hyperplasia) Orders: -     tamsulosin (FLOMAX) 0.4 MG CAPS capsule; Take 1 capsule (0.4 mg total) by mouth daily after supper. -     Ambulatory referral to Urology  Essential hypertension, benign Orders: -     lisinopril (PRINIVIL,ZESTRIL) 10 MG tablet; Take 1 tablet (10 mg total) by mouth daily.  Paresthesia of both hands Orders: -     COMPLETE METABOLIC PANEL WITH GFR -     vitamin B-12 (CYANOCOBALAMIN) 1000 MCG tablet; Take 1 tablet (1,000 mcg total) by mouth daily.      Patient was seen with medical student, Agree with above assessment and plan Doris Cheadle, MD

## 2014-08-07 NOTE — Progress Notes (Signed)
Patient here for follow up on his diabetes Complains of frequent urination at night Patient has been out of his flomax Requesting medication refills

## 2014-08-09 ENCOUNTER — Telehealth: Payer: Self-pay

## 2014-08-09 NOTE — Telephone Encounter (Signed)
Patient not available Phone called does not accept incoming calls Unable to leave message

## 2014-08-09 NOTE — Telephone Encounter (Signed)
-----   Message from Doris Cheadleeepak Advani, MD sent at 08/08/2014  9:27 AM EDT ----- Call and let the Patient know that blood work is normal.

## 2014-08-22 ENCOUNTER — Emergency Department (HOSPITAL_COMMUNITY)
Admission: EM | Admit: 2014-08-22 | Discharge: 2014-08-23 | Disposition: A | Discharge status: Home / Self Care | Payer: Medicare Other | Attending: Emergency Medicine | Admitting: Emergency Medicine

## 2014-08-22 ENCOUNTER — Emergency Department (HOSPITAL_COMMUNITY): Payer: Medicare Other

## 2014-08-22 ENCOUNTER — Encounter (HOSPITAL_COMMUNITY): Payer: Self-pay

## 2014-08-22 DIAGNOSIS — Z87438 Personal history of other diseases of male genital organs: Secondary | ICD-10-CM | POA: Insufficient documentation

## 2014-08-22 DIAGNOSIS — R1030 Lower abdominal pain, unspecified: Secondary | ICD-10-CM

## 2014-08-22 DIAGNOSIS — Z8739 Personal history of other diseases of the musculoskeletal system and connective tissue: Secondary | ICD-10-CM | POA: Diagnosis not present

## 2014-08-22 DIAGNOSIS — Z8619 Personal history of other infectious and parasitic diseases: Secondary | ICD-10-CM | POA: Insufficient documentation

## 2014-08-22 DIAGNOSIS — K5909 Other constipation: Secondary | ICD-10-CM

## 2014-08-22 DIAGNOSIS — Z79899 Other long term (current) drug therapy: Secondary | ICD-10-CM | POA: Insufficient documentation

## 2014-08-22 DIAGNOSIS — R1031 Right lower quadrant pain: Secondary | ICD-10-CM | POA: Diagnosis not present

## 2014-08-22 DIAGNOSIS — K5730 Diverticulosis of large intestine without perforation or abscess without bleeding: Secondary | ICD-10-CM | POA: Diagnosis not present

## 2014-08-22 DIAGNOSIS — K5900 Constipation, unspecified: Secondary | ICD-10-CM

## 2014-08-22 DIAGNOSIS — R1032 Left lower quadrant pain: Secondary | ICD-10-CM | POA: Diagnosis present

## 2014-08-22 DIAGNOSIS — I10 Essential (primary) hypertension: Secondary | ICD-10-CM | POA: Diagnosis not present

## 2014-08-22 HISTORY — DX: Type 2 diabetes mellitus without complications: E11.9

## 2014-08-22 LAB — COMPREHENSIVE METABOLIC PANEL
ALBUMIN: 3.4 g/dL — AB (ref 3.5–5.2)
ALT: 15 U/L (ref 0–53)
ANION GAP: 12 (ref 5–15)
AST: 19 U/L (ref 0–37)
Alkaline Phosphatase: 97 U/L (ref 39–117)
BUN: 10 mg/dL (ref 6–23)
CHLORIDE: 99 mmol/L (ref 96–112)
CO2: 26 mmol/L (ref 19–32)
Calcium: 8.2 mg/dL — ABNORMAL LOW (ref 8.4–10.5)
Creatinine, Ser: 1.05 mg/dL (ref 0.50–1.35)
GFR calc Af Amer: 82 mL/min — ABNORMAL LOW (ref >90)
GFR calc non Af Amer: 70 mL/min — ABNORMAL LOW (ref >90)
Glucose, Bld: 122 mg/dL — ABNORMAL HIGH (ref 70–99)
POTASSIUM: 4.5 mmol/L (ref 3.5–5.1)
Sodium: 137 mmol/L (ref 135–145)
TOTAL PROTEIN: 6.7 g/dL (ref 6.0–8.3)
Total Bilirubin: 0.8 mg/dL (ref 0.3–1.2)

## 2014-08-22 LAB — CBC WITH DIFFERENTIAL/PLATELET
Basophils Absolute: 0 10*3/uL (ref 0.0–0.1)
Basophils Relative: 0 % (ref 0–1)
EOS PCT: 0 % (ref 0–5)
Eosinophils Absolute: 0 10*3/uL (ref 0.0–0.7)
HCT: 47.7 % (ref 39.0–52.0)
Hemoglobin: 16.2 g/dL (ref 13.0–17.0)
LYMPHS ABS: 1.4 10*3/uL (ref 0.7–4.0)
Lymphocytes Relative: 15 % (ref 12–46)
MCH: 30.6 pg (ref 26.0–34.0)
MCHC: 34 g/dL (ref 30.0–36.0)
MCV: 90.2 fL (ref 78.0–100.0)
Monocytes Absolute: 0.6 10*3/uL (ref 0.1–1.0)
Monocytes Relative: 7 % (ref 3–12)
Neutro Abs: 7.1 10*3/uL (ref 1.7–7.7)
Neutrophils Relative %: 78 % — ABNORMAL HIGH (ref 43–77)
PLATELETS: 174 10*3/uL (ref 150–400)
RBC: 5.29 MIL/uL (ref 4.22–5.81)
RDW: 14 % (ref 11.5–15.5)
WBC: 9.1 10*3/uL (ref 4.0–10.5)

## 2014-08-22 MED ORDER — MORPHINE SULFATE 4 MG/ML IJ SOLN
4.0000 mg | Freq: Once | INTRAMUSCULAR | Status: AC
  Administered 2014-08-22: 4 mg via INTRAVENOUS
  Filled 2014-08-22: qty 1

## 2014-08-22 MED ORDER — ONDANSETRON HCL 4 MG/2ML IJ SOLN
4.0000 mg | Freq: Once | INTRAMUSCULAR | Status: AC
  Administered 2014-08-22: 4 mg via INTRAVENOUS
  Filled 2014-08-22: qty 2

## 2014-08-22 MED ORDER — SODIUM CHLORIDE 0.9 % IV SOLN
Freq: Once | INTRAVENOUS | Status: AC
  Administered 2014-08-22: 10 mL/h via INTRAVENOUS

## 2014-08-22 MED ORDER — IOHEXOL 300 MG/ML  SOLN
100.0000 mL | Freq: Once | INTRAMUSCULAR | Status: AC | PRN
  Administered 2014-08-22: 100 mL via INTRAVENOUS

## 2014-08-22 MED ORDER — IOHEXOL 300 MG/ML  SOLN
25.0000 mL | Freq: Once | INTRAMUSCULAR | Status: AC | PRN
  Administered 2014-08-22: 25 mL via ORAL

## 2014-08-22 NOTE — ED Notes (Signed)
Pt here for abd pain and no normal bowel movement for over a month.
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
## 2014-08-23 LAB — URINALYSIS, ROUTINE W REFLEX MICROSCOPIC
Bilirubin Urine: NEGATIVE
Glucose, UA: NEGATIVE mg/dL
HGB URINE DIPSTICK: NEGATIVE
Ketones, ur: NEGATIVE mg/dL
Leukocytes, UA: NEGATIVE
NITRITE: NEGATIVE
PROTEIN: NEGATIVE mg/dL
Specific Gravity, Urine: 1.019 (ref 1.005–1.030)
UROBILINOGEN UA: 0.2 mg/dL (ref 0.0–1.0)
pH: 6 (ref 5.0–8.0)

## 2014-08-23 MED ORDER — HYDROCODONE-ACETAMINOPHEN 5-325 MG PO TABS: 1.0000 | ORAL_TABLET | ORAL | Status: DC | PRN

## 2014-08-23 MED ORDER — POLYETHYLENE GLYCOL 3350 17 G PO PACK: 17.0000 g | PACK | Freq: Every day | ORAL | Status: DC

## 2014-08-23 NOTE — Discharge Instructions (Signed)
Dolor abdominal (Abdominal Pain) El dolor puede tener muchas causas. Normalmente la causa del dolor abdominal no es una enfermedad y Scientist, clinical (histocompatibility and immunogenetics)mejorar sin TEFL teachertratamiento. Frecuentemente puede controlarse y tratarse en casa. Su mdico le Medical sales representativerealizar un examen fsico y posiblemente solicite anlisis de sangre y radiografas para ayudar a Chief Strategy Officerdeterminar la gravedad de su dolor. Sin embargo, en IAC/InterActiveCorpmuchos casos, debe transcurrir ms tiempo antes de que se pueda Clinical research associateencontrar una causa evidente del dolor. Antes de llegar a ese punto, es posible que su mdico no sepa si necesita ms pruebas o un tratamiento ms profundo. INSTRUCCIONES PARA EL CUIDADO EN EL HOGAR  Est atento al dolor para ver si hay cambios. Las siguientes indicaciones ayudarn a Architectural technologistaliviar cualquier molestia que pueda sentir:  [place] solo medicamentos de venta libre o recetados, segn las indicaciones del mdico.  No tome laxantes a menos que se lo haya indicado su mdico.  Pruebe con Neomia Dearuna dieta lquida absoluta (caldo, t o agua) segn se lo indique su mdico. Introduzca gradualmente una dieta normal, segn su tolerancia. SOLICITE ATENCIN MDICA SI:  Tiene dolor abdominal sin explicacin.  Tiene dolor abdominal relacionado con nuseas o diarrea.  Tiene dolor cuando orina o defeca.  Experimenta dolor abdominal que lo despierta de noche.  Tiene dolor abdominal que empeora o mejora cuando come alimentos.  Tiene dolor abdominal que empeora cuando come alimentos grasosos.  Tiene fiebre. SOLICITE ATENCIN MDICA DE INMEDIATO SI:   El dolor no desaparece en un plazo mximo de 2horas.  No deja de (vomitar).  El Engineer, miningdolor se siente solo en partes del abdomen, como el lado derecho o la parte inferior izquierda del abdomen.  Evaca materia fecal sanguinolenta o negra, de aspecto alquitranado. ASEGRESE DE QUE:  Comprende estas instrucciones.  Controlar su afeccin.  Recibir ayuda de inmediato si no mejora o si empeora. Document Released: 04/21/2005  Document Revised: 04/26/2013 [facility] Patient Information 2015 [place], [place]. This information is not intended to replace advice given to you by your health care provider. Make sure you discuss any questions you have with your health care provider.  Hinchazn o distensin abdominal (Bloating) La distensin abdominal es la sensacin de plenitud del abdomen. Puede ser que sienta que sus pantalones estn muy ajustados. A menudo la causa de la hinchazn es comer demasiado, retener lquidos o tener gases en el intestino. Tambin est ocasionado por tragar aire y comer alimentos que causan gases. El sndrome del colon irritable es una de una de las casuas ms comunes de la hinchazn. La constipacin tambin es una causa comn. A veces esto puede estar ocasionado por problemas ms serios. SNTOMAS Suele haber una sensacin de plenitud, y su abdomen salido para afuera. Puede haber molestias leves.  DIAGNSTICO En la mayora de los casos de distensin abdominal no suele realizarse ningn anlisis en particular. Si la enfermedad persiste y Advertising account executiveparece empeorar, el mdico podr realizarle una prueba adicional.  TRATAMIENTO  No existe un tratamiento directo para la hinchazn.  No haga que el intestino se llene de gas. Puede ayudar a que esto no suceda. Evite mascar chicle y comer caramelos. Con esto se tiende a Psychologist, sport and exercisetragar aire. El tragar aire tambin puede ser un hbito nervioso. Trate de evitarlo.  Evitar dietas altas en residuos tambin ayudar. Consuma alimentos con fibras solubles y sustituya los productos lcteos con soja y Harlon Dittyarroz. Esto ayuda para el sndrome de colon irritable.  Si la causa es la constipacin, una dieta alta en residuos con ms fibra ayudar.  Evite las bebidas carbonatadas.  Los preparados  de venta libre [employer] a reducir gases. El farmacutico lo ayudar. SOLICITE ATENCIN MDICA SI:  La distensin contina y Advertising account executive.  Nota que aumenta de [place].  Tiene prdida de  peso pero la hinchazn empeora.  Tiene cambios en sus hbitos al ir de cuerpo, o siente nuseas o vmitos. SOLICITE ATENCIN MDICA DE INMEDIATO SI:  Presenta dificultades para respirar o hinchazn en las piernas.  Siente un aumento del dolor abdominal o dolor en el pecho. Document Released: 07/18/2008 Document Revised: 07/14/2011 [facility] Patient Information 2015 [place], [place]. This information is not intended to replace advice given to you by your health care provider. Make sure you discuss any questions you have with your health care provider. Estreimiento (Constipation) Estreimiento significa que una persona tiene menos de tres evacuaciones en una semana, dificultad para defecar, o que las heces son secas, duras, o ms grandes que lo normal. A medida que envejecemos el estreimiento es ms comn. Si intenta curar el estreimiento con medicamentos que producen la evacuacin de las heces (laxantes), el problema puede empeorar. El uso prolongado de laxantes puede hacer que los msculos del colon se debiliten. Una dieta baja en fibra, no tomar suficientes lquidos y el uso de ciertos medicamentos pueden Scientist, research (life sciences).  CAUSAS   Ciertos medicamentos, como los antidepresivos, analgsicos, suplementos de hierro, anticidos y diurticos.  Algunas enfermedades, como la diabetes, el sndrome del colon irritable, enfermedad de la tiroides, o depresin.  No beber suficiente agua.  No consumir suficientes alimentos ricos en fibra.  Situaciones de estrs o viajes.  Falta de actividad fsica o de ejercicio.  Ignorar la necesidad sbita de Advertising copywriter.  Uso en exceso de laxantes. SIGNOS Y SNTOMAS   Defecar menos de tres veces por semana.  Dificultad para defecar.  Tener las heces secas y duras, o ms grandes que las normales.  Sensacin de estar lleno o hinchado.  Dolor en la parte baja del abdomen.  No sentir alivio despus de defecar. DIAGNSTICO  El mdico le har una  historia clnica y un examen fsico. Pueden hacerle exmenes adicionales para el estreimiento grave. Estos estudios pueden ser:  Un radiografa con enema de bario para examinar el recto, el colon y, en algunos casos, el intestino delgado.  Una sigmoidoscopia para examinar el colon inferior.  Una colonoscopia para examinar todo el colon. TRATAMIENTO  El tratamiento depender de la gravedad del estreimiento y de la causa. Algunos tratamientos nutricionales son beber ms lquidos y comer ms alimentos ricos en fibra. El cambio en el estilo de vida incluye hacer ejercicios de [place] regular. Si estas recomendaciones para Public relations account executive dieta y en el estilo de vida no ayudan, el mdico le puede indicar el uso de laxantes de venta libre para ayudarlo a Advertising copywriter. Los medicamentos recetados se pueden prescribir si los medicamentos de venta libre no lo [place].  INSTRUCCIONES PARA EL CUIDADO EN EL HOGAR   Consuma alimentos con alto contenido de [place], como frutas, vegetales, cereales integrales y porotos.  Limite los alimentos procesados ricos en grasas y azcar, como las papas fritas, hamburguesas, galletas, dulces y refrescos.  Puede agregar un suplemento de fibra a su dieta si no obtiene lo suficiente de los alimentos.  Beba suficiente lquido para Photographer orina clara o de color amarillo plido.  Haga ejercicio regularmente o segn las indicaciones del mdico.  Vaya al bao cuando sienta la necesidad de ir. No se aguante las ganas.  [employer] solo medicamentos de venta libre o recetados,  segn las indicaciones del mdico. No tome otros medicamentos para el estreimiento sin consultarlo antes con su mdico. SOLICITE ATENCIN MDICA DE INMEDIATO SI:   Observa sangre brillante en las heces.  El estreimiento dura ms de 4 das o [place].  Siente dolor abdominal o rectal.  Las heces son delgadas como un lpiz.  Pierde peso de [place] inexplicable. ASEGRESE DE QUE:   Comprende estas  instrucciones.  Controlar su afeccin.  Recibir ayuda de inmediato si no mejora o si empeora. Document Released: 05/11/2007 Document Revised: 04/26/2013 [facility] Patient Information 2015 [place], [place]. This information is not intended to replace advice given to you by your health care provider. Make sure you discuss any questions you have with your health care provider.

## 2014-09-13 ENCOUNTER — Ambulatory Visit: Payer: Medicare Other | Admitting: Internal Medicine

## 2014-12-27 ENCOUNTER — Other Ambulatory Visit: Payer: Self-pay | Admitting: Internal Medicine

## 2014-12-28 ENCOUNTER — Telehealth: Payer: Self-pay | Admitting: Internal Medicine

## 2014-12-28 NOTE — Telephone Encounter (Signed)
Pt is requesting a refill for tamsulosin (FLOMAX) 0.4 MG CAPS capsule. Patient is needing this filled before he leaves out of town by this Friday if possible. Please follow up with pt. Pt is hoping to be prescribed a 30 day supply.

## 2015-01-26 NOTE — Telephone Encounter (Signed)
Louis Peterson walked in this morning checking on the status of her fathers medication refill. He is now already out of town, and is still in need of the below mentioned medication along with a refill for his blood pressure medication. If a sample is granted, the plan is for his daughter to pick up his prescription and send it to him. Please follow up with patient. Thank you.

## 2015-02-07 ENCOUNTER — Telehealth: Payer: Self-pay | Admitting: Internal Medicine

## 2015-02-07 DIAGNOSIS — R350 Frequency of micturition: Secondary | ICD-10-CM

## 2015-02-07 DIAGNOSIS — N400 Enlarged prostate without lower urinary tract symptoms: Secondary | ICD-10-CM

## 2015-02-07 NOTE — Telephone Encounter (Signed)
Patient's daughter [name] called to ask about the status of her father's medication refill for [facility] 0.4MG . Patient is now completely out of this medication and is having trouble with constipation. Please f/u with patient ASAP.

## 2015-04-09 NOTE — Telephone Encounter (Signed)
Patient will need to be seen to establish care with a provider  Has not been seen in the office since april

## 2015-04-25 ENCOUNTER — Other Ambulatory Visit: Payer: Self-pay | Admitting: Internal Medicine

## 2015-06-22 MED FILL — metFORMIN HCL 500 MG TABS: 500 | 30 days supply | Qty: 60 | Fill #8

## 2015-07-30 ENCOUNTER — Encounter (HOSPITAL_COMMUNITY): Payer: Self-pay | Admitting: *Deleted

## 2015-07-30 DIAGNOSIS — Z79899 Other long term (current) drug therapy: Secondary | ICD-10-CM | POA: Insufficient documentation

## 2015-07-30 DIAGNOSIS — Z8739 Personal history of other diseases of the musculoskeletal system and connective tissue: Secondary | ICD-10-CM | POA: Insufficient documentation

## 2015-07-30 DIAGNOSIS — N400 Enlarged prostate without lower urinary tract symptoms: Secondary | ICD-10-CM | POA: Insufficient documentation

## 2015-07-30 DIAGNOSIS — R339 Retention of urine, unspecified: Secondary | ICD-10-CM | POA: Diagnosis present

## 2015-07-30 DIAGNOSIS — K5900 Constipation, unspecified: Secondary | ICD-10-CM | POA: Insufficient documentation

## 2015-07-30 DIAGNOSIS — Z8619 Personal history of other infectious and parasitic diseases: Secondary | ICD-10-CM | POA: Diagnosis not present

## 2015-07-30 DIAGNOSIS — I10 Essential (primary) hypertension: Secondary | ICD-10-CM | POA: Diagnosis not present

## 2015-07-30 DIAGNOSIS — N410 Acute prostatitis: Secondary | ICD-10-CM | POA: Diagnosis not present

## 2015-07-30 DIAGNOSIS — Z7984 Long term (current) use of oral hypoglycemic drugs: Secondary | ICD-10-CM | POA: Diagnosis not present

## 2015-07-30 DIAGNOSIS — E119 Type 2 diabetes mellitus without complications: Secondary | ICD-10-CM | POA: Diagnosis not present

## 2015-07-30 LAB — URINALYSIS, ROUTINE W REFLEX MICROSCOPIC
Bilirubin Urine: NEGATIVE
Glucose, UA: NEGATIVE mg/dL
KETONES UR: NEGATIVE mg/dL
NITRITE: NEGATIVE
Protein, ur: NEGATIVE mg/dL
Specific Gravity, Urine: 1.03 (ref 1.005–1.030)
pH: 5.5 (ref 5.0–8.0)

## 2015-07-30 LAB — URINE MICROSCOPIC-ADD ON

## 2015-07-30 LAB — CBG MONITORING, ED: Glucose-Capillary: 170 mg/dL — ABNORMAL HIGH (ref 65–99)

## 2015-07-30 NOTE — ED Notes (Signed)
Bladder scanned pt 0ml measured pt was able to void no complaints noted at this time triage RN aware

## 2015-07-30 NOTE — ED Notes (Signed)
Pt brought in with daughter for urinary retention and constipation for 7 days. Pt reports dysuria. Pt has hx of prostrate problems. Daughter states pt has been out of prostrate medicine for 3-4 months. Pt reports pain when having a BM, reports small amount and hard stool yesterday.

## 2015-07-31 ENCOUNTER — Emergency Department (HOSPITAL_COMMUNITY)
Admission: EM | Admit: 2015-07-31 | Discharge: 2015-07-31 | Disposition: A | Discharge status: Home / Self Care | Payer: Medicare Other | Attending: Emergency Medicine | Admitting: Emergency Medicine

## 2015-07-31 DIAGNOSIS — I10 Essential (primary) hypertension: Secondary | ICD-10-CM

## 2015-07-31 DIAGNOSIS — N410 Acute prostatitis: Secondary | ICD-10-CM

## 2015-07-31 MED ORDER — CEPHALEXIN 500 MG PO CAPS: 1000.0000 mg | ORAL_CAPSULE | Freq: Two times a day (BID) | ORAL | Status: DC

## 2015-07-31 MED ORDER — LISINOPRIL 10 MG PO TABS: 10.0000 mg | ORAL_TABLET | Freq: Every day | ORAL | Status: DC

## 2015-07-31 MED ORDER — POLYETHYLENE GLYCOL 3350 17 G PO PACK: 17.0000 g | PACK | Freq: Every day | ORAL | Status: DC

## 2015-07-31 MED ORDER — TAMSULOSIN HCL 0.4 MG PO CAPS
0.4000 mg | ORAL_CAPSULE | Freq: Every day | ORAL | Status: DC
Start: 2015-07-31 — End: 2015-08-10

## 2015-07-31 MED ORDER — CEFTRIAXONE SODIUM 1 G IJ SOLR
1.0000 g | Freq: Once | INTRAMUSCULAR | Status: AC
Start: 2015-07-31 — End: 2015-07-31
  Administered 2015-07-31: 1 g via INTRAMUSCULAR
  Filled 2015-07-31: qty 10

## 2015-07-31 NOTE — Discharge Instructions (Signed)
Prostatitis (Prostatitis) La prstata tiene aproximadamente el tamao y la forma de una nuez. Se ubica justo debajo de la vejiga. Produce uno de los componentes del semen, formado por los espermatozoides y los lquidos que ayudan a nutrirlos y transportarlos fuera de los testculos. La prostatitis es una inflamacin de la prstata.  Hay cuatro tipos de prostatitis:  Prostatitis bacteriana aguda. Es el tipo menos frecuente de prostatitis. Comienza rpidamente y, por lo general, est acompaada por una infeccin de la vejiga, fiebre alta y escalofros. Puede ocurrir a cualquier edad.  Prostatitis bacteriana crnica. Es una infeccin bacteriana persistente en la prstata. Generalmente aparece luego de una prostatitis bacteriana aguda que se repite o que no fue tratada adecuadamente. Se puede presentar en hombres de cualquier edad pero es ms comn en los hombres de mediana edad cuya prstata ha comenzado a agrandarse. Los sntomas no son tan graves como los de la prostatitis bacteriana aguda. La principal molestia puede ser en la parte del cuerpo que est delante del recto y debajo del escroto (perineo), en la parte baja del abdomen o en la punta del pene (glande).   Prostatitis crnica (no bacteriana). Es el tipo ms frecuente de prostatitis. Es la inflamacin de la prstata y no se origina por una infeccin bacteriana. Se desconoce su causa y puede estar asociada con infecciones virales o trastornos autoinmunitarios.  Prostatodinia (trastorno del suelo plvico). Est asociada con un aumento del tono muscular en la pelvis que rodea a la prstata. CAUSAS La causa de la prostatitis bacteriana es una infeccin bacteriana. Se desconocen las causas de los otros tipos de prostatitis.  SNTOMAS  Los sntomas pueden variar segn el tipo de prostatitis. Tambin puede ocurrir que coincidan sntomas de diferentes tipos de prostatitis. Los sntomas posibles de cada tipo de prostatitis se enumeran a  continuacin. Prostatitis bacteriana aguda  Dolor al orinar.  Fiebre o escalofros.  Dolor muscular o en las articulaciones.  Dolor lumbar.  Dolor en la zona inferior del abdomen.  Imposibilidad de vaciar la vejiga completamente. Prostatitis bacteriana crnica, prostatitis crnica no bacteriana y prostatodinia  Necesidad urgente y repentina de orinar.  Ganas de orinar con frecuencia.  Dificultad para comenzar a eliminar la orina.  Chorro de orina dbil.  Secrecin por la uretra.  Goteo al terminar de orinar.  Dolor rectal.  Dolor en los testculos, el pene o la punta del pene.  Dolor en el perineo.  Problemas con la funcin sexual.  Eyaculacin dolorosa.  Semen con sangre. DIAGNSTICO  Su mdico le preguntar acerca de sus sntomas para hacer el diagnstico de prostatitis. Se recolectarn y analizarn una o ms muestras de orina (anlisis de orina). Si el resultado del anlisis de orina es negativo para bacterias, su mdico podr palpar su prstata con un dedo (examen dgito rectal). Este examen ayuda a su mdico a determinar si la prstata est inflamada y sensible. Tambin se obtendr una muestra de semen para analizarla. TRATAMIENTO  El tratamiento de la prostatitis depende de la causa. Si la causa es una infeccin bacteriana, se puede tratar con antibiticos. En los casos de prostatitis bacteriana crnica, puede ser necesario el uso de antibiticos durante hasta 1mes o 6semanas. Su mdico puede indicarle que tome baos de asiento para ayudar a aliviar el dolor. Un bao de asiento es un bao de agua caliente en la que las caderas y las nalgas estn bajo el agua. Esto relaja los msculos del suelo plvico y, a menudo, contribuye a aliviar la presin en la prstata.   INSTRUCCIONES PARA EL CUIDADO EN EL HOGAR   Tome todos los medicamentos como le indic el mdico.  Tome baos de asiento segn las indicaciones del mdico. SOLICITE ATENCIN MDICA SI:   Los sntomas  empeoran en lugar de mejorar.  Tiene fiebre. SOLICITE ATENCIN MDICA DE INMEDIATO SI:   Tiene escalofros.  Siente nuseas o vomita.  Se siente mareado o se desmaya.  No puede orinar.  Tiene sangre o cogulos de sangre en la orina. ASEGRESE DE QUE:  Comprende estas instrucciones.  Controlar su afeccin.  Recibir ayuda de inmediato si no mejora o si empeora.   Esta informacin no tiene como fin reemplazar el consejo del mdico. Asegrese de hacerle al mdico cualquier pregunta que tenga.   Document Released: 01/29/2005 Document Revised: 05/12/2014 Elsevier Interactive Patient Education 2016 Elsevier Inc.  

## 2015-07-31 NOTE — ED Notes (Signed)
Patient A&O x4, ambulatory with steady gait at discharge. NAD

## 2015-07-31 NOTE — ED Notes (Signed)
[name] reports [place] was seen in [place] yesterday and forgot prescribed medications for urinary difficulties and constipation, reports BM yesterday.  Denies any pain at this time.
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
## 2015-08-10 ENCOUNTER — Encounter: Payer: Self-pay | Admitting: Family Medicine

## 2015-08-10 ENCOUNTER — Ambulatory Visit: Payer: Medicare Other | Attending: Family Medicine | Admitting: Family Medicine

## 2015-08-10 VITALS — BP 162/85 | HR 77 | Temp 98.0°F | Resp 16 | Wt 213.0 lb

## 2015-08-10 DIAGNOSIS — B351 Tinea unguium: Secondary | ICD-10-CM | POA: Insufficient documentation

## 2015-08-10 DIAGNOSIS — E669 Obesity, unspecified: Secondary | ICD-10-CM | POA: Diagnosis not present

## 2015-08-10 DIAGNOSIS — Z7984 Long term (current) use of oral hypoglycemic drugs: Secondary | ICD-10-CM | POA: Insufficient documentation

## 2015-08-10 DIAGNOSIS — N400 Enlarged prostate without lower urinary tract symptoms: Secondary | ICD-10-CM | POA: Diagnosis not present

## 2015-08-10 DIAGNOSIS — K5909 Other constipation: Secondary | ICD-10-CM | POA: Diagnosis not present

## 2015-08-10 DIAGNOSIS — E119 Type 2 diabetes mellitus without complications: Secondary | ICD-10-CM

## 2015-08-10 DIAGNOSIS — E538 Deficiency of other specified B group vitamins: Secondary | ICD-10-CM | POA: Diagnosis not present

## 2015-08-10 DIAGNOSIS — Z79899 Other long term (current) drug therapy: Secondary | ICD-10-CM | POA: Insufficient documentation

## 2015-08-10 DIAGNOSIS — N410 Acute prostatitis: Secondary | ICD-10-CM | POA: Insufficient documentation

## 2015-08-10 DIAGNOSIS — R5382 Chronic fatigue, unspecified: Secondary | ICD-10-CM | POA: Diagnosis not present

## 2015-08-10 DIAGNOSIS — R300 Dysuria: Secondary | ICD-10-CM | POA: Insufficient documentation

## 2015-08-10 DIAGNOSIS — E1169 Type 2 diabetes mellitus with other specified complication: Secondary | ICD-10-CM | POA: Insufficient documentation

## 2015-08-10 DIAGNOSIS — I10 Essential (primary) hypertension: Secondary | ICD-10-CM | POA: Diagnosis not present

## 2015-08-10 LAB — POCT URINALYSIS DIPSTICK
Bilirubin, UA: NEGATIVE
Glucose, UA: NEGATIVE
Ketones, UA: NEGATIVE
NITRITE UA: NEGATIVE
PROTEIN UA: NEGATIVE
SPEC GRAV UA: 1.02
UROBILINOGEN UA: 0.2
pH, UA: 6.5

## 2015-08-10 LAB — TSH: TSH: 2.32 m[IU]/L (ref 0.40–4.50)

## 2015-08-10 LAB — CBC
HCT: 45.1 % (ref 38.5–50.0)
Hemoglobin: 15.2 g/dL (ref 13.2–17.1)
MCH: 29.7 pg (ref 27.0–33.0)
MCHC: 33.7 g/dL (ref 32.0–36.0)
MCV: 88.1 fL (ref 80.0–100.0)
MPV: 10.3 fL (ref 7.5–12.5)
Platelets: 363 10*3/uL (ref 140–400)
RBC: 5.12 MIL/uL (ref 4.20–5.80)
RDW: 15 % (ref 11.0–15.0)
WBC: 9.5 10*3/uL (ref 3.8–10.8)

## 2015-08-10 LAB — LIPID PANEL
Cholesterol: 195 mg/dL (ref 125–200)
HDL: 52 mg/dL (ref >=40)
LDL CALC: 114 mg/dL (ref <130)
Total CHOL/HDL Ratio: 3.8 Ratio (ref <=5.0)
Triglycerides: 144 mg/dL (ref <150)
VLDL: 29 mg/dL (ref <30)

## 2015-08-10 LAB — COMPLETE METABOLIC PANEL WITH GFR
ALT: 18 U/L (ref 9–46)
AST: 15 U/L (ref 10–35)
Albumin: 3.8 g/dL (ref 3.6–5.1)
Alkaline Phosphatase: 95 U/L (ref 40–115)
BILIRUBIN TOTAL: 0.5 mg/dL (ref 0.2–1.2)
BUN: 14 mg/dL (ref 7–25)
CO2: 27 mmol/L (ref 20–31)
Calcium: 9.4 mg/dL (ref 8.6–10.3)
Chloride: 102 mmol/L (ref 98–110)
Creat: 0.86 mg/dL (ref 0.70–1.18)
GFR, EST NON AFRICAN AMERICAN: 88 mL/min (ref >=60)
GFR, Est African American: >89 mL/min (ref >=60)
GLUCOSE: 88 mg/dL (ref 65–99)
Potassium: 4.7 mmol/L (ref 3.5–5.3)
SODIUM: 140 mmol/L (ref 135–146)
TOTAL PROTEIN: 7.4 g/dL (ref 6.1–8.1)

## 2015-08-10 LAB — VITAMIN B12: VITAMIN B 12: 612 pg/mL (ref 200–1100)

## 2015-08-10 MED ORDER — METFORMIN HCL 500 MG PO TABS: 500.0000 mg | ORAL_TABLET | Freq: Two times a day (BID) | ORAL | Status: DC

## 2015-08-10 MED ORDER — TERBINAFINE HCL 250 MG PO TABS: 250.0000 mg | ORAL_TABLET | Freq: Every day | ORAL | Status: DC

## 2015-08-10 MED ORDER — LISINOPRIL 20 MG PO TABS: 20.0000 mg | ORAL_TABLET | Freq: Every day | ORAL | Status: DC

## 2015-08-10 MED ORDER — TAMSULOSIN HCL 0.4 MG PO CAPS: 0.8000 mg | ORAL_CAPSULE | Freq: Every day | ORAL | Status: DC

## 2015-08-10 NOTE — Assessment & Plan Note (Signed)
Acute prostatitis Complete keflex UA and U culture today

## 2015-08-10 NOTE — Assessment & Plan Note (Signed)
Onychomycosis lamisl CMP Podiatry referral

## 2015-08-10 NOTE — Addendum Note (Signed)
Addended by: Dessa PhiFUNCHES, Chellie Vanlue on: 08/10/2015 02:30 PM   Modules accepted: Orders

## 2015-08-10 NOTE — Progress Notes (Signed)
Subjective:  Patient ID: Louis Peterson, male    DOB: 11/10/1945  Age: 70 y.o. MRN: [id number]  CC: Prostatitis and Diabetes  Spanish interpreter used  HPI Louis Peterson has BPH, DM2, HTN, chronic constipation presents to establish care with me   1. Prostatitis: 2 months of dysuria, frequency, hesitancy. No hematuria. He has hx of BPH with PSA up to 6.55 on 11/01/2013. He takes flomax 0.4 mg daily since ED visit on 07/31/2015. He is also completing a course of keflex. He denies fever and chills.   2. CHRONIC HYPERTENSION  Disease Monitoring  Blood pressure range: not checking   Chest pain: no   Dyspnea: no   Claudication: no   Medication compliance: yes  Medication Side Effects  Lightheadedness: no   Urinary frequency: no   Edema: yes, LLE some times     Preventitive Healthcare:  Exercise: no     3. CHRONIC DIABETES  Disease Monitoring  Blood Sugar Ranges: not checking   Polyuria: yes   Visual problems: no   Medication Compliance: no  Medication Side Effects  Hypoglycemia: no   Preventitive Health Care  Eye Exam: due   Foot Exam: done today   Diet pattern: no restrictions   Exercise: no   Social History  Substance Use Topics  . Smoking status: Never Smoker   . Smokeless tobacco: Never Used  . Alcohol Use: No    Outpatient Prescriptions Prior to Visit  Medication Sig Dispense Refill  . cephALEXin (KEFLEX) 500 MG capsule Take 2 capsules (1,000 mg total) by mouth 2 (two) times daily. 56 capsule 0  . lisinopril (PRINIVIL,ZESTRIL) 10 MG tablet Take 1 tablet (10 mg total) by mouth daily. 30 tablet 5  . tamsulosin (FLOMAX) 0.4 MG CAPS capsule Take 1 capsule (0.4 mg total) by mouth daily. 30 capsule 0  . glucose blood test strip Use as instructed (Patient not taking: Reported on 08/10/2015) 100 each 12  . HYDROcodone-acetaminophen (NORCO/VICODIN) 5-325 MG per tablet Take 1-2 tablets by mouth every 4 (four) hours as needed. (Patient not taking:  Reported on 07/31/2015) 12 tablet 0  . ibuprofen (ADVIL,MOTRIN) 600 MG tablet Take 1 tablet (600 mg total) by mouth every 8 (eight) hours as needed. (Patient not taking: Reported on 08/23/2014) 30 tablet 1  . metFORMIN (GLUCOPHAGE) 500 MG tablet Take 1 tablet (500 mg total) by mouth 2 (two) times daily with a meal. (Patient not taking: Reported on 08/10/2015) 180 tablet 3  . polyethylene glycol (MIRALAX) packet Take 17 g by mouth daily. (Patient not taking: Reported on 08/10/2015) 3 each 0  . potassium chloride SA (K-DUR,KLOR-CON) 20 MEQ tablet Take 2 tablets (40 mEq total) by mouth daily. (Patient not taking: Reported on 08/23/2014) 2 tablet 0  . terbinafine (LAMISIL) 250 MG tablet Take 1 tablet (250 mg total) by mouth daily. (Patient not taking: Reported on 08/23/2014) 30 tablet 3  . vitamin B-12 (CYANOCOBALAMIN) 1000 MCG tablet Take 1 tablet (1,000 mcg total) by mouth daily. (Patient not taking: Reported on 07/31/2015) 30 tablet 3   No facility-administered medications prior to visit.    ROS Review of Systems  Constitutional: Positive for fatigue. Negative for fever, chills and unexpected weight change.  Eyes: Negative for visual disturbance.  Respiratory: Negative for cough and shortness of breath.   Cardiovascular: Negative for chest pain, palpitations and leg swelling.  Gastrointestinal: Positive for constipation. Negative for nausea, vomiting, abdominal pain, diarrhea, blood in stool and abdominal distention.  Endocrine: Negative for polydipsia,  polyphagia and polyuria.  Genitourinary: Positive for dysuria, frequency, decreased urine volume and difficulty urinating. Negative for urgency and hematuria.       Hesitancy  Musculoskeletal: Negative for myalgias, back pain, arthralgias, gait problem and neck pain.  Skin: Negative for rash.  Allergic/Immunologic: Negative for immunocompromised state.  Hematological: Negative for adenopathy. Does not bruise/bleed easily.  Psychiatric/Behavioral:  Negative for suicidal ideas, sleep disturbance and dysphoric mood. The patient is not nervous/anxious.     Objective:  BP 162/85 mmHg  Pulse 77  Temp(Src) 98 F (36.7 C) (Oral)  Resp 16  Wt 213 lb (96.616 kg)  SpO2 98%  BP/Weight 08/10/2015 07/31/2015 08/23/2014  Systolic BP 162 129 101  Diastolic BP 85 82 59  Wt. (Lbs) 213 - -  BMI 35.44 - -   Physical Exam  Constitutional: He appears well-developed and well-nourished. No distress.  HENT:  Head: Normocephalic and atraumatic.  Neck: Normal range of motion. Neck supple.  Cardiovascular: Normal rate, regular rhythm, normal heart sounds and intact distal pulses.   Pulmonary/Chest: Effort normal and breath sounds normal.  Abdominal: Soft. Bowel sounds are normal. He exhibits no distension and no mass. There is no tenderness. There is no rebound and no guarding.  Genitourinary: Rectum normal. Guaiac negative stool. Prostate is enlarged and tender (slightly tender ).  Musculoskeletal: He exhibits no edema.  Neurological: He is alert.  Skin: Skin is warm and dry. No rash noted. No erythema.  Psychiatric: He has a normal mood and affect.   Lab Results  Component Value Date   HGBA1C 6.20 08/07/2014    Assessment & Plan:   Louis Peterson was seen today for prostatitis and diabetes.  Diagnoses and all orders for this visit:  Acute prostatitis -     Urine culture -     POCT urinalysis dipstick -     Ambulatory referral to Urology  Diabetes mellitus type 2 in obese (HCC) -     HgB A1c -     Glucose (CBG) -     COMPLETE METABOLIC PANEL WITH GFR -     Lipid Panel -     Ambulatory referral to Ophthalmology  Essential hypertension, benign -     lisinopril (PRINIVIL,ZESTRIL) 20 MG tablet; Take 1 tablet (20 mg total) by mouth daily.  Onychomycosis -     terbinafine (LAMISIL) 250 MG tablet; Take 1 tablet (250 mg total) by mouth daily. For 12 weeks -     Ambulatory referral to Podiatry  Other specified diabetes mellitus without  complications (HCC) -     metFORMIN (GLUCOPHAGE) 500 MG tablet; Take 1 tablet (500 mg total) by mouth 2 (two) times daily with a meal.  BPH (benign prostatic hyperplasia) -     tamsulosin (FLOMAX) 0.4 MG CAPS capsule; Take 2 capsules (0.8 mg total) by mouth daily.    Meds ordered this encounter  Medications  . lisinopril (PRINIVIL,ZESTRIL) 20 MG tablet    Sig: Take 1 tablet (20 mg total) by mouth daily.    Dispense:  30 tablet    Refill:  5  . tamsulosin (FLOMAX) 0.4 MG CAPS capsule    Sig: Take 2 capsules (0.8 mg total) by mouth daily.    Dispense:  60 capsule    Refill:  5  . terbinafine (LAMISIL) 250 MG tablet    Sig: Take 1 tablet (250 mg total) by mouth daily. For 12 weeks    Dispense:  90 tablet    Refill:  0  . metFORMIN (  GLUCOPHAGE) 500 MG tablet    Sig: Take 1 tablet (500 mg total) by mouth 2 (two) times daily with a meal.    Dispense:  60 tablet    Refill:  5    Follow-up: No Follow-up on file.   Dessa Phi MD

## 2015-08-10 NOTE — Assessment & Plan Note (Signed)
A: uncontrolled HTN on lisinopril 10 mg daily  P: Increase lisinopril to 20 mg daily CMP

## 2015-08-10 NOTE — Addendum Note (Signed)
Addended by: Dessa PhiFUNCHES, Kathy Wahid on: 08/10/2015 02:16 PM   Modules accepted: Orders

## 2015-08-10 NOTE — Assessment & Plan Note (Signed)
A: diabetes without treatment P: Restart metformin  CMP Lipids Close follow up for A1c

## 2015-08-10 NOTE — Progress Notes (Signed)
F/U prostate problems, DM  Sated unable to urinate, burning with urination  Abdominal pain, constipation  No medication x 8 days  Pain scale #  No tobacco user  No suicidal thoughts in the past two weeks

## 2015-08-10 NOTE — Assessment & Plan Note (Signed)
A: enlarged prostate with elevated PSA in 2015 P: Increase flomax to 0.8 mg daily  Urology referral

## 2015-08-10 NOTE — Patient Instructions (Addendum)
Louis Peterson was seen today for prostatitis and diabetes.  Diagnoses and all orders for this visit:  Acute prostatitis -     Urine culture -     POCT urinalysis dipstick -     Ambulatory referral to Urology  Diabetes mellitus type 2 in obese (HCC) -     HgB A1c -     Glucose (CBG) -     COMPLETE METABOLIC PANEL WITH GFR -     Lipid Panel -     metFORMIN (GLUCOPHAGE) 500 MG tablet; Take 1 tablet (500 mg total) by mouth 2 (two) times daily with a meal. -     Ambulatory referral to Ophthalmology  Essential hypertension, benign -     lisinopril (PRINIVIL,ZESTRIL) 20 MG tablet; Take 1 tablet (20 mg total) by mouth daily.  Onychomycosis -     terbinafine (LAMISIL) 250 MG tablet; Take 1 tablet (250 mg total) by mouth daily. For 12 weeks -     Ambulatory referral to Podiatry  BPH (benign prostatic hyperplasia) -     tamsulosin (FLOMAX) 0.4 MG CAPS capsule; Take 2 capsules (0.8 mg total) by mouth daily.  Chronic fatigue -     CBC -     Vitamin B12 -     TSH    F/u in 4 weeks for HTN and diabetes   Dr. Armen PickupFunches

## 2015-08-12 LAB — URINE CULTURE
Colony Count: NO GROWTH
Organism ID, Bacteria: NO GROWTH

## 2015-08-15 ENCOUNTER — Telehealth: Payer: Self-pay | Admitting: *Deleted

## 2015-08-15 NOTE — Telephone Encounter (Signed)
-----   Message from Dessa PhiJosalyn Funches, MD sent at 08/13/2015  9:08 AM EDT ----- All labs normal  Negative urine culture  Continue current treatment plan

## 2015-08-15 NOTE — Telephone Encounter (Signed)
Date of birth verified by pt  Normal results given  Continue taking medication as prescribed  Pt verbalized understanding  Information given in spanish

## 2015-08-28 ENCOUNTER — Ambulatory Visit: Payer: Medicare Other | Admitting: Podiatry

## 2015-08-28 DIAGNOSIS — H4423 Degenerative myopia, bilateral: Secondary | ICD-10-CM | POA: Diagnosis not present

## 2015-08-28 DIAGNOSIS — H11043 Peripheral pterygium, stationary, bilateral: Secondary | ICD-10-CM | POA: Diagnosis not present

## 2015-08-28 DIAGNOSIS — H15833 Staphyloma posticum, bilateral: Secondary | ICD-10-CM | POA: Diagnosis not present

## 2015-08-28 DIAGNOSIS — H2513 Age-related nuclear cataract, bilateral: Secondary | ICD-10-CM | POA: Diagnosis not present

## 2015-08-28 DIAGNOSIS — E119 Type 2 diabetes mellitus without complications: Secondary | ICD-10-CM | POA: Diagnosis not present

## 2015-08-29 ENCOUNTER — Ambulatory Visit (INDEPENDENT_AMBULATORY_CARE_PROVIDER_SITE_OTHER): Payer: Medicare Other | Admitting: Podiatry

## 2015-08-29 ENCOUNTER — Encounter: Payer: Self-pay | Admitting: Podiatry

## 2015-08-29 VITALS — BP 103/66 | HR 72 | Resp 12

## 2015-08-29 DIAGNOSIS — M79674 Pain in right toe(s): Secondary | ICD-10-CM | POA: Diagnosis not present

## 2015-08-29 DIAGNOSIS — M79675 Pain in left toe(s): Secondary | ICD-10-CM | POA: Diagnosis not present

## 2015-08-29 DIAGNOSIS — B351 Tinea unguium: Secondary | ICD-10-CM

## 2015-08-29 NOTE — Patient Instructions (Signed)
Diabetes y cuidados del pie (Diabetes and Foot Care) La diabetes puede ser la causa de que el flujo sanguneo (circulacin) en las piernas y los pies sea deficiente. Debido a esto, la piel de los pies se torna ms delgada, se rompe con facilidad y se cura ms lentamente. La piel puede estar seca, despellejarse y agrietarse. Tambin pueden estar daados los nervios de las piernas y de los pies lo que provoca una disminucin de la sensibilidad. Es posible que no advierta heridas ms pequeas en los pies, que pueden causar infecciones graves. Cuidar sus pies es una de las cosas ms importantes que puede hacer por usted mismo.  INSTRUCCIONES PARA EL CUIDADO EN EL HOGAR  Use siempre calzado, an dentro de su casa. No camine descalzo. Caminar descalzo facilita que se lastime.  Controle sus pies diariamente para observar ampollas, cortes y enrojecimiento. Si no puede ver la planta del pie, use un espejo o pdale ayuda a otra persona.  Lave sus pies con agua tibia (no use agua caliente) y un jabn suave. Seque bien sus pies, y la zona entre los dedos dando palmaditas, hasta que estn completamente secos. Noremoje los pies, ya que esto puede resecar la piel.  Aplique una locin hidratante o vaselina (que no contenga alcohol ni perfume) en los pies y en las uas secas y quebradizas. No aplique locin entre los dedos.  Recorte las uas en forma recta. No escarbe debajo de las uas o alrededor de las cutculas. Lime los bordes de las uas con una lima o esmeril.  No corte las durezas o callosidades, ni trate de quitarlas con medicamentos.  Use calcetines de algodn o medias limpias todos los das. Asegrese de que no le ajusten demasiado. Nouse calcetines que le lleguen a las rodillas, ya que podran disminuir el flujo de sangre a las piernas.  Use zapatos de cuero que le queden bien y que sean acolchados. Para amoldar los zapatos, clcelos slo algunas horas por da. Esto evitar lesiones en los pies. Revise  siempre los zapatos antes de ponerlos para asegurarse de que no haya objetos en su interior.  No cruce las piernas. Esto puede disminuir el flujo de sangre a los pies.  Si algo le ha raspado, cortado o lastimado la piel de los pies, mantenga la piel de esa zona limpia y seca. Debe higienizar estas zonas con agua y un jabn suave. No limpie la zona con agua oxigenada, alcohol ni yodo.  Cuando se quite un vendaje adhesivo, asegrese de no daar la piel.  Si tiene una herida, obsrvela varias veces por da para asegurarse de que se est curando.  No use bolsas de agua caliente ni almohadillas trmicas. Podran causar quemaduras. Si ha perdido la sensibilidad en los pies o las piernas, no sabr lo que le est sucediendo hasta que sea demasiado tarde.  Asegrese de que su mdico le haga un examen completo de los pies por lo menos una vez al ao, o con ms frecuencia si usted tiene problemas en los pies. Informe todos los cortes, llagas o moretones a su mdico inmediatamente. SOLICITE ATENCIN MDICA SI:   Tiene una lesin que no se cura.  Tiene cortes o rajaduras en la piel.  Tiene una ua encarnada.  Nota una zona irritada en las piernas o los pies.  Siente una sensacin de ardor u hormigueo en las piernas o los pies.  Siente dolor o calambres en las piernas o los pies.  Las piernas o los pies estn adormecidos.    Siente los pies siempre fros. SOLICITE ATENCIN MDICA DE INMEDIATO SI:   Presenta enrojecimiento, hinchazn o aumento del dolor en una herida.  Nota una lnea roja que sube por pierna.  Aparece pus en la herida.  Le sube la fiebre o segn lo que le indique el mdico.  Advierte un olor ftido que proviene de una lcera o una herida.   Esta informacin no tiene como fin reemplazar el consejo del mdico. Asegrese de hacerle al mdico cualquier pregunta que tenga.   Document Released: 04/21/2005 Document Revised: 12/22/2012 Elsevier Interactive Patient Education 2016  Elsevier Inc.  

## 2015-08-29 NOTE — Progress Notes (Signed)
   Subjective:    Patient ID: Louis Peterson, male    DOB: 08/21/1945, 70 y.o.   MRN: [id number]  HPI This patient presents today complaining of uncomfortable toenails walking wearing shoes and requests toenail debridement. The last visit for a similar service was on 03/16/2014. Patient's daughter is in treatment room today who interprets for her father who has limited English speaking skills. Patient is a diabetic without a history of foot ulceration, amputation or claudication    Review of Systems  Cardiovascular: Positive for leg swelling.  Skin: Positive for color change.       Objective:   Physical Exam  Patient is able to answer it questions with daughter interpreting for him in Spanish  Vascular: DPs 1/4 bilaterally PT pulses 2/4 bilaterally Capillary reflex immediate bilaterally  Neurological: Sensation to 10 g monofilament wire intact 5/5 bilaterally Vibratory sensation reactive bilaterally Ankle reflex equal and reactive bilaterally  Dermatological: No open skin lesions bilaterally Hyperpigmentation lower leg ankle left The toenails are elongated, brittle, deformed, hypertrophic and tender to direct palpation 6-10  Musculoskeletal: There is no restriction of range of motion or crepitus in ankle, subtalar, midtarsal joints bilaterally      Assessment & Plan:   Assessment: Satisfactory neurovascular status Diabetic without foot complications Neglected symptomatic onychomycoses 6-10  Plan: Debridement toenails 6-10 mechanically and electronically without any bleeding  Reappoint at patient's request or yearly

## 2015-09-07 ENCOUNTER — Encounter: Payer: Self-pay | Admitting: Family Medicine

## 2015-09-07 ENCOUNTER — Ambulatory Visit: Payer: Medicare Other | Attending: Family Medicine | Admitting: Family Medicine

## 2015-09-07 VITALS — BP 135/80 | HR 87 | Temp 97.5°F | Resp 16 | Ht 64.0 in | Wt 219.0 lb

## 2015-09-07 DIAGNOSIS — E669 Obesity, unspecified: Secondary | ICD-10-CM

## 2015-09-07 DIAGNOSIS — E119 Type 2 diabetes mellitus without complications: Secondary | ICD-10-CM | POA: Diagnosis not present

## 2015-09-07 DIAGNOSIS — Z6837 Body mass index (BMI) 37.0-37.9, adult: Secondary | ICD-10-CM | POA: Diagnosis not present

## 2015-09-07 DIAGNOSIS — E1169 Type 2 diabetes mellitus with other specified complication: Secondary | ICD-10-CM

## 2015-09-07 DIAGNOSIS — I10 Essential (primary) hypertension: Secondary | ICD-10-CM | POA: Diagnosis not present

## 2015-09-07 DIAGNOSIS — Z79899 Other long term (current) drug therapy: Secondary | ICD-10-CM | POA: Diagnosis not present

## 2015-09-07 DIAGNOSIS — N400 Enlarged prostate without lower urinary tract symptoms: Secondary | ICD-10-CM

## 2015-09-07 DIAGNOSIS — Z7984 Long term (current) use of oral hypoglycemic drugs: Secondary | ICD-10-CM | POA: Insufficient documentation

## 2015-09-07 DIAGNOSIS — I8312 Varicose veins of left lower extremity with inflammation: Secondary | ICD-10-CM

## 2015-09-07 DIAGNOSIS — K432 Incisional hernia without obstruction or gangrene: Secondary | ICD-10-CM | POA: Diagnosis not present

## 2015-09-07 DIAGNOSIS — K5901 Slow transit constipation: Secondary | ICD-10-CM | POA: Diagnosis not present

## 2015-09-07 DIAGNOSIS — I872 Venous insufficiency (chronic) (peripheral): Secondary | ICD-10-CM | POA: Insufficient documentation

## 2015-09-07 LAB — POCT GLYCOSYLATED HEMOGLOBIN (HGB A1C): Hemoglobin A1C: 6.1

## 2015-09-07 LAB — GLUCOSE, POCT (MANUAL RESULT ENTRY): POC GLUCOSE: 137 mg/dL — AB (ref 70–99)

## 2015-09-07 MED ORDER — POLYETHYLENE GLYCOL 3350 17 GM/SCOOP PO POWD
17.0000 g | Freq: Every day | ORAL | Status: DC
Start: 2015-09-07 — End: 2016-05-13

## 2015-09-07 MED ORDER — FINASTERIDE 5 MG PO TABS
5.0000 mg | ORAL_TABLET | Freq: Every day | ORAL | Status: DC
Start: 2015-09-07 — End: 2015-11-30

## 2015-09-07 MED ORDER — DOCUSATE SODIUM 100 MG PO CAPS: 100.0000 mg | ORAL_CAPSULE | Freq: Every day | ORAL | Status: DC | PRN

## 2015-09-07 MED FILL — FINASTERIDE 5 MG TABLET: 5 | 30 days supply | Qty: 30 | Fill #0

## 2015-09-07 MED FILL — POLYETHYLENE GLYCOL 3350: 30 days supply | Qty: 510 | Fill #0

## 2015-09-07 NOTE — Progress Notes (Signed)
F/U HTN DM  Taking medication as prescribed  Medication refills, BP med, strips  No pain today  No tobacco user  No suicidal thoughts in the past two weeks

## 2015-09-07 NOTE — Patient Instructions (Addendum)
Louis Peterson was seen today for diabetes and hypertension.  Diagnoses and all orders for this visit:  Diabetes mellitus type 2 in obese (HCC) -     POCT glycosylated hemoglobin (Hb A1C) -     POCT glucose (manual entry)  BPH (benign prostatic hyperplasia) -     finasteride (PROSCAR) 5 MG tablet; Take 1 tablet (5 mg total) by mouth daily.  Slow transit constipation -     polyethylene glycol powder (GLYCOLAX/MIRALAX) powder; Take 17 g by mouth daily. -     docusate sodium (COLACE) 100 MG capsule; Take 1 capsule (100 mg total) by mouth daily as needed for mild constipation.  Incisional hernia, without obstruction or gangrene  Venous stasis dermatitis of left lower extremity  Essential hypertension, benign   Noted abdominal hernia   F/u in 3 months for diabetes and HTN   Dr. Armen PickupFunches   Hernia - Adultos (Hernia, Adult) Una hernia ocurre cuando un rgano o un tejido interno se protruye a travs de un punto debilitado del vientre (abdomen). CUIDADOS EN EL HOGAR  No estire ni use en exceso (sobrecargue) los msculos que estn cerca de la hernia.  No levante ningn objeto que pese ms de 10libras (4,5kg).  Para levantar objetos, use los msculos de las piernas. No use los msculos de la espalda.  Cuando tosa, hgalo con suavidad.  Consuma una dieta con alto contenido de [place]ra. Coma gran cantidad de frutas y verduras.  Beba suficiente lquido para mantener el pis (orina) claro o de color amarillo plido. Trate de beber 6 u 8vasos de Warehouse manageragua por da.  Tome medicamentos para ablandar la materia fecal (ablandadores de heces) como se lo haya indicado el mdico.  Baje de [place]peso, si tiene sobrepeso.  No consuma ningn producto que contenga tabaco, lo que incluye cigarrillos, tabaco de Theatre managermascar o Administrator, Civil Servicecigarrillos electrnicos. Si necesita ayuda para dejar de fumar, consulte al mdico.  Concurra a todas las visitas de control como se lo haya indicado el mdico. Esto es importante. SOLICITE AYUDA  SI:  La piel que rodea la hernia se inflama (hincha) o se enrojece.  Le duele la hernia. SOLICITE AYUDA DE INMEDIATO SI:  Tiene fiebre.  Siente dolor abdominal que empeora.  Tiene malestar estomacal (nuseas) o vomita.  No puede volver a Electrical engineercolocar la hernia en su lugar al ejercer sobre esta una presin suave mientras est acostado.  La hernia:  Cambia de forma o de tamao.  Se le atasca fuera del vientre.  Cambia de color.  Est dura al tacto o le causa dolor a la palpacin.   Esta informacin no tiene Theme park managercomo fin reemplazar el consejo del mdico. Asegrese de hacerle al mdico cualquier pregunta que tenga.   Document Released: 02/09/2013 Document Revised: 05/12/2014 Elsevier Interactive Patient Education [employer]2016 Elsevier Inc.
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
## 2015-09-07 NOTE — Assessment & Plan Note (Signed)
Well controlled Continue current regimen Consider change to HCTZ if venous stasis worsens

## 2015-09-07 NOTE — Assessment & Plan Note (Signed)
Controlled diabetes Foot exam done today Continue current regimen

## 2015-09-07 NOTE — Assessment & Plan Note (Signed)
Persistent obstructive symptoms Add proscar Patient to keep urology appt

## 2015-09-12 DIAGNOSIS — Z0000 Encounter for general adult medical examination without abnormal findings: Secondary | ICD-10-CM | POA: Diagnosis not present

## 2015-09-12 DIAGNOSIS — R3912 Poor urinary stream: Secondary | ICD-10-CM | POA: Diagnosis not present

## 2015-09-12 DIAGNOSIS — R972 Elevated prostate specific antigen [PSA]: Secondary | ICD-10-CM | POA: Diagnosis not present

## 2015-09-12 DIAGNOSIS — R351 Nocturia: Secondary | ICD-10-CM | POA: Diagnosis not present

## 2015-09-12 DIAGNOSIS — R350 Frequency of micturition: Secondary | ICD-10-CM | POA: Diagnosis not present

## 2015-09-12 DIAGNOSIS — N401 Enlarged prostate with lower urinary tract symptoms: Secondary | ICD-10-CM | POA: Diagnosis not present

## 2015-10-04 MED FILL — FINASTERIDE 5 MG TABLET: 5 | 30 days supply | Qty: 30 | Fill #1

## 2015-10-24 DIAGNOSIS — R972 Elevated prostate specific antigen [PSA]: Secondary | ICD-10-CM | POA: Diagnosis not present

## 2015-11-05 MED FILL — FINASTERIDE 5 MG TABLET: 5 | 30 days supply | Qty: 30 | Fill #2

## 2015-11-30 ENCOUNTER — Ambulatory Visit: Payer: Medicare Other | Attending: Family Medicine | Admitting: Family Medicine

## 2015-11-30 ENCOUNTER — Encounter: Payer: Self-pay | Admitting: Family Medicine

## 2015-11-30 VITALS — BP 135/73 | HR 80 | Temp 98.1°F | Resp 16 | Ht 65.0 in | Wt 220.0 lb

## 2015-11-30 DIAGNOSIS — E119 Type 2 diabetes mellitus without complications: Secondary | ICD-10-CM

## 2015-11-30 DIAGNOSIS — E669 Obesity, unspecified: Secondary | ICD-10-CM

## 2015-11-30 DIAGNOSIS — N400 Enlarged prostate without lower urinary tract symptoms: Secondary | ICD-10-CM | POA: Diagnosis not present

## 2015-11-30 DIAGNOSIS — I10 Essential (primary) hypertension: Secondary | ICD-10-CM | POA: Diagnosis not present

## 2015-11-30 DIAGNOSIS — E1169 Type 2 diabetes mellitus with other specified complication: Secondary | ICD-10-CM

## 2015-11-30 LAB — POCT GLYCOSYLATED HEMOGLOBIN (HGB A1C): HEMOGLOBIN A1C: 5.6

## 2015-11-30 LAB — GLUCOSE, POCT (MANUAL RESULT ENTRY): POC GLUCOSE: 195 mg/dL — AB (ref 70–99)

## 2015-11-30 MED ORDER — LISINOPRIL 20 MG PO TABS: 20.0000 mg | ORAL_TABLET | Freq: Every day | ORAL | 3 refills | Status: DC

## 2015-11-30 MED ORDER — FINASTERIDE 5 MG PO TABS: 5.0000 mg | ORAL_TABLET | Freq: Every day | ORAL | 3 refills | Status: DC

## 2015-11-30 MED ORDER — METFORMIN HCL 500 MG PO TABS: 500.0000 mg | ORAL_TABLET | Freq: Two times a day (BID) | ORAL | 3 refills | Status: DC

## 2015-11-30 MED ORDER — TAMSULOSIN HCL 0.4 MG PO CAPS: 0.8000 mg | ORAL_CAPSULE | Freq: Every day | ORAL | 3 refills | Status: DC
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
## 2015-11-30 NOTE — Patient Instructions (Addendum)
Louis Peterson was seen today for diabetes.  Diagnoses and all orders for this visit:  Controlled type 2 diabetes mellitus without complication, without long-term current use of insulin (HCC) -     POCT glycosylated hemoglobin (Hb A1C) -     POCT glucose (manual entry)  BPH (benign prostatic hyperplasia) -     tamsulosin (FLOMAX) 0.4 MG CAPS capsule; Take 2 capsules (0.8 mg total) by mouth daily. -     finasteride (PROSCAR) 5 MG tablet; Take 1 tablet (5 mg total) by mouth daily.  Diabetes mellitus type 2 in obese (HCC) -     metFORMIN (GLUCOPHAGE) 500 MG tablet; Take 1 tablet (500 mg total) by mouth 2 (two) times daily with a meal.  Essential hypertension, benign -     lisinopril (PRINIVIL,ZESTRIL) 20 MG tablet; Take 1 tablet (20 mg total) by mouth daily.   F/u for flu shot when you return from [place]  F/u in 6 months for HTN and diabetes office visit   Dr. Armen Pickup

## 2015-11-30 NOTE — Progress Notes (Signed)
F/U DM  Glucose running 87-97 Taking medication as prescribed  No tobacco user  No suicidal thought in the past two weeks  [place] Requesting medication refills

## 2015-12-02 DIAGNOSIS — Z23 Encounter for immunization: Secondary | ICD-10-CM

## 2015-12-02 NOTE — Assessment & Plan Note (Signed)
Controlled. Continue current regimen. 

## 2015-12-05 MED FILL — metFORMIN HCL 500 MG TABS: 500 | 90 days supply | Qty: 180 | Fill #0

## 2015-12-05 MED FILL — FINASTERIDE 5 MG TABLET: 5 | 90 days supply | Qty: 90 | Fill #0

## 2015-12-05 MED FILL — LISINOPRIL 20 MG TABLET: 20 | 90 days supply | Qty: 90 | Fill #0

## 2015-12-17 MED FILL — TAMSULOSIN HCL 0.4 MG CAP: 0.4 | 45 days supply | Qty: 90 | Fill #0
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
## 2016-02-15 MED FILL — TAMSULOSIN HCL 0.4 MG CAP: 0.4 | 90 days supply | Qty: 180 | Fill #1

## 2016-03-05 DIAGNOSIS — R972 Elevated prostate specific antigen [PSA]: Secondary | ICD-10-CM | POA: Diagnosis not present

## 2016-03-05 MED FILL — FINASTERIDE 5 MG TABLET: 5 | 90 days supply | Qty: 90 | Fill #1

## 2016-03-05 MED FILL — metFORMIN HCL 500 MG TABS: 500 | 90 days supply | Qty: 180 | Fill #1

## 2016-03-05 MED FILL — LISINOPRIL 20 MG TABLET: 20 | 90 days supply | Qty: 90 | Fill #1

## 2016-03-12 ENCOUNTER — Other Ambulatory Visit: Payer: Self-pay | Admitting: Urology

## 2016-03-12 DIAGNOSIS — R972 Elevated prostate specific antigen [PSA]: Secondary | ICD-10-CM | POA: Diagnosis not present

## 2016-03-12 DIAGNOSIS — R3912 Poor urinary stream: Secondary | ICD-10-CM | POA: Diagnosis not present

## 2016-03-12 DIAGNOSIS — N401 Enlarged prostate with lower urinary tract symptoms: Secondary | ICD-10-CM | POA: Diagnosis not present

## 2016-03-17 ENCOUNTER — Other Ambulatory Visit: Payer: Self-pay | Admitting: Urology

## 2016-03-17 DIAGNOSIS — R972 Elevated prostate specific antigen [PSA]: Secondary | ICD-10-CM

## 2016-04-01 ENCOUNTER — Ambulatory Visit
Admission: RE | Admit: 2016-04-01 | Discharge: 2016-04-01 | Disposition: A | Discharge status: Home / Self Care | Payer: Medicare Other | Source: Ambulatory Visit | Attending: Urology | Admitting: Urology

## 2016-04-01 DIAGNOSIS — R972 Elevated prostate specific antigen [PSA]: Secondary | ICD-10-CM

## 2016-04-01 DIAGNOSIS — K5790 Diverticulosis of intestine, part unspecified, without perforation or abscess without bleeding: Secondary | ICD-10-CM | POA: Diagnosis not present

## 2016-04-01 DIAGNOSIS — Z01818 Encounter for other preprocedural examination: Secondary | ICD-10-CM | POA: Diagnosis not present

## 2016-04-01 MED ORDER — GADOBENATE DIMEGLUMINE 529 MG/ML IV SOLN
20.0000 mL | Freq: Once | INTRAVENOUS | Status: AC | PRN
  Administered 2016-04-01: 20 mL via INTRAVENOUS

## 2016-05-13 ENCOUNTER — Encounter: Payer: Self-pay | Admitting: Family Medicine

## 2016-05-13 ENCOUNTER — Ambulatory Visit: Payer: Medicare Other | Attending: Family Medicine | Admitting: Family Medicine

## 2016-05-13 VITALS — BP 129/77 | HR 93 | Temp 98.1°F | Ht 65.0 in | Wt 223.6 lb

## 2016-05-13 DIAGNOSIS — Z7984 Long term (current) use of oral hypoglycemic drugs: Secondary | ICD-10-CM | POA: Insufficient documentation

## 2016-05-13 DIAGNOSIS — Z79899 Other long term (current) drug therapy: Secondary | ICD-10-CM | POA: Insufficient documentation

## 2016-05-13 DIAGNOSIS — Z23 Encounter for immunization: Secondary | ICD-10-CM | POA: Diagnosis not present

## 2016-05-13 DIAGNOSIS — E669 Obesity, unspecified: Secondary | ICD-10-CM | POA: Insufficient documentation

## 2016-05-13 DIAGNOSIS — J0110 Acute frontal sinusitis, unspecified: Secondary | ICD-10-CM | POA: Insufficient documentation

## 2016-05-13 DIAGNOSIS — N400 Enlarged prostate without lower urinary tract symptoms: Secondary | ICD-10-CM | POA: Diagnosis not present

## 2016-05-13 DIAGNOSIS — I10 Essential (primary) hypertension: Secondary | ICD-10-CM | POA: Insufficient documentation

## 2016-05-13 DIAGNOSIS — I872 Venous insufficiency (chronic) (peripheral): Secondary | ICD-10-CM

## 2016-05-13 DIAGNOSIS — E119 Type 2 diabetes mellitus without complications: Secondary | ICD-10-CM | POA: Diagnosis not present

## 2016-05-13 DIAGNOSIS — E1169 Type 2 diabetes mellitus with other specified complication: Secondary | ICD-10-CM

## 2016-05-13 DIAGNOSIS — Z0001 Encounter for general adult medical examination with abnormal findings: Secondary | ICD-10-CM | POA: Diagnosis not present

## 2016-05-13 DIAGNOSIS — K5901 Slow transit constipation: Secondary | ICD-10-CM | POA: Insufficient documentation

## 2016-05-13 LAB — POCT GLYCOSYLATED HEMOGLOBIN (HGB A1C): Hemoglobin A1C: 5.8

## 2016-05-13 LAB — GLUCOSE, POCT (MANUAL RESULT ENTRY): POC Glucose: 125 mg/dl — AB (ref 70–99)

## 2016-05-13 MED ORDER — POLYETHYLENE GLYCOL 3350 17 GM/SCOOP PO POWD: 17.0000 g | Freq: Every day | ORAL | 5 refills | Status: DC

## 2016-05-13 MED ORDER — AMOXICILLIN 500 MG PO CAPS: 500.0000 mg | ORAL_CAPSULE | Freq: Three times a day (TID) | ORAL | 0 refills | Status: DC

## 2016-05-13 MED ORDER — DOCUSATE SODIUM 100 MG PO CAPS: 100.0000 mg | ORAL_CAPSULE | Freq: Every day | ORAL | 5 refills | Status: DC | PRN

## 2016-05-13 MED ORDER — METFORMIN HCL 500 MG PO TABS: 500.0000 mg | ORAL_TABLET | Freq: Two times a day (BID) | ORAL | 11 refills | Status: DC

## 2016-05-13 MED ORDER — MEDICAL COMPRESSION SOCKS MISC: 1.0000 | Freq: Every day | 0 refills | Status: AC

## 2016-05-13 MED ORDER — TAMSULOSIN HCL 0.4 MG PO CAPS: 0.8000 mg | ORAL_CAPSULE | Freq: Every day | ORAL | 11 refills | Status: DC

## 2016-05-13 MED ORDER — FINASTERIDE 5 MG PO TABS: 5.0000 mg | ORAL_TABLET | Freq: Every day | ORAL | 11 refills | Status: DC

## 2016-05-13 MED ORDER — LISINOPRIL 20 MG PO TABS: 20.0000 mg | ORAL_TABLET | Freq: Every day | ORAL | 11 refills | Status: DC

## 2016-05-13 MED FILL — AMOXICILLIN 500 MG CAPSULE: 500 | 10 days supply | Qty: 30 | Fill #0

## 2016-05-13 MED FILL — TAMSULOSIN HCL 0.4 MG CAP: 0.4 | 30 days supply | Qty: 60 | Fill #0

## 2016-05-13 MED FILL — POLYETHYLENE GLYCOL 3350 PO: 30 days supply | Qty: 527 | Fill #0

## 2016-05-13 NOTE — Assessment & Plan Note (Signed)
Viral sinusitis with secondary bacterial infection Course of amoxicillin
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
## 2016-05-13 NOTE — Progress Notes (Signed)
Pt is here today for diabetes. Pt has had cough for 2 weeks. Pt is having constipation. Pt needs refills on all medications.  Pt is getting flu shot today.

## 2016-05-13 NOTE — Assessment & Plan Note (Signed)
Add compression stockings

## 2016-05-13 NOTE — Patient Instructions (Addendum)
Sosaia was seen today for diabetes.  Diagnoses and all orders for this visit:  Diabetes mellitus type 2 in obese (HCC) -     POCT glucose (manual entry) -     POCT glycosylated hemoglobin (Hb A1C) -     metFORMIN (GLUCOPHAGE) 500 MG tablet; Take 1 tablet (500 mg total) by mouth 2 (two) times daily with a meal.  Benign prostatic hyperplasia without lower urinary tract symptoms -     tamsulosin (FLOMAX) 0.4 MG CAPS capsule; Take 2 capsules (0.8 mg total) by mouth daily. -     finasteride (PROSCAR) 5 MG tablet; Take 1 tablet (5 mg total) by mouth daily.  Essential hypertension, benign -     lisinopril (PRINIVIL,ZESTRIL) 20 MG tablet; Take 1 tablet (20 mg total) by mouth daily.  Slow transit constipation -     polyethylene glycol powder (GLYCOLAX/MIRALAX) powder; Take 17 g by mouth daily. -     docusate sodium (COLACE) 100 MG capsule; Take 1 capsule (100 mg total) by mouth daily as needed for mild constipation.   Try OTC biotene for dry mouth   F/u in 6 months for HTN and diabetes  Dr. Armen PickupFunches

## 2016-05-13 NOTE — Assessment & Plan Note (Signed)
Well controlled 

## 2016-05-13 NOTE — Assessment & Plan Note (Signed)
Well controlled continue current regimen.  

## 2016-05-23 DIAGNOSIS — R972 Elevated prostate specific antigen [PSA]: Secondary | ICD-10-CM | POA: Diagnosis not present

## 2016-05-23 DIAGNOSIS — N4289 Other specified disorders of prostate: Secondary | ICD-10-CM | POA: Diagnosis not present

## 2016-05-27 ENCOUNTER — Telehealth: Payer: Self-pay | Admitting: Family Medicine

## 2016-05-27 ENCOUNTER — Encounter: Payer: Self-pay | Admitting: Family Medicine

## 2016-05-27 DIAGNOSIS — N414 Granulomatous prostatitis: Secondary | ICD-10-CM

## 2016-05-27 MED FILL — FINASTERIDE 5 MG TABLET: 5 | 90 days supply | Qty: 90 | Fill #2

## 2016-05-27 MED FILL — LISINOPRIL 20 MG TABLET: 20 | 90 days supply | Qty: 90 | Fill #2

## 2016-05-27 MED FILL — metFORMIN HCL 500 MG TABS: 500 | 90 days supply | Qty: 180 | Fill #2

## 2016-05-27 NOTE — Telephone Encounter (Signed)
Dr. 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 Vevelyn RoyalsBorden's office left message to ask that acid fast smear and culture of prostate tissue be added if not already done  CXR ordered  Plan for ID referral if culture and smear positive  Please ask patient to come in for follow up CXR ideally done prior to appt

## 2016-05-28 NOTE — Telephone Encounter (Signed)
[facility] called with concerns about patient and patient is needing a TB test for possible signs of TB.  As mentioned patient is traveling to [place]. Patient daughter would like to know if traveling plans should be halted. Being that patient is needing chest X rays it was suggested that traveling plans be delayed.   Doctors note was read to nurse Morrie SheldonAshley at [facility]     Please follow up with Dava NajjarAshley J. [phone] Ext [phone]

## 2016-05-29 NOTE — Telephone Encounter (Signed)
Please call patient's daughter  Yes, patient advised to halt plans until follow up can be done to evaluate for tuberculosis.   Please complete ordered CXR, no appt needed, [facility] or [facility]

## 2016-05-30 ENCOUNTER — Ambulatory Visit (HOSPITAL_COMMUNITY)
Admission: RE | Admit: 2016-05-30 | Discharge: 2016-05-30 | Disposition: A | Discharge status: Home / Self Care | Payer: Medicare Other | Source: Ambulatory Visit | Attending: Family Medicine | Admitting: Family Medicine

## 2016-05-30 DIAGNOSIS — I517 Cardiomegaly: Secondary | ICD-10-CM | POA: Insufficient documentation

## 2016-05-30 DIAGNOSIS — N414 Granulomatous prostatitis: Secondary | ICD-10-CM | POA: Diagnosis not present

## 2016-06-02 NOTE — Telephone Encounter (Signed)
Chest x-ray without lesions to suggest TB Next step TB skin test (PPD), patient may wait for office visit or come in for nurse visit for TB skin test (PPD) Also need to rule out sarcoidosis with cause inflammatory nodules in lungs and other tissues

## 2016-06-03 ENCOUNTER — Telehealth: Payer: Self-pay | Admitting: *Deleted

## 2016-06-03 NOTE — Telephone Encounter (Signed)
Patient verified DOB Patient is aware of needing a TB skin test. Patient states he will receive the TB test at his FU visit with Dr. Armen PickupFunches on 06/09/16. No further questions at this time.

## 2016-06-03 NOTE — Telephone Encounter (Signed)
Medical Assistant used [employer] to contact patient.  Interpreter Name: Karna Christmasdrianna Interpreter #: [phone] Voicemail states to give a call back to [place] with [facility] at [phone].

## 2016-06-03 NOTE — Telephone Encounter (Signed)
-----   Message from Dessa PhiJosalyn Funches, MD sent at 06/02/2016  1:33 PM EST ----- Chest x-ray without lesions to suggest TB Next step TB skin test (PPD), patient may wait for office visit or come in for nurse visit for TB skin test (PPD) Also need to rule out sarcoidosis with cause inflammatory nodules in lungs and other tissues

## 2016-06-06 ENCOUNTER — Telehealth: Payer: Self-pay | Admitting: Family Medicine

## 2016-06-06 NOTE — Telephone Encounter (Signed)
Louis Peterson, from Dr. Vevelyn RoyalsBorden's office called to inform PCP that patient's (fungal pathology test) results may take a couple of weeks before final results are faxed to our office.   Thank you.

## 2016-06-09 ENCOUNTER — Ambulatory Visit: Payer: Medicare Other | Attending: Family Medicine | Admitting: Family Medicine

## 2016-06-09 ENCOUNTER — Encounter: Payer: Self-pay | Admitting: Family Medicine

## 2016-06-09 VITALS — BP 131/74 | HR 70 | Temp 98.3°F | Ht 65.0 in | Wt 225.0 lb

## 2016-06-09 DIAGNOSIS — E1169 Type 2 diabetes mellitus with other specified complication: Secondary | ICD-10-CM

## 2016-06-09 DIAGNOSIS — N419 Inflammatory disease of prostate, unspecified: Secondary | ICD-10-CM | POA: Diagnosis not present

## 2016-06-09 DIAGNOSIS — N414 Granulomatous prostatitis: Secondary | ICD-10-CM | POA: Diagnosis not present

## 2016-06-09 DIAGNOSIS — R0981 Nasal congestion: Secondary | ICD-10-CM | POA: Insufficient documentation

## 2016-06-09 DIAGNOSIS — E669 Obesity, unspecified: Secondary | ICD-10-CM | POA: Diagnosis not present

## 2016-06-09 DIAGNOSIS — Z79899 Other long term (current) drug therapy: Secondary | ICD-10-CM | POA: Insufficient documentation

## 2016-06-09 DIAGNOSIS — Z0000 Encounter for general adult medical examination without abnormal findings: Secondary | ICD-10-CM

## 2016-06-09 DIAGNOSIS — Z7984 Long term (current) use of oral hypoglycemic drugs: Secondary | ICD-10-CM | POA: Diagnosis not present

## 2016-06-09 DIAGNOSIS — E119 Type 2 diabetes mellitus without complications: Secondary | ICD-10-CM | POA: Insufficient documentation

## 2016-06-09 DIAGNOSIS — Z5189 Encounter for other specified aftercare: Secondary | ICD-10-CM | POA: Diagnosis not present

## 2016-06-09 LAB — GLUCOSE, POCT (MANUAL RESULT ENTRY): POC Glucose: 115 mg/dl — AB (ref 70–99)

## 2016-06-09 MED ORDER — FLUTICASONE PROPIONATE 50 MCG/ACT NA SUSP: 2.0000 | Freq: Every day | NASAL | 6 refills | Status: DC

## 2016-06-09 NOTE — Assessment & Plan Note (Signed)
Ruling out TB PPD placed Awaiting final pathology and culture results

## 2016-06-09 NOTE — Patient Instructions (Addendum)
Diagnoses and all orders for this visit:  Diabetes mellitus type 2 in obese (HCC) -     POCT glucose (manual entry)  Nasal congestion -     fluticasone (FLONASE) 50 MCG/ACT nasal spray; Place 2 sprays into both nostrils daily.  Granulomatous prostatitis   F/u in 2 days for PPD reading  Dr. Armen PickupFunches

## 2016-06-09 NOTE — Progress Notes (Signed)
Pt is here today for a PPD test. Pt is also having coughing and congestion.

## 2016-06-09 NOTE — Telephone Encounter (Signed)
Will route to PCP 

## 2016-06-09 NOTE — Telephone Encounter (Signed)
Noted thank you

## 2016-06-09 NOTE — Progress Notes (Addendum)
Subjective:  Patient ID: Louis Peterson, male    DOB: 25-Oct-1945  Age: 71 y.o. MRN: [id number]  CC: Follow-up   HPI Louis Peterson male  has diabetes, HTN, BPH he presents with his daughter-in-law  for    1. Rule out TB: he recently underwent prostate gland biopsy for elevated PSA. Biopsy results revealed benign prostatic tissue with  necrotizing giant cell granulomas in the Left lateral base, left apex, right mid, right mid lateral, right apex lateral, concerning for TB. He had f/u CXR which was negative for acute findings including cavitary lesions or nodules. He denies cough, fever, chills, weight loss. He denies previous TB or known exposure to TB. He canceled a trip to [place] to follow up.   2. Nasal congestion: cough and congestion improved last month with amoxicillin. He still has some congestion. No cough, sore throat, sinus pressure.    Social History  Substance Use Topics  . Smoking status: Never Smoker  . Smokeless tobacco: Never Used  . Alcohol use No    Outpatient Medications Prior to Visit  Medication Sig Dispense Refill  . lisinopril (PRINIVIL,ZESTRIL) 20 MG tablet Take 1 tablet (20 mg total) by mouth daily. 30 tablet 11  . metFORMIN (GLUCOPHAGE) 500 MG tablet Take 1 tablet (500 mg total) by mouth 2 (two) times daily with a meal. 60 tablet 11  . amoxicillin (AMOXIL) 500 MG capsule Take 1 capsule (500 mg total) by mouth 3 (three) times daily. (Patient not taking: Reported on 06/09/2016) 30 capsule 0  . docusate sodium (COLACE) 100 MG capsule Take 1 capsule (100 mg total) by mouth daily as needed for mild constipation. (Patient not taking: Reported on 06/09/2016) 30 capsule 5  . Elastic Bandages & Supports (MEDICAL COMPRESSION SOCKS) MISC 1 each by Does not apply route daily. 30 mm Hg pressure (Patient not taking: Reported on 06/09/2016) 2 each 0  . finasteride (PROSCAR) 5 MG tablet Take 1 tablet (5 mg total) by mouth daily. (Patient not taking: Reported  on 06/09/2016) 30 tablet 11  . polyethylene glycol powder (GLYCOLAX/MIRALAX) powder Take 17 g by mouth daily. (Patient not taking: Reported on 06/09/2016) 3350 g 5  . tamsulosin (FLOMAX) 0.4 MG CAPS capsule Take 2 capsules (0.8 mg total) by mouth daily. (Patient not taking: Reported on 06/09/2016) 60 capsule 11   No facility-administered medications prior to visit.     ROS Review of Systems  Constitutional: Negative for chills, fatigue, fever and unexpected weight change.  HENT: Positive for congestion.   Eyes: Negative for visual disturbance.  Respiratory: Negative for cough and shortness of breath.   Cardiovascular: Negative for chest pain and leg swelling.  Gastrointestinal: Negative for abdominal pain, blood in stool, constipation, diarrhea, nausea and vomiting.  Endocrine: Negative for polydipsia, polyphagia and polyuria.  Genitourinary: Negative for difficulty urinating.  Musculoskeletal: Negative for arthralgias, back pain, gait problem, myalgias and neck pain.  Skin: Negative for rash.  Allergic/Immunologic: Negative for immunocompromised state.  Hematological: Negative for adenopathy. Does not bruise/bleed easily.  Psychiatric/Behavioral: Negative for dysphoric mood, sleep disturbance and suicidal ideas. The patient is not nervous/anxious.     Objective:  BP 131/74 (BP Location: Right Arm, Patient Position: Sitting, Cuff Size: Small)   Pulse 70   Temp 98.3 F (36.8 C) (Oral)   Ht 5\' 5"  (1.651 m)   Wt 225 lb (102.1 kg)   SpO2 96%   BMI 37.44 kg/m   BP/Weight 06/09/2016 05/13/2016 03/12/2016  Systolic BP 131 129 -  Diastolic BP 74 77 -  Wt. (Lbs) 225 223.6 220  BMI 37.44 37.21 36.61    Physical Exam  Constitutional: He appears well-developed and well-nourished. No distress.  HENT:  Head: Normocephalic and atraumatic.  Nose: Mucosal edema present.  Neck: Normal range of motion. Neck supple.  Cardiovascular: Normal rate, regular rhythm, normal heart sounds and intact distal  pulses.   Pulmonary/Chest: Effort normal and breath sounds normal.  Musculoskeletal: He exhibits no edema.  Neurological: He is alert.  Skin: Skin is warm and dry. No rash noted. No erythema.  Psychiatric: He has a normal mood and affect.   Lab Results  Component Value Date   HGBA1C 5.8 05/13/2016   CBG 115  Assessment & Plan:   Louis Peterson was seen today for follow-up.  Diagnoses and all orders for this visit:  Diabetes mellitus type 2 in obese (HCC) -     POCT glucose (manual entry)  Nasal congestion -     fluticasone (FLONASE) 50 MCG/ACT nasal spray; Place 2 sprays into both nostrils daily.  Granulomatous prostatitis  Healthcare maintenance -     PPD    No orders of the defined types were placed in this encounter.   Follow-up: Return in about 2 days (around 06/11/2016) for PPD check .   Dessa PhiJosalyn Bonney Berres MD

## 2016-06-09 NOTE — Assessment & Plan Note (Signed)
flonase for nasal congestion

## 2016-06-12 ENCOUNTER — Ambulatory Visit: Payer: Medicare Other | Attending: Family Medicine

## 2016-06-12 DIAGNOSIS — Z111 Encounter for screening for respiratory tuberculosis: Secondary | ICD-10-CM | POA: Diagnosis not present

## 2016-06-12 LAB — TB SKIN TEST
Induration: 0 mm
TB Skin Test: NEGATIVE

## 2016-06-12 NOTE — Progress Notes (Signed)
Pt came in today for TB skin test reading. Pt TB skin test was negative.

## 2016-06-16 ENCOUNTER — Encounter: Payer: Self-pay | Admitting: Family Medicine

## 2016-06-16 ENCOUNTER — Telehealth: Payer: Self-pay

## 2016-06-16 NOTE — Telephone Encounter (Signed)
Pt was called and a VM was left informing pt of lab results and letter being ready for pick up.

## 2016-07-16 MED FILL — TAMSULOSIN HCL 0.4 MG CAP: 0.4 | 30 days supply | Qty: 60 | Fill #1

## 2016-08-12 MED FILL — FINASTERIDE 5 MG TABLET: 5 | 90 days supply | Qty: 90 | Fill #3

## 2016-08-12 MED FILL — metFORMIN HCL 500 MG TABS: 500 | 90 days supply | Qty: 180 | Fill #3

## 2016-08-12 MED FILL — TAMSULOSIN HCL 0.4 MG CAP: 0.4 | 30 days supply | Qty: 60 | Fill #2

## 2016-08-12 MED FILL — LISINOPRIL 20 MG TABLET: 20 | 90 days supply | Qty: 90 | Fill #3

## 2016-09-17 ENCOUNTER — Encounter: Payer: Self-pay | Admitting: Family Medicine

## 2016-10-17 MED FILL — TAMSULOSIN HCL 0.4 MG CAP: 0.4 | 30 days supply | Qty: 60 | Fill #3

## 2016-10-20 MED FILL — LISINOPRIL 20 MG TABLET: 20 | 30 days supply | Qty: 30 | Fill #0

## 2016-10-20 MED FILL — metFORMIN HCL 500 MG TABS: 500 | 30 days supply | Qty: 60 | Fill #0

## 2016-10-23 MED FILL — FINASTERIDE 5 MG TABLET: 5 | 30 days supply | Qty: 30 | Fill #0

## 2016-11-14 ENCOUNTER — Other Ambulatory Visit: Payer: Self-pay | Admitting: Family Medicine

## 2016-11-14 DIAGNOSIS — E1169 Type 2 diabetes mellitus with other specified complication: Secondary | ICD-10-CM

## 2016-11-14 DIAGNOSIS — E669 Obesity, unspecified: Principal | ICD-10-CM

## 2016-11-14 MED ORDER — ATORVASTATIN CALCIUM 20 MG PO TABS: 20.0000 mg | ORAL_TABLET | Freq: Every day | ORAL | 3 refills | Status: DC

## 2016-12-11 MED FILL — FINASTERIDE 5 MG TABLET: 5 | 90 days supply | Qty: 90 | Fill #1

## 2016-12-11 MED FILL — TAMSULOSIN HCL 0.4 MG CAP: 0.4 | 90 days supply | Qty: 180 | Fill #4

## 2016-12-11 MED FILL — LISINOPRIL 20 MG TAB: 20 | 90 days supply | Qty: 90 | Fill #1

## 2016-12-11 MED FILL — ?METFORMIN HCL 500MG TABLET: 500 | 90 days supply | Qty: 180 | Fill #1

## 2017-03-18 MED FILL — TAMSULOSIN HCL 0.4 MG CAP: 0.4 | 90 days supply | Qty: 180 | Fill #5

## 2017-03-18 MED FILL — FINASTERIDE 5 MG TABLET: 5 | 90 days supply | Qty: 90 | Fill #2

## 2017-03-18 MED FILL — LISINOPRIL 20 MG TABLET: 20 | 90 days supply | Qty: 90 | Fill #2

## 2017-03-18 MED FILL — metFORMIN HCL 500 MG TABS: 500 | 90 days supply | Qty: 180 | Fill #2

## 2017-07-09 MED FILL — FINASTERIDE 5 MG TABLET: 5 | 30 days supply | Qty: 30 | Fill #0

## 2017-07-09 MED FILL — TAMSULOSIN HCL 0.4 MG CAP: 0.4 | 30 days supply | Qty: 30 | Fill #0

## 2017-08-13 MED FILL — FINASTERIDE 5 MG TABLET: 5 | 30 days supply | Qty: 30 | Fill #1

## 2017-08-13 MED FILL — TAMSULOSIN HCL 0.4 MG CAP: 0.4 | 30 days supply | Qty: 30 | Fill #1

## 2017-09-14 ENCOUNTER — Ambulatory Visit: Payer: Medicare Other | Attending: Internal Medicine | Admitting: Internal Medicine

## 2017-09-14 ENCOUNTER — Encounter: Payer: Self-pay | Admitting: Internal Medicine

## 2017-09-14 VITALS — BP 158/68 | HR 92 | Temp 97.6°F | Resp 16 | Ht 67.5 in | Wt 229.6 lb

## 2017-09-14 DIAGNOSIS — Z1211 Encounter for screening for malignant neoplasm of colon: Secondary | ICD-10-CM | POA: Diagnosis not present

## 2017-09-14 DIAGNOSIS — E119 Type 2 diabetes mellitus without complications: Secondary | ICD-10-CM | POA: Diagnosis present

## 2017-09-14 DIAGNOSIS — Z23 Encounter for immunization: Secondary | ICD-10-CM

## 2017-09-14 DIAGNOSIS — K5904 Chronic idiopathic constipation: Secondary | ICD-10-CM

## 2017-09-14 DIAGNOSIS — Z79899 Other long term (current) drug therapy: Secondary | ICD-10-CM | POA: Diagnosis not present

## 2017-09-14 DIAGNOSIS — E1142 Type 2 diabetes mellitus with diabetic polyneuropathy: Secondary | ICD-10-CM | POA: Diagnosis not present

## 2017-09-14 DIAGNOSIS — N400 Enlarged prostate without lower urinary tract symptoms: Secondary | ICD-10-CM | POA: Diagnosis not present

## 2017-09-14 DIAGNOSIS — I10 Essential (primary) hypertension: Secondary | ICD-10-CM

## 2017-09-14 DIAGNOSIS — Z6835 Body mass index (BMI) 35.0-35.9, adult: Secondary | ICD-10-CM | POA: Insufficient documentation

## 2017-09-14 DIAGNOSIS — E669 Obesity, unspecified: Secondary | ICD-10-CM | POA: Diagnosis not present

## 2017-09-14 DIAGNOSIS — K5909 Other constipation: Secondary | ICD-10-CM | POA: Insufficient documentation

## 2017-09-14 DIAGNOSIS — E1169 Type 2 diabetes mellitus with other specified complication: Secondary | ICD-10-CM

## 2017-09-14 DIAGNOSIS — Z7984 Long term (current) use of oral hypoglycemic drugs: Secondary | ICD-10-CM | POA: Insufficient documentation

## 2017-09-14 DIAGNOSIS — Z7689 Persons encountering health services in other specified circumstances: Secondary | ICD-10-CM | POA: Diagnosis not present

## 2017-09-14 DIAGNOSIS — N419 Inflammatory disease of prostate, unspecified: Secondary | ICD-10-CM | POA: Insufficient documentation

## 2017-09-14 LAB — GLUCOSE, POCT (MANUAL RESULT ENTRY): POC Glucose: 193 mg/dl — AB (ref 70–99)

## 2017-09-14 LAB — POCT GLYCOSYLATED HEMOGLOBIN (HGB A1C): Hemoglobin A1C: 6.1

## 2017-09-14 MED ORDER — TETANUS-DIPHTH-ACELL PERTUSSIS 5-2.5-18.5 LF-MCG/0.5 IM SUSP: 0.5000 mL | Freq: Once | INTRAMUSCULAR | 0 refills | Status: DC

## 2017-09-14 MED ORDER — TETANUS-DIPHTH-ACELL PERTUSSIS 5-2.5-18.5 LF-MCG/0.5 IM SUSP: 0.5000 mL | Freq: Once | INTRAMUSCULAR | 0 refills | Status: AC

## 2017-09-14 MED ORDER — FINASTERIDE 5 MG PO TABS: 5.0000 mg | ORAL_TABLET | Freq: Every day | ORAL | 11 refills | Status: DC

## 2017-09-14 MED ORDER — TAMSULOSIN HCL 0.4 MG PO CAPS: 0.8000 mg | ORAL_CAPSULE | Freq: Every day | ORAL | 11 refills | Status: DC

## 2017-09-14 MED ORDER — DOCUSATE SODIUM 100 MG PO CAPS: 100.0000 mg | ORAL_CAPSULE | Freq: Every day | ORAL | 5 refills | Status: DC | PRN

## 2017-09-14 MED ORDER — LISINOPRIL-HYDROCHLOROTHIAZIDE 10-12.5 MG PO TABS: 1.0000 | ORAL_TABLET | Freq: Every day | ORAL | 3 refills | Status: DC

## 2017-09-14 MED ORDER — ATORVASTATIN CALCIUM 20 MG PO TABS: 20.0000 mg | ORAL_TABLET | Freq: Every day | ORAL | 3 refills | Status: DC

## 2017-09-14 MED ORDER — METFORMIN HCL 500 MG PO TABS: 500.0000 mg | ORAL_TABLET | Freq: Two times a day (BID) | ORAL | 11 refills | Status: DC

## 2017-09-14 MED FILL — TAMSULOSIN HCL 0.4 MG CAP: 0.4 | 30 days supply | Qty: 60 | Fill #0

## 2017-09-14 MED FILL — ATORVASTATIN 20 MG TABLET: 20 | 30 days supply | Qty: 30 | Fill #0

## 2017-09-14 MED FILL — LISINOPRIL-HCTZ 10-12.5 MG: 10-12.5 | 30 days supply | Qty: 30 | Fill #0

## 2017-09-14 MED FILL — metFORMIN HCL 500 MG TABS: 500 | 30 days supply | Qty: 60 | Fill #0

## 2017-09-14 MED FILL — FINASTERIDE 5 MG TABLET: 5 | 30 days supply | Qty: 30 | Fill #0
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
## 2017-09-15 LAB — COMPREHENSIVE METABOLIC PANEL
ALBUMIN: 3.9 g/dL (ref 3.5–4.8)
ALK PHOS: 125 IU/L — AB (ref 39–117)
ALT: 21 IU/L (ref 0–44)
AST: 21 IU/L (ref 0–40)
Albumin/Globulin Ratio: 1.3 (ref 1.2–2.2)
BILIRUBIN TOTAL: 0.2 mg/dL (ref 0.0–1.2)
BUN / CREAT RATIO: 16 (ref 10–24)
BUN: 13 mg/dL (ref 8–27)
CHLORIDE: 104 mmol/L (ref 96–106)
CO2: 23 mmol/L (ref 20–29)
CREATININE: 0.82 mg/dL (ref 0.76–1.27)
Calcium: 9.3 mg/dL (ref 8.6–10.2)
GFR calc Af Amer: 102 mL/min/{1.73_m2} (ref >59)
GFR calc non Af Amer: 88 mL/min/{1.73_m2} (ref >59)
GLOBULIN, TOTAL: 3 g/dL (ref 1.5–4.5)
GLUCOSE: 109 mg/dL — AB (ref 65–99)
Potassium: 4.4 mmol/L (ref 3.5–5.2)
SODIUM: 140 mmol/L (ref 134–144)
Total Protein: 6.9 g/dL (ref 6.0–8.5)

## 2017-09-15 LAB — CBC
Hematocrit: 44.5 % (ref 37.5–51.0)
Hemoglobin: 15.1 g/dL (ref 13.0–17.7)
MCH: 30.7 pg (ref 26.6–33.0)
MCHC: 33.9 g/dL (ref 31.5–35.7)
MCV: 90 fL (ref 79–97)
PLATELETS: 216 10*3/uL (ref 150–379)
RBC: 4.92 x10E6/uL (ref 4.14–5.80)
RDW: 13.9 % (ref 12.3–15.4)
WBC: 7.1 10*3/uL (ref 3.4–10.8)

## 2017-09-15 LAB — LIPID PANEL
CHOLESTEROL TOTAL: 209 mg/dL — AB (ref 100–199)
Chol/HDL Ratio: 3.7 ratio (ref 0.0–5.0)
HDL: 57 mg/dL (ref >39)
LDL Calculated: 130 mg/dL — ABNORMAL HIGH (ref 0–99)
TRIGLYCERIDES: 109 mg/dL (ref 0–149)
VLDL CHOLESTEROL CAL: 22 mg/dL (ref 5–40)

## 2017-09-16 ENCOUNTER — Telehealth: Payer: Self-pay

## 2017-09-16 DIAGNOSIS — N401 Enlarged prostate with lower urinary tract symptoms: Secondary | ICD-10-CM | POA: Diagnosis not present

## 2017-09-16 DIAGNOSIS — R351 Nocturia: Secondary | ICD-10-CM | POA: Diagnosis not present

## 2017-09-16 DIAGNOSIS — R3912 Poor urinary stream: Secondary | ICD-10-CM | POA: Diagnosis not present

## 2017-09-16 DIAGNOSIS — R972 Elevated prostate specific antigen [PSA]: Secondary | ICD-10-CM | POA: Diagnosis not present

## 2017-09-16 NOTE — Telephone Encounter (Signed)
CMA call patient regarding lab results   Patient did not answer & no VM set up to leave message

## 2017-09-16 NOTE — Telephone Encounter (Signed)
-----   Message from Particia Lather, [place] sent at 09/15/2017  1:47 PM EDT -----   ----- Message ----- From: Marcine Matar, MD Sent: 09/15/2017   1:12 PM To: Particia Lather, RMA  Let patient know that his cholesterol was elevated.  I restarted him on atorvastatin when I saw him yesterday.  He should pick up the prescription and take as prescribed.  His blood count, kidney and liver function tests are good.

## 2017-09-18 MED FILL — $BOOSTRIX VACCINE SYRINGE: 5-2.5-18.5 | 1 days supply | Qty: 1 | Fill #0

## 2017-10-06 MED FILL — LISINOPRIL-HCTZ 10-12.5 MG: 10-12.5 | 30 days supply | Qty: 30 | Fill #1

## 2017-10-06 MED FILL — metFORMIN HCL 500 MG TABS: 500 | 30 days supply | Qty: 60 | Fill #1

## 2017-10-06 MED FILL — TAMSULOSIN HCL 0.4 MG CAP: 0.4 | 30 days supply | Qty: 60 | Fill #1

## 2017-10-06 MED FILL — ATORVASTATIN 20 MG TABLET: 20 | 30 days supply | Qty: 30 | Fill #1

## 2017-10-06 MED FILL — FINASTERIDE 5 MG TABLET: 5 | 30 days supply | Qty: 30 | Fill #1

## 2017-11-09 MED FILL — metFORMIN HCL 500 MG TABS: 500 | 60 days supply | Qty: 120 | Fill #2

## 2017-11-09 MED FILL — ATORVASTATIN 20 MG TABLET: 20 | 60 days supply | Qty: 60 | Fill #2

## 2017-11-09 MED FILL — LISINOPRIL-HCTZ 10-12.5 MG: 10-12.5 | 60 days supply | Qty: 60 | Fill #2

## 2017-11-09 MED FILL — FINASTERIDE 5 MG TABLET: 5 | 60 days supply | Qty: 60 | Fill #2

## 2017-11-09 MED FILL — TAMSULOSIN HCL 0.4 MG CAP: 0.4 | 60 days supply | Qty: 120 | Fill #2

## 2017-12-15 ENCOUNTER — Ambulatory Visit: Payer: Medicare Other | Admitting: Internal Medicine

## 2018-01-19 MED FILL — ATORVASTATIN 20 MG TABLET: 20 | 30 days supply | Qty: 30 | Fill #3

## 2018-01-19 MED FILL — metFORMIN HCL 500 MG TABS: 500 | 30 days supply | Qty: 60 | Fill #3

## 2018-01-19 MED FILL — TAMSULOSIN HCL 0.4 MG CAP: 0.4 | 30 days supply | Qty: 60 | Fill #3

## 2018-01-19 MED FILL — LISINOPRIL-HCTZ 10-12.5 MG: 10-12.5 | 30 days supply | Qty: 30 | Fill #3

## 2018-01-19 MED FILL — FINASTERIDE 5 MG TABLET: 5 | 30 days supply | Qty: 30 | Fill #3

## 2018-03-08 MED FILL — LISINOPRIL-HCTZ 10-12.5 MG: 10-12.5 | 30 days supply | Qty: 30 | Fill #4

## 2018-03-08 MED FILL — FINASTERIDE 5 MG TABLET: 5 | 30 days supply | Qty: 30 | Fill #4

## 2018-03-08 MED FILL — ATORVASTATIN 20 MG TABLET: 20 | 30 days supply | Qty: 30 | Fill #4

## 2018-03-08 MED FILL — TAMSULOSIN HCL 0.4 MG CAP: 0.4 | 30 days supply | Qty: 60 | Fill #4

## 2018-03-08 MED FILL — metFORMIN HCL 500 MG TABS: 500 | 30 days supply | Qty: 60 | Fill #4

## 2018-03-15 ENCOUNTER — Ambulatory Visit: Payer: Medicare Other | Admitting: Internal Medicine

## 2018-03-25 ENCOUNTER — Ambulatory Visit: Payer: Medicare Other | Admitting: Internal Medicine

## 2018-03-26 ENCOUNTER — Ambulatory Visit: Payer: Medicare Other | Attending: Internal Medicine | Admitting: Physician Assistant

## 2018-03-26 VITALS — BP 142/82 | HR 106 | Temp 98.1°F | Ht 67.5 in | Wt 229.8 lb

## 2018-03-26 DIAGNOSIS — E669 Obesity, unspecified: Secondary | ICD-10-CM | POA: Diagnosis not present

## 2018-03-26 DIAGNOSIS — Z79899 Other long term (current) drug therapy: Secondary | ICD-10-CM | POA: Diagnosis not present

## 2018-03-26 DIAGNOSIS — Z789 Other specified health status: Secondary | ICD-10-CM | POA: Diagnosis not present

## 2018-03-26 DIAGNOSIS — N400 Enlarged prostate without lower urinary tract symptoms: Secondary | ICD-10-CM | POA: Insufficient documentation

## 2018-03-26 DIAGNOSIS — E1165 Type 2 diabetes mellitus with hyperglycemia: Secondary | ICD-10-CM

## 2018-03-26 DIAGNOSIS — I10 Essential (primary) hypertension: Secondary | ICD-10-CM | POA: Diagnosis not present

## 2018-03-26 DIAGNOSIS — R0989 Other specified symptoms and signs involving the circulatory and respiratory systems: Secondary | ICD-10-CM | POA: Diagnosis present

## 2018-03-26 DIAGNOSIS — Z7951 Long term (current) use of inhaled steroids: Secondary | ICD-10-CM | POA: Insufficient documentation

## 2018-03-26 DIAGNOSIS — J9801 Acute bronchospasm: Secondary | ICD-10-CM | POA: Insufficient documentation

## 2018-03-26 DIAGNOSIS — Z6835 Body mass index (BMI) 35.0-35.9, adult: Secondary | ICD-10-CM | POA: Diagnosis not present

## 2018-03-26 DIAGNOSIS — E119 Type 2 diabetes mellitus without complications: Secondary | ICD-10-CM | POA: Diagnosis not present

## 2018-03-26 DIAGNOSIS — R0981 Nasal congestion: Secondary | ICD-10-CM

## 2018-03-26 DIAGNOSIS — Z7984 Long term (current) use of oral hypoglycemic drugs: Secondary | ICD-10-CM | POA: Diagnosis not present

## 2018-03-26 DIAGNOSIS — M7989 Other specified soft tissue disorders: Secondary | ICD-10-CM | POA: Diagnosis not present

## 2018-03-26 DIAGNOSIS — J40 Bronchitis, not specified as acute or chronic: Secondary | ICD-10-CM | POA: Insufficient documentation

## 2018-03-26 DIAGNOSIS — E1169 Type 2 diabetes mellitus with other specified complication: Secondary | ICD-10-CM

## 2018-03-26 LAB — POCT GLYCOSYLATED HEMOGLOBIN (HGB A1C): HEMOGLOBIN A1C: 7 % — AB (ref 4.0–5.6)

## 2018-03-26 LAB — GLUCOSE, POCT (MANUAL RESULT ENTRY): POC GLUCOSE: 230 mg/dL — AB (ref 70–99)

## 2018-03-26 MED ORDER — AZITHROMYCIN 250 MG PO TABS: ORAL_TABLET | ORAL | 0 refills | Status: DC

## 2018-03-26 MED ORDER — FLUTICASONE PROPIONATE 50 MCG/ACT NA SUSP: 2.0000 | Freq: Every day | NASAL | 6 refills | Status: DC

## 2018-03-26 MED ORDER — FLUTICASONE-SALMETEROL 250-50 MCG/DOSE IN AEPB: 1.0000 | INHALATION_SPRAY | Freq: Two times a day (BID) | RESPIRATORY_TRACT | 0 refills | Status: DC

## 2018-03-26 MED ORDER — METFORMIN HCL 500 MG PO TABS: 500.0000 mg | ORAL_TABLET | Freq: Two times a day (BID) | ORAL | 5 refills | Status: DC

## 2018-03-26 MED ORDER — FINASTERIDE 5 MG PO TABS: 5.0000 mg | ORAL_TABLET | Freq: Every day | ORAL | 11 refills | Status: DC

## 2018-03-26 MED ORDER — ALBUTEROL SULFATE HFA 108 (90 BASE) MCG/ACT IN AERS: 2.0000 | INHALATION_SPRAY | Freq: Four times a day (QID) | RESPIRATORY_TRACT | 2 refills | Status: DC | PRN

## 2018-03-26 MED ORDER — IPRATROPIUM-ALBUTEROL 0.5-2.5 (3) MG/3ML IN SOLN
3.0000 mL | Freq: Once | RESPIRATORY_TRACT | Status: AC
  Administered 2018-03-26: 3 mL via RESPIRATORY_TRACT

## 2018-03-26 MED ORDER — TAMSULOSIN HCL 0.4 MG PO CAPS: 0.8000 mg | ORAL_CAPSULE | Freq: Every day | ORAL | 11 refills | Status: DC

## 2018-03-26 MED ORDER — ATORVASTATIN CALCIUM 20 MG PO TABS: 20.0000 mg | ORAL_TABLET | Freq: Every day | ORAL | 3 refills | Status: DC

## 2018-03-26 MED ORDER — LISINOPRIL-HYDROCHLOROTHIAZIDE 10-12.5 MG PO TABS: 1.0000 | ORAL_TABLET | Freq: Every day | ORAL | 3 refills | Status: DC

## 2018-03-26 MED FILL — FLUTICASONE PROP 50 MCG SPR: 50 | 30 days supply | Qty: 16 | Fill #0

## 2018-03-26 MED FILL — AZITHROMYCIN 250 MG TABLET: 250 | 5 days supply | Qty: 6 | Fill #0

## 2018-03-26 MED FILL — ALBUTEROL SULFATE HFA 108 (: 108 (90 BAS | 25 days supply | Qty: 18 | Fill #0

## 2018-03-26 MED FILL — !ADVAIR 250/50 DISKUS: 250-50 | 30 days supply | Qty: 60 | Fill #0

## 2018-03-26 NOTE — Patient Instructions (Signed)
Decrease sugar and salt intake to decrease hand swelling.  Drink more water

## 2018-03-26 NOTE — Progress Notes (Signed)
Patient ID: Louis Peterson, male   DOB: 1945-08-22, 72 y.o.   MRN: [id number]   [facility] Ardine Eng, is a 72 y.o. male  MVH:[id number]  [id number]  DOB - 1945/07/14  Subjective:  Chief Complaint and HPI: Louis Peterson is a 72 y.o. male here today for chest congestion and wheezing for about 1 week.  Coughing up yellow phlegm.  No fever.  +nasal congestion.  H/O asthma many years ago.    Out of BP meds.  Compliant with Diabetes meds but not with diet.  Drank a hot chocolate just PTA.    B hand swelling/puffiness X 3 weeks.  Admits to poor diet-lots of salt/sugar.    Daughter is translating/stratus refused.    ROS:   Constitutional:  No f/c, No night sweats, No unexplained weight loss. EENT:  No vision changes, No blurry vision, No hearing changes. No mouth, throat, or ear problems.  Respiratory: +cough, +wheezing. Cardiac: No CP, no palpitations GI:  No abd pain, No N/V/D. GU: No Urinary s/sx Musculoskeletal: No joint pain Neuro: No headache, no dizziness, no motor weakness.  Skin: No rash Endocrine:  No polydipsia. No polyuria.  Psych: Denies SI/HI  No problems updated.  ALLERGIES: No Known Allergies  PAST MEDICAL HISTORY: Past Medical History:  Diagnosis Date  . Acute osteomyelitis of fibula (HCC)   . Diabetes mellitus without complication (HCC)   . Enlarged prostate   . Hardware complicating wound infection (HCC)   . Hypertension   . MSSA (methicillin susceptible Staphylococcus aureus) infection   . 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 Authorizing Provider  atorvastatin (LIPITOR) 20 MG tablet Take 1 tablet (20 mg total) by mouth daily. 03/26/18  Yes Georgian Co M, PA-C  docusate sodium (COLACE) 100 MG capsule Take 1 capsule (100 mg total) by mouth daily as needed for mild constipation. 09/14/17  Yes Marcine Matar, MD  finasteride (PROSCAR) 5  MG tablet Take 1 tablet (5 mg total) by mouth daily. 03/26/18  Yes Mikell Camp M, PA-C  fluticasone (FLONASE) 50 MCG/ACT nasal spray Place 2 sprays into both nostrils daily. 03/26/18  Yes Cy Bresee M, PA-C  lisinopril-hydrochlorothiazide (PRINZIDE,ZESTORETIC) 10-12.5 MG tablet Take 1 tablet by mouth daily. 03/26/18  Yes Georgian Co M, PA-C  metFORMIN (GLUCOPHAGE) 500 MG tablet Take 1 tablet (500 mg total) by mouth 2 (two) times daily with a meal. 03/26/18  Yes Maecie Sevcik M, PA-C  tamsulosin (FLOMAX) 0.4 MG CAPS capsule Take 2 capsules (0.8 mg total) by mouth daily. 03/26/18  Yes Dennie Moltz, Marzella Schlein, PA-C  albuterol (PROVENTIL HFA;VENTOLIN HFA) 108 (90 Base) MCG/ACT inhaler Inhale 2 puffs into the lungs every 6 (six) hours as needed for wheezing or shortness of breath. 03/26/18   Anders Simmonds, PA-C  azithromycin (ZITHROMAX) 250 MG tablet Take 2 today then 1 daily 03/26/18   Anders Simmonds, PA-C  Elastic Bandages & Supports (MEDICAL COMPRESSION SOCKS) MISC 1 each by Does not apply route daily. 30 mm Hg pressure Patient not taking: Reported on 06/09/2016 05/13/16   Dessa Phi, MD  Fluticasone-Salmeterol (ADVAIR) 250-50 MCG/DOSE AEPB Inhale 1 puff into the lungs 2 (two) times daily. 03/26/18   Anders Simmonds, PA-C     Objective:  EXAM:   Vitals:   03/26/18 0939  BP: (!) 142/82  Pulse: (!) 106  Temp: 98.1 F (36.7 C)  TempSrc: Oral  SpO2: 92%  Weight: 229 lb 12.8 oz (104.2 kg)  Height: 5' 7.5" (1.715 m)    General appearance : A&OX3. NAD. Non-toxic-appearing HEENT: Atraumatic and Normocephalic.  PERRLA. EOM intact.  TM full B. Mouth-MMM, post pharynx WNL w/o erythema, No PND. Neck: supple, no JVD. No cervical lymphadenopathy. No thyromegaly Chest/Lungs:  Breathing-non-labored, Fair air entry bilaterally, breath sounds normal without rales or rhonchi. There is mild to moderate wheezing throughout CVS: S1 S2 regular, no murmurs, gallops, rubs  Extremities:  Bilateral Lower Ext shows no edema, both legs are warm to touch with = pulse throughout.  B hands "puffy" w/o true edema.  No erythema or induration grip and strength are normal. Neurology:  CN II-XII grossly intact, Non focal.   Psych:  TP linear. J/I WNL. Normal speech. Appropriate eye contact and affect.  Skin:  No Rash  Data Review Lab Results  Component Value Date   HGBA1C 7.0 (A) 03/26/2018   HGBA1C 6.1 09/14/2017   HGBA1C 5.8 05/13/2016     Assessment & Plan   1. Bronchitis -may require systemic steroids, but blood sugar already high here today.  -will try topical first.  Cover for atypical - Fluticasone-Salmeterol (ADVAIR) 250-50 MCG/DOSE AEPB; Inhale 1 puff into the lungs 2 (two) times daily.  Dispense: 60 each; Refill: 0 - azithromycin (ZITHROMAX) 250 MG tablet; Take 2 today then 1 daily  Dispense: 6 each; Refill: 0  2. Diabetes mellitus type 2 in obese [facility]) Adequately/almost controlled for age bc A1C=7.0.  Advised to work on eliminating sugar from diet - Glucose (CBG) - HgB A1c - metFORMIN (GLUCOPHAGE) 500 MG tablet; Take 1 tablet (500 mg total) by mouth 2 (two) times daily with a meal.  Dispense: 60 tablet; Refill: 5 - atorvastatin (LIPITOR) 20 MG tablet; Take 1 tablet (20 mg total) by mouth daily.  Dispense: 90 tablet; Refill: 3 - Comprehensive metabolic panel - Lipid panel - CBC with Differential/Platelet  3. Bronchospasm Improved after breathing treatment-subjectively felt much better - ipratropium-albuterol (DUONEB) 0.5-2.5 (3) MG/3ML nebulizer solution 3 mL - albuterol (PROVENTIL HFA;VENTOLIN HFA) 108 (90 Base) MCG/ACT inhaler; Inhale 2 puffs into the lungs every 6 (six) hours as needed for wheezing or shortness of breath.  Dispense: 1 Inhaler; Refill: 2 - Fluticasone-Salmeterol (ADVAIR) 250-50 MCG/DOSE AEPB; Inhale 1 puff into the lungs 2 (two) times daily.  Dispense: 60 each; Refill: 0  4. Essential hypertension Uncontrolled today but out of meds.  Resume  meds - lisinopril-hydrochlorothiazide (PRINZIDE,ZESTORETIC) 10-12.5 MG tablet; Take 1 tablet by mouth daily.  Dispense: 90 tablet; Refill: 3 - Comprehensive metabolic panel - CBC with Differential/Platelet  5. Nasal congestion - fluticasone (FLONASE) 50 MCG/ACT nasal spray; Place 2 sprays into both nostrils daily.  Dispense: 16 g; Refill: 6  6. Benign prostatic hyperplasia without lower urinary tract symptoms continue - finasteride (PROSCAR) 5 MG tablet; Take 1 tablet (5 mg total) by mouth daily.  Dispense: 30 tablet; Refill: 11 - tamsulosin (FLOMAX) 0.4 MG CAPS capsule; Take 2 capsules (0.8 mg total) by mouth daily.  Dispense: 60 capsule; Refill: 11  7. Language barrier Daughter interpreted and additional time performing visit was required.   8. Swelling of both hands Decrease salt and sugar in diet.  Drink water(he usu drinks sweet tea or tea with honey.     Patient have been counseled extensively about nutrition and exercise  Return in about 3 months (around 06/26/2018) for assign new pcp-f/up htn and DN.  The patient was given clear instructions to go to ER or  return to medical center if symptoms don't improve, worsen or new problems develop. The patient verbalized understanding. The patient was told to call to get lab results if they haven't heard anything in the next week.     Georgian CoAngela Avalene Sealy, PA-C [facility] [place], [place] [phone]   03/26/2018, 10:08 AM

## 2018-03-26 NOTE — Progress Notes (Signed)
Patient has asthma and is having chest pains with coughing.  Patient is having swelling in hands.

## 2018-03-27 LAB — LIPID PANEL
CHOL/HDL RATIO: 3.4 ratio (ref 0.0–5.0)
CHOLESTEROL TOTAL: 144 mg/dL (ref 100–199)
HDL: 42 mg/dL (ref >39)
LDL Calculated: 57 mg/dL (ref 0–99)
TRIGLYCERIDES: 224 mg/dL — AB (ref 0–149)
VLDL Cholesterol Cal: 45 mg/dL — ABNORMAL HIGH (ref 5–40)

## 2018-03-27 LAB — COMPREHENSIVE METABOLIC PANEL
ALBUMIN: 4.2 g/dL (ref 3.5–4.8)
ALK PHOS: 114 IU/L (ref 39–117)
ALT: 43 IU/L (ref 0–44)
AST: 25 IU/L (ref 0–40)
Albumin/Globulin Ratio: 1.6 (ref 1.2–2.2)
BUN / CREAT RATIO: 10 (ref 10–24)
BUN: 10 mg/dL (ref 8–27)
Bilirubin Total: 0.5 mg/dL (ref 0.0–1.2)
CALCIUM: 9.9 mg/dL (ref 8.6–10.2)
CHLORIDE: 96 mmol/L (ref 96–106)
CO2: 25 mmol/L (ref 20–29)
CREATININE: 0.99 mg/dL (ref 0.76–1.27)
GFR calc Af Amer: 88 mL/min/{1.73_m2} (ref >59)
GFR calc non Af Amer: 76 mL/min/{1.73_m2} (ref >59)
GLOBULIN, TOTAL: 2.7 g/dL (ref 1.5–4.5)
GLUCOSE: 233 mg/dL — AB (ref 65–99)
Potassium: 3.9 mmol/L (ref 3.5–5.2)
Sodium: 141 mmol/L (ref 134–144)
TOTAL PROTEIN: 6.9 g/dL (ref 6.0–8.5)

## 2018-03-27 LAB — CBC WITH DIFFERENTIAL/PLATELET
BASOS ABS: 0.1 10*3/uL (ref 0.0–0.2)
BASOS: 1 %
EOS (ABSOLUTE): 0.2 10*3/uL (ref 0.0–0.4)
Eos: 3 %
HEMOGLOBIN: 15.5 g/dL (ref 13.0–17.7)
Hematocrit: 45.3 % (ref 37.5–51.0)
IMMATURE GRANS (ABS): 0 10*3/uL (ref 0.0–0.1)
IMMATURE GRANULOCYTES: 0 %
LYMPHS: 38 %
Lymphocytes Absolute: 3.2 10*3/uL — ABNORMAL HIGH (ref 0.7–3.1)
MCH: 30.4 pg (ref 26.6–33.0)
MCHC: 34.2 g/dL (ref 31.5–35.7)
MCV: 89 fL (ref 79–97)
MONOCYTES: 8 %
Monocytes Absolute: 0.7 10*3/uL (ref 0.1–0.9)
NEUTROS PCT: 50 %
Neutrophils Absolute: 4.3 10*3/uL (ref 1.4–7.0)
PLATELETS: 198 10*3/uL (ref 150–450)
RBC: 5.1 x10E6/uL (ref 4.14–5.80)
RDW: 13.1 % (ref 12.3–15.4)
WBC: 8.5 10*3/uL (ref 3.4–10.8)

## 2018-04-07 MED FILL — FINASTERIDE 5 MG TABLET: 5 | 60 days supply | Qty: 60 | Fill #5

## 2018-04-07 MED FILL — TAMSULOSIN HCL 0.4 MG CAP: 0.4 | 60 days supply | Qty: 120 | Fill #5

## 2018-04-07 MED FILL — ATORVASTATIN 20 MG TABLET: 20 | 60 days supply | Qty: 60 | Fill #5

## 2018-04-07 MED FILL — LISINOPRIL-HCTZ 10-12.5 MG: 10-12.5 | 60 days supply | Qty: 60 | Fill #5

## 2018-04-07 MED FILL — metFORMIN HCL 500 MG TABS: 500 | 60 days supply | Qty: 120 | Fill #5

## 2018-04-12 ENCOUNTER — Telehealth: Payer: Self-pay

## 2018-04-12 NOTE — Telephone Encounter (Signed)
Patient was called and lab results were given to daughter. 

## 2018-04-12 NOTE — Telephone Encounter (Signed)
-----   Message from Anders SimmondsAngela M McClung, [place]PA-C sent at 03/28/2018  5:39 PM EST ----- Please call patient.  His labwork was good other than his triglycerides.  Work to further eliminate sugar from diet.  Follow up as planned.  Thanks, Georgian CoAngela McClung, PA-c

## 2018-06-17 MED FILL — LISINOPRIL-HCTZ 10-12.5 MG: 10-12.5 | 30 days supply | Qty: 30 | Fill #6

## 2018-06-17 MED FILL — metFORMIN HCL 500 MG TABS: 500 | 30 days supply | Qty: 60 | Fill #6

## 2018-06-17 MED FILL — FINASTERIDE 5 MG TABLET: 5 | 30 days supply | Qty: 30 | Fill #5

## 2018-06-17 MED FILL — TAMSULOSIN HCL 0.4 MG CAP: 0.4 | 30 days supply | Qty: 60 | Fill #6

## 2018-06-17 MED FILL — ATORVASTATIN 20 MG TABLET: 20 | 60 days supply | Qty: 60 | Fill #6

## 2018-07-13 ENCOUNTER — Ambulatory Visit: Payer: Medicare Other | Admitting: Internal Medicine

## 2018-07-15 MED FILL — metFORMIN HCL 500 MG TABS: 500 | 60 days supply | Qty: 120 | Fill #7

## 2018-07-15 MED FILL — TAMSULOSIN HCL 0.4 MG CAP: 0.4 | 60 days supply | Qty: 120 | Fill #7

## 2018-07-15 MED FILL — FINASTERIDE 5 MG TABLET: 5 | 60 days supply | Qty: 60 | Fill #6

## 2018-07-15 MED FILL — LISINOPRIL-HCTZ 10-12.5 MG: 10-12.5 | 60 days supply | Qty: 60 | Fill #7

## 2018-09-08 MED FILL — FINASTERIDE 5 MG TABLET: 5 | 60 days supply | Qty: 60 | Fill #7

## 2018-09-08 MED FILL — metFORMIN HCL 500 MG TABS: 500 | 30 days supply | Qty: 60 | Fill #8

## 2018-09-08 MED FILL — LISINOPRIL-HCTZ 10-12.5 MG: 10-12.5 | 30 days supply | Qty: 30 | Fill #8

## 2018-09-08 MED FILL — ATORVASTATIN 20 MG TABLET: 20 | 60 days supply | Qty: 60 | Fill #7

## 2018-09-08 MED FILL — TAMSULOSIN HCL 0.4 MG CAP: 0.4 | 30 days supply | Qty: 60 | Fill #8

## 2018-11-08 ENCOUNTER — Telehealth: Payer: Self-pay | Admitting: Internal Medicine

## 2018-11-08 ENCOUNTER — Ambulatory Visit: Payer: Medicare Other | Admitting: Internal Medicine

## 2018-11-08 MED FILL — TAMSULOSIN HCL 0.4 MG CAP: 0.4 | 30 days supply | Qty: 60 | Fill #0

## 2018-11-08 MED FILL — LISINOPRIL-HCTZ 10-12.5 MG: 10-12.5 | 30 days supply | Qty: 30 | Fill #0

## 2018-11-08 MED FILL — FINASTERIDE 5 MG TABLET: 5 | 30 days supply | Qty: 30 | Fill #0

## 2018-11-08 MED FILL — metFORMIN HCL 500 MG TABS: 500 | 30 days supply | Qty: 60 | Fill #0

## 2018-11-08 MED FILL — ATORVASTATIN 20 MG TABLET: 20 | 30 days supply | Qty: 30 | Fill #0

## 2018-11-08 MED FILL — FLUTICASONE PROP 50 MCG SPR: 50 | 30 days supply | Qty: 16 | Fill #1

## 2018-11-08 NOTE — Telephone Encounter (Signed)
1) Medication(s) Requested (by name): atorvastatin (LIPITOR) 20 MG tablet [[id number]]  tamsulosin (FLOMAX) 0.4 MG CAPS capsule [[id number]]  metFORMIN (GLUCOPHAGE) 500 MG tablet [[id number]]  lisinopril-hydrochlorothiazide (PRINZIDE,ZESTORETIC) 10-12.5 MG tablet [[id number]]  fluticasone (FLONASE) 50 MCG/ACT nasal spray [[id number]]  finasteride (PROSCAR) 5 MG tablet [[id number]]     2) Pharmacy of Choice: [facility]   3) Special Requests:   Approved medications will be sent to the pharmacy, we will reach out if there is an issue.  Requests made after 3pm may not be addressed until the following business day!  If a patient is unsure of the name of the medication(s) please note and ask patient to call back when they are able to provide all info, do not send to responsible party until all information is available!

## 2018-11-09 NOTE — Telephone Encounter (Signed)
Pt had refills of all meds available at the pharmacy, they were already refilled.

## 2018-11-15 ENCOUNTER — Ambulatory Visit: Payer: Medicare Other | Attending: Family Medicine | Admitting: Family Medicine

## 2018-11-15 ENCOUNTER — Other Ambulatory Visit: Payer: Self-pay

## 2018-11-15 ENCOUNTER — Encounter: Payer: Self-pay | Admitting: Family Medicine

## 2018-11-15 VITALS — BP 128/79 | HR 88 | Temp 98.6°F | Ht 67.5 in | Wt 218.2 lb

## 2018-11-15 DIAGNOSIS — R319 Hematuria, unspecified: Secondary | ICD-10-CM | POA: Insufficient documentation

## 2018-11-15 DIAGNOSIS — R350 Frequency of micturition: Secondary | ICD-10-CM

## 2018-11-15 DIAGNOSIS — R609 Edema, unspecified: Secondary | ICD-10-CM

## 2018-11-15 DIAGNOSIS — Z7984 Long term (current) use of oral hypoglycemic drugs: Secondary | ICD-10-CM | POA: Diagnosis not present

## 2018-11-15 DIAGNOSIS — E785 Hyperlipidemia, unspecified: Secondary | ICD-10-CM | POA: Diagnosis not present

## 2018-11-15 DIAGNOSIS — N401 Enlarged prostate with lower urinary tract symptoms: Secondary | ICD-10-CM | POA: Diagnosis not present

## 2018-11-15 DIAGNOSIS — Z7951 Long term (current) use of inhaled steroids: Secondary | ICD-10-CM | POA: Insufficient documentation

## 2018-11-15 DIAGNOSIS — I10 Essential (primary) hypertension: Secondary | ICD-10-CM | POA: Diagnosis not present

## 2018-11-15 DIAGNOSIS — N400 Enlarged prostate without lower urinary tract symptoms: Secondary | ICD-10-CM

## 2018-11-15 DIAGNOSIS — E1140 Type 2 diabetes mellitus with diabetic neuropathy, unspecified: Secondary | ICD-10-CM | POA: Diagnosis present

## 2018-11-15 DIAGNOSIS — R0981 Nasal congestion: Secondary | ICD-10-CM | POA: Diagnosis not present

## 2018-11-15 DIAGNOSIS — Z79899 Other long term (current) drug therapy: Secondary | ICD-10-CM | POA: Insufficient documentation

## 2018-11-15 DIAGNOSIS — K5904 Chronic idiopathic constipation: Secondary | ICD-10-CM

## 2018-11-15 DIAGNOSIS — R600 Localized edema: Secondary | ICD-10-CM | POA: Diagnosis not present

## 2018-11-15 DIAGNOSIS — E119 Type 2 diabetes mellitus without complications: Secondary | ICD-10-CM

## 2018-11-15 DIAGNOSIS — J9801 Acute bronchospasm: Secondary | ICD-10-CM

## 2018-11-15 DIAGNOSIS — E669 Obesity, unspecified: Secondary | ICD-10-CM

## 2018-11-15 DIAGNOSIS — E1169 Type 2 diabetes mellitus with other specified complication: Secondary | ICD-10-CM | POA: Diagnosis not present

## 2018-11-15 DIAGNOSIS — N138 Other obstructive and reflux uropathy: Secondary | ICD-10-CM | POA: Diagnosis not present

## 2018-11-15 LAB — POCT URINALYSIS DIP (CLINITEK)
Bilirubin, UA: NEGATIVE
Glucose, UA: NEGATIVE mg/dL
Ketones, POC UA: NEGATIVE mg/dL
Leukocytes, UA: NEGATIVE
Nitrite, UA: NEGATIVE
POC PROTEIN,UA: NEGATIVE
Spec Grav, UA: 1.01
Urobilinogen, UA: 0.2 U/dL
pH, UA: 5.5

## 2018-11-15 LAB — POCT GLYCOSYLATED HEMOGLOBIN (HGB A1C): Hemoglobin A1C: 6.9 % — AB (ref 4.0–5.6)

## 2018-11-15 LAB — GLUCOSE, POCT (MANUAL RESULT ENTRY): POC Glucose: 124 mg/dL — AB (ref 70–99)

## 2018-11-15 MED ORDER — TAMSULOSIN HCL 0.4 MG PO CAPS: 0.8000 mg | ORAL_CAPSULE | Freq: Every day | ORAL | 5 refills | Status: DC

## 2018-11-15 MED ORDER — FINASTERIDE 5 MG PO TABS: 5.0000 mg | ORAL_TABLET | Freq: Every day | ORAL | 5 refills | Status: DC

## 2018-11-15 MED ORDER — DOCUSATE SODIUM 100 MG PO CAPS: 100.0000 mg | ORAL_CAPSULE | Freq: Every day | ORAL | 5 refills | Status: DC | PRN

## 2018-11-15 MED ORDER — FLUTICASONE PROPIONATE 50 MCG/ACT NA SUSP: 2.0000 | Freq: Every day | NASAL | 6 refills | Status: DC

## 2018-11-15 MED ORDER — ALBUTEROL SULFATE HFA 108 (90 BASE) MCG/ACT IN AERS: 2.0000 | INHALATION_SPRAY | Freq: Four times a day (QID) | RESPIRATORY_TRACT | 99 refills | Status: DC | PRN

## 2018-11-15 MED ORDER — METFORMIN HCL 500 MG PO TABS: 500.0000 mg | ORAL_TABLET | Freq: Two times a day (BID) | ORAL | 5 refills | Status: DC

## 2018-11-15 MED ORDER — FLUTICASONE-SALMETEROL 250-50 MCG/DOSE IN AEPB: 1.0000 | INHALATION_SPRAY | Freq: Two times a day (BID) | RESPIRATORY_TRACT | 0 refills | Status: DC

## 2018-11-15 MED ORDER — LISINOPRIL-HYDROCHLOROTHIAZIDE 10-12.5 MG PO TABS: 1.0000 | ORAL_TABLET | Freq: Every day | ORAL | 1 refills | Status: DC

## 2018-11-15 MED ORDER — SULFAMETHOXAZOLE-TRIMETHOPRIM 800-160 MG PO TABS: 1.0000 | ORAL_TABLET | Freq: Two times a day (BID) | ORAL | 0 refills | Status: AC

## 2018-11-15 MED FILL — SULFAMETHOXAZOLE-TMP DS TAB: 800-160 | 7 days supply | Qty: 14 | Fill #0

## 2018-11-15 MED FILL — ALBUTEROL SULFATE HFA 108 (: 108 (90 BAS | 25 days supply | Qty: 18 | Fill #0

## 2018-11-15 MED FILL — ADVAIR 250/50 DISKUS: 250-50 | 30 days supply | Qty: 60 | Fill #0
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
## 2018-11-16 LAB — CBC WITH DIFFERENTIAL/PLATELET
Basophils Absolute: 0 x10E3/uL (ref 0.0–0.2)
Basos: 1 %
EOS (ABSOLUTE): 0.1 x10E3/uL (ref 0.0–0.4)
Eos: 1 %
Hematocrit: 47.2 % (ref 37.5–51.0)
Hemoglobin: 15.7 g/dL (ref 13.0–17.7)
Immature Grans (Abs): 0 x10E3/uL (ref 0.0–0.1)
Immature Granulocytes: 1 %
Lymphocytes Absolute: 2.3 x10E3/uL (ref 0.7–3.1)
Lymphs: 35 %
MCH: 30.3 pg (ref 26.6–33.0)
MCHC: 33.3 g/dL (ref 31.5–35.7)
MCV: 91 fL (ref 79–97)
Monocytes Absolute: 1 x10E3/uL — ABNORMAL HIGH (ref 0.1–0.9)
Monocytes: 15 %
Neutrophils Absolute: 3.2 x10E3/uL (ref 1.4–7.0)
Neutrophils: 47 %
Platelets: 222 x10E3/uL (ref 150–450)
RBC: 5.19 x10E6/uL (ref 4.14–5.80)
RDW: 13.8 % (ref 11.6–15.4)
WBC: 6.6 x10E3/uL (ref 3.4–10.8)

## 2018-11-16 LAB — BASIC METABOLIC PANEL WITH GFR
BUN/Creatinine Ratio: 16 (ref 10–24)
BUN: 13 mg/dL (ref 8–27)
CO2: 26 mmol/L (ref 20–29)
Calcium: 9.5 mg/dL (ref 8.6–10.2)
Chloride: 102 mmol/L (ref 96–106)
Creatinine, Ser: 0.83 mg/dL (ref 0.76–1.27)
GFR calc Af Amer: 101 mL/min/1.73
GFR calc non Af Amer: 87 mL/min/1.73
Glucose: 95 mg/dL (ref 65–99)
Potassium: 4 mmol/L (ref 3.5–5.2)
Sodium: 141 mmol/L (ref 134–144)

## 2018-11-16 LAB — TSH: TSH: 1.24 u[IU]/mL (ref 0.450–4.500)

## 2018-11-23 ENCOUNTER — Telehealth: Payer: Self-pay | Admitting: Emergency Medicine

## 2018-11-23 ENCOUNTER — Telehealth: Payer: Self-pay | Admitting: Internal Medicine

## 2018-11-23 NOTE — Telephone Encounter (Signed)
Nurse called the patient's home phone number but received no answer and message was left on the voicemail for the patient to call back.  Return phone number given. 

## 2018-11-23 NOTE — Telephone Encounter (Addendum)
Pts daughter called back, she would like to go over her results...please follow up

## 2018-11-30 NOTE — Telephone Encounter (Signed)
Pt name and DOB verified. Patient aware of results and result note per Dr. Chapman Fitch.  Pt states he feels better. He has reached out to the " prostate doctor" and they will call him for an appointment.

## 2018-12-09 MED FILL — TAMSULOSIN HCL 0.4 MG CAP: 0.4 | 30 days supply | Qty: 60 | Fill #1

## 2018-12-09 MED FILL — metFORMIN HCL 500 MG TABS: 500 | 30 days supply | Qty: 60 | Fill #1

## 2018-12-09 MED FILL — ATORVASTATIN 20 MG TABLET: 20 | 30 days supply | Qty: 30 | Fill #1

## 2018-12-09 MED FILL — LISINOPRIL-HCTZ 10-12.5 MG: 10-12.5 | 30 days supply | Qty: 30 | Fill #1

## 2018-12-09 MED FILL — FINASTERIDE 5 MG TABLET: 5 | 30 days supply | Qty: 30 | Fill #1

## 2018-12-13 ENCOUNTER — Other Ambulatory Visit: Payer: Self-pay | Admitting: Family Medicine

## 2018-12-13 DIAGNOSIS — J9801 Acute bronchospasm: Secondary | ICD-10-CM

## 2018-12-13 MED FILL — ALBUTEROL SULFATE HFA 108 (: 108 (90 BAS | 25 days supply | Qty: 18 | Fill #1

## 2018-12-14 MED FILL — ADVAIR 250/50 DISKUS: 250-50 | 30 days supply | Qty: 60 | Fill #0

## 2019-01-12 MED FILL — FINASTERIDE 5 MG TABLET: 5 | 30 days supply | Qty: 30 | Fill #2

## 2019-01-12 MED FILL — ATORVASTATIN 20 MG TABLET: 20 | 30 days supply | Qty: 30 | Fill #2

## 2019-01-12 MED FILL — TAMSULOSIN HCL 0.4 MG CAP: 0.4 | 30 days supply | Qty: 60 | Fill #2

## 2019-01-12 MED FILL — metFORMIN HCL 500 MG TABS: 500 | 30 days supply | Qty: 60 | Fill #2

## 2019-01-12 MED FILL — LISINOPRIL-HCTZ 10-12.5 MG: 10-12.5 | 30 days supply | Qty: 30 | Fill #2

## 2019-02-17 MED FILL — ATORVASTATIN CALCIUM 20 MG: 20 | 30 days supply | Qty: 30 | Fill #3

## 2019-02-17 MED FILL — FINASTERIDE 5 MG TABLET: 5 | 30 days supply | Qty: 30 | Fill #3

## 2019-02-17 MED FILL — LISINOPRIL-HCTZ 10-12.5 MG: 10-12.5 | 30 days supply | Qty: 30 | Fill #3

## 2019-02-17 MED FILL — TAMSULOSIN HCL 0.4 MG CAP: 0.4 | 30 days supply | Qty: 60 | Fill #3

## 2019-02-17 MED FILL — metFORMIN HCL 500 MG TABS: 500 | 30 days supply | Qty: 60 | Fill #3

## 2019-02-28 ENCOUNTER — Ambulatory Visit: Payer: Medicare Other | Admitting: Internal Medicine

## 2019-03-18 MED FILL — ALBUTEROL SULFATE HFA 108 (: 108 (90 BAS | 25 days supply | Qty: 18 | Fill #2

## 2019-03-18 MED FILL — LISINOPRIL-HCTZ 10-12.5 MG: 10-12.5 | 30 days supply | Qty: 30 | Fill #4

## 2019-03-18 MED FILL — TAMSULOSIN HCL 0.4 MG CAP: 0.4 | 30 days supply | Qty: 60 | Fill #4

## 2019-03-18 MED FILL — ATORVASTATIN CALCIUM 20 MG: 20 | 30 days supply | Qty: 30 | Fill #4

## 2019-03-18 MED FILL — metFORMIN HCL 500 MG TABS: 500 | 30 days supply | Qty: 60 | Fill #4

## 2019-03-18 MED FILL — FINASTERIDE 5 MG TABLET: 5 | 30 days supply | Qty: 30 | Fill #4

## 2019-03-18 MED FILL — ADVAIR 250/50 DISKUS: 250-50 | 30 days supply | Qty: 60 | Fill #1

## 2019-04-20 ENCOUNTER — Other Ambulatory Visit: Payer: Self-pay | Admitting: Physician Assistant

## 2019-04-20 DIAGNOSIS — E1169 Type 2 diabetes mellitus with other specified complication: Secondary | ICD-10-CM

## 2019-04-20 MED FILL — metFORMIN HCL 500 MG TABS: 500 | 30 days supply | Qty: 60 | Fill #0

## 2019-04-20 MED FILL — TAMSULOSIN HCL 0.4 MG CAP: 0.4 | 30 days supply | Qty: 60 | Fill #0

## 2019-04-20 MED FILL — LISINOPRIL-HCTZ 10-12.5 MG: 10-12.5 | 30 days supply | Qty: 30 | Fill #0

## 2019-04-20 MED FILL — FINASTERIDE 5 MG TABLET: 5 | 30 days supply | Qty: 30 | Fill #0

## 2019-05-23 ENCOUNTER — Telehealth: Payer: Self-pay | Admitting: Internal Medicine

## 2019-05-23 NOTE — Telephone Encounter (Signed)
Patients Daughter called requesting an oxymachine. she states pt is in [place] and needs a prescription. Af

## 2019-05-24 MED FILL — FINASTERIDE 5 MG TABLET: 5 | 30 days supply | Qty: 30 | Fill #1

## 2019-05-24 MED FILL — LISINOPRIL-HCTZ 10-12.5 MG: 10-12.5 | 30 days supply | Qty: 30 | Fill #1

## 2019-05-24 MED FILL — TAMSULOSIN HCL 0.4 MG CAP: 0.4 | 30 days supply | Qty: 60 | Fill #1

## 2019-05-24 MED FILL — metFORMIN HCL 500 MG TABS: 500 | 30 days supply | Qty: 60 | Fill #1

## 2019-05-24 MED FILL — ADVAIR 250/50 DISKUS: 250-50 | 30 days supply | Qty: 60 | Fill #2

## 2019-05-24 MED FILL — ALBUTEROL SULFATE HFA 108 (: 108 (90 BAS | 25 days supply | Qty: 18 | Fill #3

## 2019-06-24 MED FILL — metFORMIN HCL 500 MG TABS: 500 | 30 days supply | Qty: 60 | Fill #2

## 2019-06-24 MED FILL — FINASTERIDE 5 MG TABLET: 5 | 30 days supply | Qty: 30 | Fill #2

## 2019-06-24 MED FILL — ALBUTEROL SULFATE HFA 108 (: 108 (90 BAS | 25 days supply | Qty: 18 | Fill #4

## 2019-06-24 MED FILL — TAMSULOSIN HCL 0.4 MG CAP: 0.4 | 30 days supply | Qty: 60 | Fill #2

## 2019-06-24 MED FILL — LISINOPRIL-HCTZ 10-12.5 MG: 10-12.5 | 30 days supply | Qty: 30 | Fill #2

## 2019-07-08 MED FILL — FINASTERIDE 5 MG TABLET: 5 | 30 days supply | Qty: 30 | Fill #2

## 2019-07-28 MED FILL — LISINOPRIL-HCTZ 10-12.5 MG: 10-12.5 | 30 days supply | Qty: 30 | Fill #3

## 2019-07-28 MED FILL — metFORMIN HCL 500 MG TABS: 500 | 30 days supply | Qty: 60 | Fill #3

## 2019-07-28 MED FILL — TAMSULOSIN HCL 0.4 MG CAP: 0.4 | 30 days supply | Qty: 60 | Fill #3

## 2019-07-28 MED FILL — ALBUTEROL SULFATE HFA 108 (: 108 (90 BAS | 25 days supply | Qty: 18 | Fill #5

## 2019-08-26 MED FILL — METFORMIN HCL 500 MG TABS: 500 | 30 days supply | Qty: 60 | Fill #4

## 2019-08-26 MED FILL — TAMSULOSIN HCL 0.4 MG CAP: 0.4 | 30 days supply | Qty: 60 | Fill #4

## 2019-08-26 MED FILL — FINASTERIDE 5 MG TABLET: 5 | 30 days supply | Qty: 30 | Fill #3

## 2019-08-26 MED FILL — ALBUTEROL SULFATE HFA 108 (: 108 (90 BAS | 25 days supply | Qty: 18 | Fill #6

## 2019-08-26 MED FILL — LISINOPRIL-HCTZ 10-12.5 MG: 10-12.5 | 30 days supply | Qty: 30 | Fill #4

## 2019-09-20 MED FILL — ALBUTEROL SULFATE HFA 108 (: 108 (90 BAS | 25 days supply | Qty: 18 | Fill #7

## 2019-09-20 MED FILL — LISINOPRIL-HCTZ 10-12.5 MG: 10-12.5 | 30 days supply | Qty: 30 | Fill #5

## 2019-09-20 MED FILL — FINASTERIDE 5 MG TABLET: 5 | 30 days supply | Qty: 30 | Fill #4

## 2019-09-20 MED FILL — METFORMIN HCL 500 MG TABS: 500 | 30 days supply | Qty: 60 | Fill #5

## 2019-09-20 MED FILL — TAMSULOSIN HCL 0.4 MG CAP: 0.4 | 30 days supply | Qty: 60 | Fill #5

## 2019-11-04 ENCOUNTER — Other Ambulatory Visit: Payer: Self-pay | Admitting: Family Medicine

## 2019-11-04 DIAGNOSIS — N400 Enlarged prostate without lower urinary tract symptoms: Secondary | ICD-10-CM

## 2019-11-04 DIAGNOSIS — E1169 Type 2 diabetes mellitus with other specified complication: Secondary | ICD-10-CM

## 2019-11-04 DIAGNOSIS — I10 Essential (primary) hypertension: Secondary | ICD-10-CM

## 2019-11-04 MED FILL — FINASTERIDE 5 MG TABLET: 5 | 30 days supply | Qty: 30 | Fill #5

## 2019-11-04 MED FILL — TAMSULOSIN HCL 0.4 MG CAP: 0.4 | 30 days supply | Qty: 60 | Fill #0

## 2019-11-04 MED FILL — ALBUTEROL SULFATE HFA 108 (: 108 (90 BAS | 25 days supply | Qty: 18 | Fill #8

## 2019-11-04 MED FILL — METFORMIN HCL 500 MG TABS: 500 | 30 days supply | Qty: 60 | Fill #0

## 2019-11-04 MED FILL — LISINOPRIL-HCTZ 10-12.5 MG: 10-12.5 | 30 days supply | Qty: 30 | Fill #0

## 2019-11-13 ENCOUNTER — Other Ambulatory Visit: Payer: Self-pay

## 2019-11-13 ENCOUNTER — Ambulatory Visit
Admission: EM | Admit: 2019-11-13 | Discharge: 2019-11-13 | Disposition: A | Discharge status: Home / Self Care | Payer: Self-pay | Attending: Physician Assistant | Admitting: Physician Assistant

## 2019-11-13 DIAGNOSIS — M545 Low back pain, unspecified: Secondary | ICD-10-CM

## 2019-11-13 MED ORDER — NAPROXEN 500 MG PO TABS: 500.0000 mg | ORAL_TABLET | Freq: Two times a day (BID) | ORAL | 0 refills | Status: DC

## 2019-11-13 MED ORDER — TIZANIDINE HCL 2 MG PO TABS: 2.0000 mg | ORAL_TABLET | Freq: Three times a day (TID) | ORAL | 0 refills | Status: DC | PRN
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
## 2019-11-14 MED FILL — tiZANidine HCL 2 MG TABS: 2 | 5 days supply | Qty: 15 | Fill #0

## 2019-11-14 MED FILL — NAPROXEN 500 MG TABLET: 500 | 15 days supply | Qty: 30 | Fill #0

## 2019-11-17 ENCOUNTER — Other Ambulatory Visit: Payer: Self-pay | Admitting: Physician Assistant

## 2019-11-17 ENCOUNTER — Ambulatory Visit: Payer: Medicare Other | Attending: Physician Assistant | Admitting: Physician Assistant

## 2019-11-17 ENCOUNTER — Other Ambulatory Visit: Payer: Self-pay

## 2019-11-17 DIAGNOSIS — N400 Enlarged prostate without lower urinary tract symptoms: Secondary | ICD-10-CM | POA: Diagnosis not present

## 2019-11-17 DIAGNOSIS — E1169 Type 2 diabetes mellitus with other specified complication: Secondary | ICD-10-CM | POA: Diagnosis not present

## 2019-11-17 DIAGNOSIS — J9801 Acute bronchospasm: Secondary | ICD-10-CM

## 2019-11-17 DIAGNOSIS — I10 Essential (primary) hypertension: Secondary | ICD-10-CM | POA: Diagnosis not present

## 2019-11-17 DIAGNOSIS — E669 Obesity, unspecified: Secondary | ICD-10-CM

## 2019-11-17 MED ORDER — TAMSULOSIN HCL 0.4 MG PO CAPS: 0.8000 mg | ORAL_CAPSULE | Freq: Every day | ORAL | 2 refills | Status: DC

## 2019-11-17 MED ORDER — FINASTERIDE 5 MG PO TABS: 5.0000 mg | ORAL_TABLET | Freq: Every day | ORAL | 5 refills | Status: DC

## 2019-11-17 MED ORDER — LISINOPRIL-HYDROCHLOROTHIAZIDE 10-12.5 MG PO TABS: 1.0000 | ORAL_TABLET | Freq: Every day | ORAL | 2 refills | Status: DC

## 2019-11-17 MED ORDER — METFORMIN HCL 500 MG PO TABS: 500.0000 mg | ORAL_TABLET | Freq: Two times a day (BID) | ORAL | 2 refills | Status: DC

## 2019-11-17 MED ORDER — ATORVASTATIN CALCIUM 20 MG PO TABS: 20.0000 mg | ORAL_TABLET | Freq: Every day | ORAL | 3 refills | Status: DC

## 2019-11-17 MED ORDER — FLUTICASONE-SALMETEROL 250-50 MCG/DOSE IN AEPB: INHALATION_SPRAY | RESPIRATORY_TRACT | 2 refills | Status: DC

## 2019-11-17 MED FILL — ADVAIR 250/50 DISKUS: 250-50 | 30 days supply | Qty: 60 | Fill #0

## 2019-11-17 MED FILL — ATORVASTATIN CALCIUM 20 MG: 20 | 90 days supply | Qty: 90 | Fill #0
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
## 2019-11-22 ENCOUNTER — Ambulatory Visit: Payer: Medicare Other | Attending: Internal Medicine

## 2019-11-22 ENCOUNTER — Other Ambulatory Visit: Payer: Self-pay

## 2019-11-22 DIAGNOSIS — E669 Obesity, unspecified: Secondary | ICD-10-CM | POA: Diagnosis not present

## 2019-11-22 DIAGNOSIS — I10 Essential (primary) hypertension: Secondary | ICD-10-CM | POA: Diagnosis not present

## 2019-11-22 DIAGNOSIS — E1169 Type 2 diabetes mellitus with other specified complication: Secondary | ICD-10-CM

## 2019-11-23 ENCOUNTER — Other Ambulatory Visit: Payer: Self-pay | Admitting: Physician Assistant

## 2019-11-23 LAB — COMPREHENSIVE METABOLIC PANEL
ALT: 13 IU/L (ref 0–44)
AST: 16 IU/L (ref 0–40)
Albumin/Globulin Ratio: 1.5 (ref 1.2–2.2)
Albumin: 4.3 g/dL (ref 3.7–4.7)
Alkaline Phosphatase: 120 IU/L (ref 48–121)
BUN/Creatinine Ratio: 13 (ref 10–24)
BUN: 12 mg/dL (ref 8–27)
Bilirubin Total: 0.3 mg/dL (ref 0.0–1.2)
CO2: 25 mmol/L (ref 20–29)
Calcium: 9.6 mg/dL (ref 8.6–10.2)
Chloride: 102 mmol/L (ref 96–106)
Creatinine, Ser: 0.93 mg/dL (ref 0.76–1.27)
GFR calc Af Amer: 93 mL/min/{1.73_m2} (ref >59)
GFR calc non Af Amer: 81 mL/min/{1.73_m2} (ref >59)
Globulin, Total: 2.8 g/dL (ref 1.5–4.5)
Glucose: 105 mg/dL — ABNORMAL HIGH (ref 65–99)
Potassium: 4.5 mmol/L (ref 3.5–5.2)
Sodium: 142 mmol/L (ref 134–144)
Total Protein: 7.1 g/dL (ref 6.0–8.5)

## 2019-11-23 LAB — LIPID PANEL
Chol/HDL Ratio: 4.2 ratio (ref 0.0–5.0)
Cholesterol, Total: 237 mg/dL — ABNORMAL HIGH (ref 100–199)
HDL: 56 mg/dL (ref >39)
LDL Chol Calc (NIH): 147 mg/dL — ABNORMAL HIGH (ref 0–99)
Triglycerides: 191 mg/dL — ABNORMAL HIGH (ref 0–149)
VLDL Cholesterol Cal: 34 mg/dL (ref 5–40)

## 2019-11-23 LAB — CBC WITH DIFFERENTIAL/PLATELET
Basophils Absolute: 0.1 10*3/uL (ref 0.0–0.2)
Basos: 1 %
EOS (ABSOLUTE): 0.2 10*3/uL (ref 0.0–0.4)
Eos: 3 %
Hematocrit: 45.8 % (ref 37.5–51.0)
Hemoglobin: 15.5 g/dL (ref 13.0–17.7)
Immature Grans (Abs): 0 10*3/uL (ref 0.0–0.1)
Immature Granulocytes: 0 %
Lymphocytes Absolute: 2.7 10*3/uL (ref 0.7–3.1)
Lymphs: 31 %
MCH: 31 pg (ref 26.6–33.0)
MCHC: 33.8 g/dL (ref 31.5–35.7)
MCV: 92 fL (ref 79–97)
Monocytes Absolute: 0.8 10*3/uL (ref 0.1–0.9)
Monocytes: 10 %
Neutrophils Absolute: 4.8 10*3/uL (ref 1.4–7.0)
Neutrophils: 55 %
Platelets: 225 10*3/uL (ref 150–450)
RBC: 5 x10E6/uL (ref 4.14–5.80)
RDW: 13.5 % (ref 11.6–15.4)
WBC: 8.5 10*3/uL (ref 3.4–10.8)

## 2019-11-23 LAB — HEMOGLOBIN A1C
Est. average glucose Bld gHb Est-mCnc: 131 mg/dL
Hgb A1c MFr Bld: 6.2 % — ABNORMAL HIGH (ref 4.8–5.6)

## 2019-11-23 MED ORDER — ATORVASTATIN CALCIUM 40 MG PO TABS
40.0000 mg | ORAL_TABLET | Freq: Every day | ORAL | 3 refills | Status: DC
Start: 2019-11-23 — End: 2020-02-20

## 2019-11-23 MED FILL — ATORVASTATIN CALCIUM 40 MG: 40 | 90 days supply | Qty: 90 | Fill #0

## 2019-12-01 MED FILL — FINASTERIDE 5 MG TABLET: 5 | 30 days supply | Qty: 30 | Fill #0

## 2019-12-01 MED FILL — TAMSULOSIN HCL 0.4 MG CAP: 0.4 | 30 days supply | Qty: 60 | Fill #0

## 2019-12-01 MED FILL — LISINOPRIL-HCTZ 10-12.5 MG: 10-12.5 | 30 days supply | Qty: 30 | Fill #0

## 2019-12-01 MED FILL — METFORMIN HCL 500 MG TABS: 500 | 30 days supply | Qty: 60 | Fill #0

## 2020-01-04 ENCOUNTER — Ambulatory Visit (INDEPENDENT_AMBULATORY_CARE_PROVIDER_SITE_OTHER): Payer: Medicare Other | Admitting: Cardiovascular Disease

## 2020-01-04 ENCOUNTER — Other Ambulatory Visit: Payer: Self-pay

## 2020-01-04 ENCOUNTER — Encounter: Payer: Self-pay | Admitting: Cardiovascular Disease

## 2020-01-04 VITALS — BP 110/70 | HR 67 | Ht 67.5 in | Wt 217.0 lb

## 2020-01-04 DIAGNOSIS — E785 Hyperlipidemia, unspecified: Secondary | ICD-10-CM | POA: Insufficient documentation

## 2020-01-04 DIAGNOSIS — I10 Essential (primary) hypertension: Secondary | ICD-10-CM | POA: Diagnosis not present

## 2020-01-04 DIAGNOSIS — E782 Mixed hyperlipidemia: Secondary | ICD-10-CM | POA: Diagnosis not present

## 2020-01-04 MED FILL — TAMSULOSIN HCL 0.4 MG CAP: 0.4 | 30 days supply | Qty: 60 | Fill #1

## 2020-01-04 MED FILL — LISINOPRIL-HCTZ 10-12.5 MG: 10-12.5 | 30 days supply | Qty: 30 | Fill #1

## 2020-01-04 MED FILL — METFORMIN HCL 500 MG TABS: 500 | 30 days supply | Qty: 60 | Fill #1

## 2020-01-04 MED FILL — FINASTERIDE 5 MG TABLET: 5 | 30 days supply | Qty: 30 | Fill #1

## 2020-01-04 NOTE — Assessment & Plan Note (Signed)
History of hyperlipidemia on statin therapy with lipid profile performed 11/22/2019 revealing total cholesterol 237, LDL of 147 and HDL 56.  We will consider raising his atorvastatin 80 mg at the next office visit.

## 2020-01-04 NOTE — Patient Instructions (Signed)
Medication Instructions:  °No Changes In Medications at this time.  °*If you need a refill on your cardiac medications before your next appointment, please call your pharmacy* ° ° °Lab Work: °None Ordered At This Time.  °If you have labs (blood work) drawn today and your tests are completely normal, you will receive your results only by: °• MyChart Message (if you have MyChart) OR °• A paper copy in the mail °If you have any lab test that is abnormal or we need to change your treatment, we will call you to review the results. ° ° °Testing/Procedures: °None Ordered At This Time.  ° ° °Follow-Up: °At [facility], you and your health needs are our priority.  As part of our continuing mission to provide you with exceptional heart care, we have created designated Provider Care Teams.  These Care Teams include your primary Cardiologist (physician) and Advanced Practice Providers (APPs -  Physician Assistants and Nurse Practitioners) who all work together to provide you with the care you need, when you need it. ° °We recommend signing up for the patient portal called "MyChart".  Sign up information is provided on this After Visit Summary.  MyChart is used to connect with patients for Virtual Visits (Telemedicine).  Patients are able to view lab/test results, encounter notes, upcoming appointments, etc.  Non-urgent messages can be sent to your provider as well.   °To learn more about what you can do with MyChart, go to [email or web address].   ° °Your next appointment:   °6 month(s) ° °The format for your next appointment:   °In Person ° °Provider:   °Jonathan Berry, MD °

## 2020-01-04 NOTE — Progress Notes (Signed)
01/04/2020 Louis Peterson   11-09-1945  [id number]  Primary Physician Marcine Matar, MD Primary Cardiologist: Runell Gess MD Louis Peterson, Louis Peterson, [place]  HPI:  Louis Peterson is a 74 y.o. moderately overweight married Latino male father of 6 with too many to count grandchildren referred by Louis Co, PA-C for cardiovascular valuation because of atypical chest pain.  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  His risk factors include treated hypertension, diabetes and hyperlipidemia.  There is no family history of heart disease.  Never had heart attack or stroke.  Had one episode of chest pain 4 months ago in the shower that lasted for a minute and gets rare atypical sharp chest pain which sounds nonanginal.   Current Meds  Medication Sig  . atorvastatin (LIPITOR) 40 MG tablet Take 1 tablet (40 mg total) by mouth daily.  Marland Kitchen docusate sodium (COLACE) 100 MG capsule Take 1 capsule (100 mg total) by mouth daily as needed for mild constipation.  Clinical research associate Bandages & Supports (MEDICAL COMPRESSION SOCKS) MISC 1 each by Does not apply route daily. 30 mm Hg pressure  . finasteride (PROSCAR) 5 MG tablet Take 1 tablet (5 mg total) by mouth daily.  . fluticasone (FLONASE) 50 MCG/ACT nasal spray Place 2 sprays into both nostrils daily.  . Fluticasone-Salmeterol (ADVAIR DISKUS) 250-50 MCG/DOSE AEPB INHALE 1 PUFF INTO THE LUNGS 2 (TWO) TIMES DAILY.  Marland Kitchen lisinopril-hydrochlorothiazide (ZESTORETIC) 10-12.5 MG tablet Take 1 tablet by mouth daily.  . metFORMIN (GLUCOPHAGE) 500 MG tablet Take 1 tablet (500 mg total) by mouth 2 (two) times daily with a meal.  . naproxen (NAPROSYN) 500 MG tablet Take 1 tablet (500 mg total) by mouth 2 (two) times daily.  . tamsulosin (FLOMAX) 0.4 MG CAPS capsule Take 2 capsules (0.8 mg total) by mouth daily.  Marland Kitchen tiZANidine (ZANAFLEX) 2 MG tablet Take 1 tablet (2 mg total) by mouth every 8 (eight) hours as needed for muscle spasms.     No Known  Allergies  Social History   Socioeconomic History  . Marital status: Married    Spouse name: Not on file  . Number of children: Not on file  . Years of education: Not on file  . Highest education level: Not on file  Occupational History  . Not on file  Tobacco Use  . Smoking status: Never Smoker  . Smokeless tobacco: Never Used  Substance and Sexual Activity  . Alcohol use: No  . Drug use: No  . Sexual activity: Not on file  Other Topics Concern  . Not on file  Social History Narrative  . Not on file   Social Determinants of Health   Financial Resource Strain:   . Difficulty of Paying Living Expenses: Not on file  Food Insecurity:   . Worried About Programme researcher, broadcasting/film/video in the Last Year: Not on file  . Ran Out of Food in the Last Year: Not on file  Transportation Needs:   . Lack of Transportation (Medical): Not on file  . Lack of Transportation (Non-Medical): Not on file  Physical Activity:   . Days of Exercise per Week: Not on file  . Minutes of Exercise per Session: Not on file  Stress:   . Feeling of Stress : Not on file  Social Connections:   . Frequency of Communication with Friends and Family: Not on file  . Frequency of Social Gatherings with Friends and Family: Not on file  . Attends Religious Services: Not on  file  . Active Member of Clubs or Organizations: Not on file  . Attends Banker Meetings: Not on file  . Marital Status: Not on file  Intimate Partner Violence:   . Fear of Current or Ex-Partner: Not on file  . Emotionally Abused: Not on file  . Physically Abused: Not on file  . Sexually Abused: Not on file     Review of Systems: General: negative for chills, fever, night sweats or weight changes.  Cardiovascular: negative for chest pain, dyspnea on exertion, edema, orthopnea, palpitations, paroxysmal nocturnal dyspnea or shortness of breath Dermatological: negative for rash Respiratory: negative for cough or wheezing Urologic:  negative for hematuria Abdominal: negative for nausea, vomiting, diarrhea, bright red blood per rectum, melena, or hematemesis Neurologic: negative for visual changes, syncope, or dizziness All other systems reviewed and are otherwise negative except as noted above.    Blood pressure 110/70, pulse 67, height 5' 7.5" (1.715 m), weight 217 lb (98.4 kg).  General appearance: alert and no distress Neck: no adenopathy, no carotid bruit, no JVD, supple, symmetrical, trachea midline and thyroid not enlarged, symmetric, no tenderness/mass/nodules Lungs: clear to auscultation bilaterally Heart: regular rate and rhythm, S1, S2 normal, no murmur, click, rub or gallop Extremities: extremities normal, atraumatic, no cyanosis or edema Pulses: 2+ and symmetric Skin: Skin color, texture, turgor normal. No rashes or lesions Neurologic: Alert and oriented X 3, normal strength and tone. Normal symmetric reflexes. Normal coordination and gait  EKG sinus rhythm at 67 without ST or T wave changes.  Personally reviewed this EKG.  ASSESSMENT AND PLAN:   Essential hypertension, benign History of essential potential blood pressure measured today 110/70.  He is on lisinopril, and hydrochlorothiazide.  Hyperlipidemia History of hyperlipidemia on statin therapy with lipid profile performed 11/22/2019 revealing total cholesterol 237, LDL of 147 and HDL 56.  We will consider raising his atorvastatin 80 mg at the next office visit.      Runell Gess MD FACP,FACC,FAHA, [facility] 01/04/2020 3:22 PM

## 2020-01-04 NOTE — Assessment & Plan Note (Signed)
History of essential potential blood pressure measured today 110/70.  He is on lisinopril, and hydrochlorothiazide.

## 2020-02-03 MED FILL — LISINOPRIL-HCTZ 10-12.5 MG: 10-12.5 | 30 days supply | Qty: 30 | Fill #2

## 2020-02-03 MED FILL — FINASTERIDE 5 MG TABLET: 5 | 30 days supply | Qty: 30 | Fill #2

## 2020-02-03 MED FILL — TAMSULOSIN HCL 0.4 MG CAP: 0.4 | 30 days supply | Qty: 60 | Fill #2

## 2020-02-03 MED FILL — METFORMIN HCL 500 MG TABS: 500 | 30 days supply | Qty: 60 | Fill #2

## 2020-02-20 ENCOUNTER — Encounter: Payer: Self-pay | Admitting: Internal Medicine

## 2020-02-20 ENCOUNTER — Ambulatory Visit: Payer: Medicare Other | Attending: Internal Medicine | Admitting: Internal Medicine

## 2020-02-20 ENCOUNTER — Ambulatory Visit: Payer: Medicare Other | Attending: Internal Medicine | Admitting: Pharmacist

## 2020-02-20 ENCOUNTER — Other Ambulatory Visit: Payer: Self-pay

## 2020-02-20 ENCOUNTER — Other Ambulatory Visit: Payer: Self-pay | Admitting: Internal Medicine

## 2020-02-20 VITALS — BP 146/79 | HR 75 | Resp 16 | Wt 220.2 lb

## 2020-02-20 DIAGNOSIS — Z7984 Long term (current) use of oral hypoglycemic drugs: Secondary | ICD-10-CM | POA: Insufficient documentation

## 2020-02-20 DIAGNOSIS — E1169 Type 2 diabetes mellitus with other specified complication: Secondary | ICD-10-CM | POA: Diagnosis not present

## 2020-02-20 DIAGNOSIS — I10 Essential (primary) hypertension: Secondary | ICD-10-CM | POA: Diagnosis not present

## 2020-02-20 DIAGNOSIS — Z1211 Encounter for screening for malignant neoplasm of colon: Secondary | ICD-10-CM | POA: Diagnosis not present

## 2020-02-20 DIAGNOSIS — I872 Venous insufficiency (chronic) (peripheral): Secondary | ICD-10-CM | POA: Diagnosis not present

## 2020-02-20 DIAGNOSIS — Z8249 Family history of ischemic heart disease and other diseases of the circulatory system: Secondary | ICD-10-CM | POA: Diagnosis not present

## 2020-02-20 DIAGNOSIS — Z87828 Personal history of other (healed) physical injury and trauma: Secondary | ICD-10-CM | POA: Insufficient documentation

## 2020-02-20 DIAGNOSIS — E785 Hyperlipidemia, unspecified: Secondary | ICD-10-CM | POA: Insufficient documentation

## 2020-02-20 DIAGNOSIS — Z79899 Other long term (current) drug therapy: Secondary | ICD-10-CM | POA: Insufficient documentation

## 2020-02-20 DIAGNOSIS — Z791 Long term (current) use of non-steroidal anti-inflammatories (NSAID): Secondary | ICD-10-CM | POA: Insufficient documentation

## 2020-02-20 DIAGNOSIS — E669 Obesity, unspecified: Secondary | ICD-10-CM | POA: Diagnosis not present

## 2020-02-20 DIAGNOSIS — N400 Enlarged prostate without lower urinary tract symptoms: Secondary | ICD-10-CM | POA: Diagnosis not present

## 2020-02-20 DIAGNOSIS — Z23 Encounter for immunization: Secondary | ICD-10-CM

## 2020-02-20 DIAGNOSIS — J029 Acute pharyngitis, unspecified: Secondary | ICD-10-CM

## 2020-02-20 DIAGNOSIS — K5909 Other constipation: Secondary | ICD-10-CM | POA: Insufficient documentation

## 2020-02-20 DIAGNOSIS — N419 Inflammatory disease of prostate, unspecified: Secondary | ICD-10-CM | POA: Diagnosis not present

## 2020-02-20 DIAGNOSIS — Z7951 Long term (current) use of inhaled steroids: Secondary | ICD-10-CM | POA: Diagnosis not present

## 2020-02-20 DIAGNOSIS — Z6833 Body mass index (BMI) 33.0-33.9, adult: Secondary | ICD-10-CM | POA: Insufficient documentation

## 2020-02-20 DIAGNOSIS — Z833 Family history of diabetes mellitus: Secondary | ICD-10-CM | POA: Insufficient documentation

## 2020-02-20 LAB — GLUCOSE, POCT (MANUAL RESULT ENTRY): POC Glucose: 117 mg/dl — AB (ref 70–99)

## 2020-02-20 MED ORDER — TAMSULOSIN HCL 0.4 MG PO CAPS: 0.8000 mg | ORAL_CAPSULE | Freq: Every day | ORAL | 1 refills | Status: DC

## 2020-02-20 MED ORDER — ACCU-CHEK SOFTCLIX LANCETS MISC: 2 refills | Status: DC

## 2020-02-20 MED ORDER — LISINOPRIL-HYDROCHLOROTHIAZIDE 10-12.5 MG PO TABS: 1.0000 | ORAL_TABLET | Freq: Every day | ORAL | 2 refills | Status: DC

## 2020-02-20 MED ORDER — ATORVASTATIN CALCIUM 40 MG PO TABS: 40.0000 mg | ORAL_TABLET | Freq: Every day | ORAL | 3 refills | Status: DC

## 2020-02-20 MED ORDER — ACCU-CHEK GUIDE ME W/DEVICE KIT: PACK | 0 refills | Status: DC

## 2020-02-20 MED ORDER — FINASTERIDE 5 MG PO TABS: 5.0000 mg | ORAL_TABLET | Freq: Every day | ORAL | 1 refills | Status: DC

## 2020-02-20 MED ORDER — ACCU-CHEK GUIDE VI STRP: ORAL_STRIP | 2 refills | Status: DC

## 2020-02-20 MED FILL — ATORVASTATIN CALCIUM 40 MG: 40 | 90 days supply | Qty: 90 | Fill #0
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
## 2020-02-20 NOTE — Patient Instructions (Signed)
Influenza Virus Vaccine injection (Fluarix) °¿Qué es este medicamento? °La VACUNA ANTIGRIPAL ayuda a disminuir el riesgo de contraer la influenza, también conocida como la gripe. La vacuna solo ayuda a protegerle contra algunas cepas de influenza. Esta vacuna no ayuda a reducir el riesgo de contraer influenza pandémica H1N1. °Este medicamento puede ser utilizado para otros usos; si tiene alguna pregunta consulte con su proveedor de atención médica o con su farmacéutico. °MARCAS COMUNES: Fluarix, Fluzone °¿Qué le debo informar a mi profesional de la salud antes de tomar este medicamento? °Necesita saber si usted presenta alguno de los siguientes problemas o situaciones: °· trastorno de sangrado como hemofilia °· fiebre o infección °· síndrome de Guillain-Barre u otros problemas neurológicos °· problemas del sistema inmunológico °· infección por el virus de la inmunodeficiencia humana (VIH) o SIDA °· niveles bajos de plaquetas en la sangre °· esclerosis múltiple °· una reacción alérgica o inusual a las vacunas antigripales, a los huevos, proteínas de pollo, al látex, a la gentamicina, a otros medicamentos, alimentos, colorantes o conservantes °· si está embarazada o buscando quedar embarazada °· si está amamantando a un bebé °¿Cómo debo utilizar este medicamento? °Esta vacuna se administra mediante inyección por vía intramuscular. Lo administra un profesional de la salud. °Recibirá una copia de información escrita sobre la vacuna antes de cada vacuna. Asegúrese de leer este folleto cada vez cuidadosamente. Este folleto puede cambiar con frecuencia. °Hable con su pediatra para informarse acerca del uso de este medicamento en niños. Puede requerir atención especial. °Sobredosis: Póngase en contacto inmediatamente con un centro toxicológico o una sala de urgencia si usted cree que haya tomado demasiado medicamento. °ATENCIÓN: Este medicamento es solo para usted. No comparta este medicamento con nadie. °¿Qué sucede si me  olvido de una dosis? °No se aplica en este caso. °¿Qué puede interactuar con este medicamento? °· quimioterapia o radioterapia °· medicamentos que suprimen el sistema inmunológico, tales como etanercept, anakinra, infliximab y adalimumab °· medicamentos que tratan o previenen coágulos sanguíneos, como warfarina °· fenitoína °· medicamentos esteroideos, como la prednisona o la cortisona °· teofilina °· vacunas °Puede ser que esta lista no menciona todas las posibles interacciones. Informe a su profesional de la salud de todos los productos a base de hierbas, medicamentos de venta libre o suplementos nutritivos que esté tomando. Si usted fuma, consume bebidas alcohólicas o si utiliza drogas ilegales, indíqueselo también a su profesional de la salud. Algunas sustancias pueden interactuar con su medicamento. °¿A qué debo estar atento al usar este medicamento? °Informe a su médico o a su profesional de la salud sobre todos los efectos secundarios que persistan después de 3 días. Llame a su proveedor de atención médica si se presentan síntomas inusuales dentro de las 6 semanas posteriores a la vacunación. °Es posible que todavía pueda contraer la gripe, pero la enfermedad no será tan fuerte como normalmente. No puede contraer la gripe de esta vacuna. La vacuna antigripal no le protege contra resfríos u otras enfermedades que pueden causar fiebre. Debe vacunarse cada año. °¿Qué efectos secundarios puedo tener al utilizar este medicamento? °Efectos secundarios que debe informar a su médico o a su profesional de la salud tan pronto como sea posible: °· reacciones alérgicas como erupción cutánea, picazón o urticarias, hinchazón de la cara, labios o lengua °Efectos secundarios que, por lo general, no requieren atención médica (debe informarlos a su médico o a su profesional de la salud si persisten o si son molestos): °· fiebre °· dolor de cabeza °· molestias y dolores musculares °·   dolor, sensibilidad, enrojecimiento o  hinchazón en el lugar de la inyección °· cansancio o debilidad °Puede ser que esta lista no menciona todos los posibles efectos secundarios. Comuníquese a su médico por asesoramiento médico sobre los efectos secundarios. Usted puede informar los efectos secundarios a la FDA por teléfono al 1-800-FDA-1088. °¿Dónde debo guardar mi medicina? °Esta vacuna se administra solamente en clínicas, farmacias, consultorio médico u otro consultorio de un profesional de la salud y no necesitará guardarlo en su domicilio. °ATENCIÓN: Este folleto es un resumen. Puede ser que no cubra toda la posible información. Si usted tiene preguntas acerca de esta medicina, consulte con su médico, su farmacéutico o su profesional de la salud. °© 2020 Elsevier/Gold Standard (2009-10-23 15:31:40) ° °

## 2020-02-20 NOTE — Addendum Note (Signed)
Addended by: Lois Huxley, Jeannett Senior L on: 02/20/2020 03:46 PM   Modules accepted: Orders

## 2020-02-20 NOTE — Progress Notes (Addendum)
1Patient presents for vaccination against influenza per orders of Dr. Laural Benes. Consent given. Counseling provided. No contraindications exists. Vaccine administered without incident.   Butch Penny, PharmD, CPP Clinical Pharmacist [facility] [phone]

## 2020-02-21 DIAGNOSIS — Z1211 Encounter for screening for malignant neoplasm of colon: Secondary | ICD-10-CM | POA: Diagnosis not present

## 2020-02-23 LAB — FECAL OCCULT BLOOD, IMMUNOCHEMICAL: Fecal Occult Bld: NEGATIVE

## 2020-02-23 NOTE — Progress Notes (Signed)
Spanish normal result letter generated and mailed to address on file. 

## 2020-02-24 ENCOUNTER — Ambulatory Visit
Admission: EM | Admit: 2020-02-24 | Discharge: 2020-02-24 | Disposition: A | Discharge status: Home / Self Care | Payer: Medicare Other | Attending: Emergency Medicine | Admitting: Emergency Medicine

## 2020-02-24 DIAGNOSIS — R300 Dysuria: Secondary | ICD-10-CM

## 2020-02-24 DIAGNOSIS — J309 Allergic rhinitis, unspecified: Secondary | ICD-10-CM

## 2020-02-24 DIAGNOSIS — R0981 Nasal congestion: Secondary | ICD-10-CM

## 2020-02-24 LAB — POCT URINALYSIS DIP (MANUAL ENTRY)
Bilirubin, UA: NEGATIVE
Blood, UA: NEGATIVE
Glucose, UA: NEGATIVE mg/dL
Ketones, POC UA: NEGATIVE mg/dL
Leukocytes, UA: NEGATIVE
Nitrite, UA: NEGATIVE
Protein Ur, POC: NEGATIVE mg/dL
Spec Grav, UA: 1.02 (ref 1.010–1.025)
Urobilinogen, UA: 0.2 E.U./dL
pH, UA: 7 (ref 5.0–8.0)

## 2020-02-24 MED ORDER — CETIRIZINE HCL 10 MG PO TABS: 10.0000 mg | ORAL_TABLET | Freq: Every day | ORAL | 0 refills | Status: DC

## 2020-02-24 MED ORDER — POLYETHYLENE GLYCOL 3350 17 GM/SCOOP PO POWD: 1.0000 | Freq: Once | ORAL | 0 refills | Status: AC

## 2020-02-24 MED ORDER — FLUTICASONE PROPIONATE 50 MCG/ACT NA SUSP: 2.0000 | Freq: Every day | NASAL | 0 refills | Status: DC
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
## 2020-02-24 NOTE — Discharge Instructions (Addendum)
Zyrtec, flonase once a day. Miralax once a day. Continue flomax once a day. Follow up with PCP about sleeping and throat. Follow up with urology about prostate. Go to ER if you develop fever, difficulty breathing, chest pain, blood in urine, or you cannot urinate or have a bowel movement.

## 2020-02-24 NOTE — ED Triage Notes (Signed)
Patient in with c/o urinary frequency, itching and burning while urination. States he "urinates only small amounts very frequently".  Has been taking flomax   Also c/o feeling like he has to have BM but cannot. Says he has been going every day but only small amounts with intermittent abdominal pain  Denies fever, penile discharge   Also c/o ST, would like throat evaluated

## 2020-03-08 ENCOUNTER — Other Ambulatory Visit: Payer: Self-pay | Admitting: Physician Assistant

## 2020-03-08 ENCOUNTER — Other Ambulatory Visit: Payer: Self-pay | Admitting: Internal Medicine

## 2020-03-08 DIAGNOSIS — E1169 Type 2 diabetes mellitus with other specified complication: Secondary | ICD-10-CM

## 2020-03-08 DIAGNOSIS — E669 Obesity, unspecified: Secondary | ICD-10-CM

## 2020-03-08 MED FILL — METFORMIN HCL 500 MG TABS: 500 | 30 days supply | Qty: 60 | Fill #0

## 2020-03-08 MED FILL — TAMSULOSIN HCL 0.4 MG CAP: 0.4 | 90 days supply | Qty: 180 | Fill #0

## 2020-03-08 MED FILL — FINASTERIDE 5 MG TABLET: 5 | 30 days supply | Qty: 30 | Fill #3

## 2020-03-08 MED FILL — LISINOPRIL-HCTZ 10-12.5 MG: 10-12.5 | 30 days supply | Qty: 30 | Fill #0

## 2020-04-12 MED FILL — METFORMIN HCL 500 MG TABS: 500 | 30 days supply | Qty: 60 | Fill #1

## 2020-04-12 MED FILL — LISINOPRIL-HCTZ 10-12.5 MG: 10-12.5 | 30 days supply | Qty: 30 | Fill #1

## 2020-04-12 MED FILL — FINASTERIDE 5 MG TABLET: 5 | 30 days supply | Qty: 30 | Fill #4

## 2020-05-17 MED FILL — ATORVASTATIN CALCIUM 40 MG: 40 | 90 days supply | Qty: 90 | Fill #1

## 2020-05-17 MED FILL — FINASTERIDE 5 MG TABLET: 5 | 30 days supply | Qty: 30 | Fill #5

## 2020-05-17 MED FILL — METFORMIN HCL 500 MG TABS: 500 | 30 days supply | Qty: 60 | Fill #2

## 2020-05-17 MED FILL — LISINOPRIL-HCTZ 10-12.5 MG: 10-12.5 | 30 days supply | Qty: 30 | Fill #2

## 2020-05-17 MED FILL — ADVAIR 250/50 DISKUS: 250-50 | 30 days supply | Qty: 60 | Fill #1

## 2020-06-12 ENCOUNTER — Other Ambulatory Visit: Payer: Self-pay | Admitting: Internal Medicine

## 2020-06-12 DIAGNOSIS — E1169 Type 2 diabetes mellitus with other specified complication: Secondary | ICD-10-CM

## 2020-06-12 DIAGNOSIS — E669 Obesity, unspecified: Secondary | ICD-10-CM

## 2020-06-12 MED FILL — LISINOPRIL-HCTZ 10-12.5 MG: 10-12.5 | 30 days supply | Qty: 30 | Fill #3

## 2020-06-12 MED FILL — TAMSULOSIN HCL 0.4 MG CAP: 0.4 | 90 days supply | Qty: 180 | Fill #1

## 2020-06-12 MED FILL — METFORMIN HCL 500 MG TABS: 500 | 30 days supply | Qty: 60 | Fill #0

## 2020-06-12 MED FILL — FINASTERIDE 5 MG TABLET: 5 | 90 days supply | Qty: 90 | Fill #0

## 2020-06-22 ENCOUNTER — Other Ambulatory Visit: Payer: Self-pay

## 2020-06-22 ENCOUNTER — Ambulatory Visit: Payer: Medicare Other | Attending: Internal Medicine | Admitting: Internal Medicine

## 2020-08-04 ENCOUNTER — Other Ambulatory Visit: Payer: Self-pay

## 2020-08-09 ENCOUNTER — Other Ambulatory Visit: Payer: Self-pay

## 2020-08-09 ENCOUNTER — Ambulatory Visit: Payer: Medicare Other | Admitting: Physician Assistant

## 2020-08-09 VITALS — BP 135/78 | HR 68 | Temp 98.2°F | Resp 18 | Ht 65.0 in | Wt 213.0 lb

## 2020-08-09 DIAGNOSIS — I10 Essential (primary) hypertension: Secondary | ICD-10-CM | POA: Diagnosis not present

## 2020-08-09 DIAGNOSIS — E1169 Type 2 diabetes mellitus with other specified complication: Secondary | ICD-10-CM

## 2020-08-09 DIAGNOSIS — E785 Hyperlipidemia, unspecified: Secondary | ICD-10-CM | POA: Diagnosis not present

## 2020-08-09 DIAGNOSIS — J9801 Acute bronchospasm: Secondary | ICD-10-CM | POA: Diagnosis not present

## 2020-08-09 DIAGNOSIS — J302 Other seasonal allergic rhinitis: Secondary | ICD-10-CM | POA: Diagnosis not present

## 2020-08-09 DIAGNOSIS — E669 Obesity, unspecified: Secondary | ICD-10-CM | POA: Diagnosis not present

## 2020-08-09 DIAGNOSIS — K5904 Chronic idiopathic constipation: Secondary | ICD-10-CM | POA: Diagnosis not present

## 2020-08-09 DIAGNOSIS — N400 Enlarged prostate without lower urinary tract symptoms: Secondary | ICD-10-CM | POA: Diagnosis not present

## 2020-08-09 LAB — POCT GLYCOSYLATED HEMOGLOBIN (HGB A1C): Hemoglobin A1C: 6.5 % — AB (ref 4.0–5.6)

## 2020-08-09 LAB — GLUCOSE, POCT (MANUAL RESULT ENTRY): POC Glucose: 115 mg/dl — AB (ref 70–99)

## 2020-08-09 MED ORDER — FLUTICASONE-SALMETEROL 250-50 MCG/DOSE IN AEPB
1.0000 | INHALATION_SPRAY | Freq: Two times a day (BID) | RESPIRATORY_TRACT | 2 refills | Status: DC
  Filled 2020-08-09: qty 60, 30d supply, fill #0

## 2020-08-09 MED ORDER — FINASTERIDE 5 MG PO TABS
ORAL_TABLET | Freq: Every day | ORAL | 0 refills | Status: DC
  Filled 2020-08-09 – 2020-09-05 (×2): qty 90, 90d supply, fill #0

## 2020-08-09 MED ORDER — FLUTICASONE PROPIONATE 50 MCG/ACT NA SUSP
2.0000 | Freq: Every day | NASAL | 2 refills | Status: DC
  Filled 2020-08-09: qty 16, 30d supply, fill #0
  Filled 2020-09-05: qty 16, 30d supply, fill #1
  Filled 2020-10-11 – 2020-11-09 (×2): qty 16, 30d supply, fill #2

## 2020-08-09 MED ORDER — LISINOPRIL-HYDROCHLOROTHIAZIDE 10-12.5 MG PO TABS
1.0000 | ORAL_TABLET | Freq: Every day | ORAL | 0 refills | Status: DC
  Filled 2020-08-09: qty 90, 90d supply, fill #0

## 2020-08-09 MED ORDER — TAMSULOSIN HCL 0.4 MG PO CAPS
ORAL_CAPSULE | ORAL | 0 refills | Status: DC
  Filled 2020-08-09 – 2020-09-05 (×2): qty 180, 90d supply, fill #0

## 2020-08-09 MED ORDER — NAPROXEN 500 MG PO TABS
500.0000 mg | ORAL_TABLET | Freq: Two times a day (BID) | ORAL | 0 refills | Status: DC
  Filled 2020-08-09: qty 30, 15d supply, fill #0

## 2020-08-09 MED ORDER — METFORMIN HCL 500 MG PO TABS
ORAL_TABLET | Freq: Two times a day (BID) | ORAL | 2 refills | Status: DC
  Filled 2020-08-09: qty 60, 30d supply, fill #0
  Filled 2020-09-05: qty 60, 30d supply, fill #1
  Filled 2020-10-11: qty 60, 30d supply, fill #2

## 2020-08-09 MED ORDER — ATORVASTATIN CALCIUM 40 MG PO TABS
ORAL_TABLET | Freq: Every day | ORAL | 3 refills | Status: DC
  Filled 2020-08-09: qty 90, 90d supply, fill #0
  Filled 2020-11-09: qty 90, 90d supply, fill #1
  Filled 2021-02-15 – 2021-03-07 (×2): qty 90, 90d supply, fill #2

## 2020-08-09 MED ORDER — ALBUTEROL SULFATE HFA 108 (90 BASE) MCG/ACT IN AERS
2.0000 | INHALATION_SPRAY | Freq: Four times a day (QID) | RESPIRATORY_TRACT | 0 refills | Status: DC | PRN
  Filled 2020-08-09: qty 8.5, 25d supply, fill #0

## 2020-08-09 MED ORDER — CETIRIZINE HCL 10 MG PO TABS
10.0000 mg | ORAL_TABLET | Freq: Every day | ORAL | 0 refills | Status: DC
  Filled 2020-08-09: qty 30, 30d supply, fill #0

## 2020-08-09 MED ORDER — DOCUSATE SODIUM 100 MG PO CAPS
100.0000 mg | ORAL_CAPSULE | Freq: Every day | ORAL | 5 refills | Status: AC | PRN
  Filled 2020-08-09: qty 30, 30d supply, fill #0

## 2020-08-09 NOTE — Patient Instructions (Signed)
[facility]  I encourage you to continue your follow-up with urology.  Please let us know if there is anything else we can do for you  Roney Jaffe, PA-C Physician Assistant [facility] Medicine [email or web address]    Mantenimiento de la salud despus de los 65 aos de edad Health Maintenance After Age 75 Despus de los 65 aos de edad, corre un riesgo mayor de Film/video editor enfermedades e infecciones a largo plazo, como tambin de sufrir lesiones por cadas. Las cadas son la causa principal de las fracturas de huesos y lesiones en la cabeza de personas mayores de 65 aos de edad. Recibir cuidados preventivos de forma regular puede ayudarlo a mantenerse saludable y en buen [place]. Los cuidados preventivos incluyen realizarse anlisis de forma regular y Forensic psychologist en el estilo de vida segn las recomendaciones del mdico. Converse con el profesional que lo asiste sobre:  Las pruebas de deteccin y los anlisis que debe International aid/development worker. Una prueba de deteccin es un estudio que se para Engineer, manufacturing la presencia de una enfermedad cuando no tiene sntomas.  Un plan de dieta y ejercicios adecuado para usted. Qu debo saber sobre las pruebas de deteccin y los anlisis para prevenir cadas? Realizarse pruebas de deteccin y [employer] es la mejor manera de Engineer, manufacturing un problema de salud de forma temprana. El diagnstico y tratamiento tempranos le brindan la mejor oportunidad de Chief Operating Officer las afecciones mdicas que son comunes despus de los 65 aos de edad. Ciertas afecciones y elecciones de estilo de vida pueden hacer que sea ms propenso a sufrir Engineer, site. El mdico puede recomendarle lo siguiente:  Controles regulares de la visin. Una visin deficiente y afecciones como las cataratas pueden hacer que sea ms propenso a sufrir Engineer, site. Si [place] lentes, asegrese de obtener una receta  actualizada si su visin cambia.  Revisin de medicamentos. Revise regularmente con el mdico todos los medicamentos que toma, incluidos los medicamentos de [place]. Consulte al [employer] efectos secundarios que pueden hacer que sea ms propenso a sufrir Engineer, site. Informe al mdico si alguno de los medicamentos que toma lo hace sentir mareado o somnoliento.  Pruebas de deteccin para la osteoporosis. La osteoporosis es una afeccin que hace que los huesos se vuelvan ms frgiles. En consecuencia, los huesos pueden debilitarse y quebrarse ms fcilmente.  Pruebas de deteccin para la presin arterial. Los cambios en la presin arterial y los medicamentos para Chief Operating Officer la presin arterial pueden hacerlo sentir mareado.  Controles de fuerza y equilibrio. El mdico puede recomendar ciertos estudios para controlar su fuerza y equilibrio al estar de pie, al caminar o al cambiar de posicin.  Examen de los pies. El dolor y Materials engineer en los pies, como tambin no utilizar el calzado [place], pueden hacer que sea ms propenso a sufrir Engineer, site.  Prueba de deteccin de la depresin. Es ms probable que sufra una cada si tiene miedo a caerse, se siente mal emocionalmente o se siente incapaz de Probation officer.  Prueba de deteccin de consumo de alcohol. Beber demasiado alcohol puede afectar su equilibrio y puede hacer que sea ms propenso a sufrir Engineer, site. Qu medidas puedo tomar para reducir mi riesgo de sufrir una cada? Instrucciones generales  Hable con el mdico sobre sus riesgos de sufrir una cada. Infrmele a su mdico si: ? Se cae. Asegrese de informarle a su mdico acerca de todas las cadas, incluso  aquellas que parecen ser [employer]. ? Se siente mareado, somnoliento o que pierde el equilibrio.  Tome los medicamentos de venta libre y los recetados solamente como se lo haya indicado el mdico. Estos incluyen todos los suplementos.  Siga una dieta  sana y [place] un peso saludable. Una dieta saludable incluye productos lcteos descremados, carnes bajas en contenido de grasa ([place], [place] de granos enteros, frijoles y [place] frutas y verduras. La seguridad en el hogar  Retire los objetos que puedan causar tropiezos tales como alfombras, cables u obstculos.  Instale equipos de seguridad, como barras para sostn en los baos y barandas de seguridad en las escaleras.  Mantenga las habitaciones y los pasillos bien iluminados. Actividad  Siga un programa de ejercicio regular para mantenerse en forma. Esto lo ayudar a Radio producer equilibrio. Consulte al mdico qu tipos de ejercicios son adecuados para usted.  Si necesita un bastn o un andador, selo segn las recomendaciones del mdico.  Utilice calzado con buen apoyo y suela antideslizante.   Estilo de vida  No beba alcohol si el mdico le indica que no beba.  Si bebe alcohol, limite la cantidad que consume: ? De 0 a 1 medida por da para las mujeres. ? De 0 a 2 medidas por da para los hombres.  Est atento a la cantidad de alcohol que contiene su bebida. En los [place] una medida equivale a una botella tpica de cerveza (12 onzas), media copa de vino (5 onzas) o una medida de bebida blanca (1 onza).  No consuma ningn producto que contenga nicotina o tabaco, como cigarrillos y Administrator, Civil Service. Si necesita ayuda para dejar de fumar, consulte al mdico. Resumen  Tener un estilo de vida saludable y recibir cuidados preventivos pueden ayudar a Research scientist (physical sciences) salud y el bienestar despus de los 65 aos de [place].  Realizarse pruebas de deteccin y [employer] es la mejor manera de Engineer, manufacturing un problema de salud de forma temprana y [place] a Automotive engineer una cada. El diagnstico y tratamiento tempranos le brindan la mejor oportunidad de Chief Operating Officer las afecciones mdicas ms comunes en las personas mayores de 65 aos de edad.  Las cadas son la causa principal de las fracturas de huesos  y lesiones en la cabeza de personas mayores de 65 aos de edad. Tome precauciones para evitar una cada en su casa.  Trabaje con el mdico para saber qu cambios que puede hacer para mejorar su salud y [place], y para prevenir las cadas. Esta informacin no tiene Theme park manager el consejo del mdico. Asegrese de hacerle al mdico cualquier pregunta que tenga. Document Revised: 06/04/2017 Document Reviewed: 06/04/2017 Elsevier Patient Education  2021 [employer].
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
## 2020-08-09 NOTE — Progress Notes (Signed)
Patient has taken medication today. Patient has not eaten today. Patient request refill on allergy, inhaler medication. Patient does not use the compression stockings.

## 2020-08-12 DIAGNOSIS — J9801 Acute bronchospasm: Secondary | ICD-10-CM | POA: Insufficient documentation

## 2020-08-12 DIAGNOSIS — J302 Other seasonal allergic rhinitis: Secondary | ICD-10-CM | POA: Insufficient documentation

## 2020-08-30 ENCOUNTER — Other Ambulatory Visit: Payer: Self-pay

## 2020-08-30 DIAGNOSIS — R972 Elevated prostate specific antigen [PSA]: Secondary | ICD-10-CM | POA: Diagnosis not present

## 2020-08-30 DIAGNOSIS — R351 Nocturia: Secondary | ICD-10-CM | POA: Diagnosis not present

## 2020-08-30 DIAGNOSIS — N401 Enlarged prostate with lower urinary tract symptoms: Secondary | ICD-10-CM | POA: Diagnosis not present

## 2020-08-30 DIAGNOSIS — R3912 Poor urinary stream: Secondary | ICD-10-CM | POA: Diagnosis not present

## 2020-09-04 ENCOUNTER — Other Ambulatory Visit: Payer: Self-pay

## 2020-09-05 ENCOUNTER — Other Ambulatory Visit: Payer: Self-pay

## 2020-09-05 ENCOUNTER — Other Ambulatory Visit: Payer: Self-pay | Admitting: Physician Assistant

## 2020-09-05 DIAGNOSIS — J9801 Acute bronchospasm: Secondary | ICD-10-CM

## 2020-09-06 ENCOUNTER — Other Ambulatory Visit: Payer: Self-pay

## 2020-09-07 ENCOUNTER — Other Ambulatory Visit: Payer: Self-pay

## 2020-09-13 ENCOUNTER — Other Ambulatory Visit: Payer: Self-pay

## 2020-09-13 MED ORDER — ALBUTEROL SULFATE HFA 108 (90 BASE) MCG/ACT IN AERS
2.0000 | INHALATION_SPRAY | Freq: Four times a day (QID) | RESPIRATORY_TRACT | 0 refills | Status: DC | PRN
  Filled 2020-09-13 – 2020-10-11 (×2): qty 8.5, 25d supply, fill #0

## 2020-09-20 ENCOUNTER — Other Ambulatory Visit: Payer: Self-pay

## 2020-10-11 ENCOUNTER — Other Ambulatory Visit: Payer: Self-pay

## 2020-10-18 ENCOUNTER — Other Ambulatory Visit: Payer: Self-pay

## 2020-11-08 ENCOUNTER — Ambulatory Visit: Payer: Medicare Other | Admitting: Internal Medicine

## 2020-11-09 ENCOUNTER — Other Ambulatory Visit: Payer: Self-pay

## 2020-11-09 ENCOUNTER — Other Ambulatory Visit: Payer: Self-pay | Admitting: Physician Assistant

## 2020-11-09 DIAGNOSIS — I10 Essential (primary) hypertension: Secondary | ICD-10-CM

## 2020-11-09 DIAGNOSIS — E1169 Type 2 diabetes mellitus with other specified complication: Secondary | ICD-10-CM

## 2020-11-09 MED ORDER — METFORMIN HCL 500 MG PO TABS
ORAL_TABLET | Freq: Two times a day (BID) | ORAL | 2 refills | Status: DC
  Filled 2020-11-09: qty 60, 30d supply, fill #0
  Filled 2020-12-21: qty 60, 30d supply, fill #1
  Filled 2021-01-23: qty 60, 30d supply, fill #2

## 2020-11-09 MED ORDER — LISINOPRIL-HYDROCHLOROTHIAZIDE 10-12.5 MG PO TABS
1.0000 | ORAL_TABLET | Freq: Every day | ORAL | 0 refills | Status: DC
  Filled 2020-11-09: qty 90, 90d supply, fill #0

## 2020-11-12 ENCOUNTER — Other Ambulatory Visit: Payer: Self-pay

## 2020-11-13 ENCOUNTER — Other Ambulatory Visit: Payer: Self-pay | Admitting: Physician Assistant

## 2020-11-13 ENCOUNTER — Other Ambulatory Visit: Payer: Self-pay

## 2020-11-13 DIAGNOSIS — N400 Enlarged prostate without lower urinary tract symptoms: Secondary | ICD-10-CM

## 2020-11-13 MED ORDER — TAMSULOSIN HCL 0.4 MG PO CAPS
ORAL_CAPSULE | ORAL | 0 refills | Status: DC
  Filled 2020-11-13: qty 180, 90d supply, fill #0

## 2020-11-13 MED ORDER — FINASTERIDE 5 MG PO TABS
ORAL_TABLET | Freq: Every day | ORAL | 0 refills | Status: DC
  Filled 2020-11-13: qty 90, 90d supply, fill #0

## 2020-11-14 ENCOUNTER — Other Ambulatory Visit: Payer: Self-pay

## 2020-11-15 ENCOUNTER — Other Ambulatory Visit: Payer: Self-pay

## 2020-12-21 ENCOUNTER — Other Ambulatory Visit: Payer: Self-pay

## 2021-01-23 ENCOUNTER — Other Ambulatory Visit: Payer: Self-pay

## 2021-01-23 ENCOUNTER — Other Ambulatory Visit: Payer: Self-pay | Admitting: Physician Assistant

## 2021-01-23 DIAGNOSIS — J302 Other seasonal allergic rhinitis: Secondary | ICD-10-CM

## 2021-01-23 DIAGNOSIS — J9801 Acute bronchospasm: Secondary | ICD-10-CM

## 2021-01-23 MED ORDER — FLUTICASONE PROPIONATE 50 MCG/ACT NA SUSP
2.0000 | Freq: Every day | NASAL | 0 refills | Status: DC
Start: 2021-01-23 — End: 2022-02-24
  Filled 2021-01-23: qty 16, 30d supply, fill #0

## 2021-01-23 MED ORDER — ALBUTEROL SULFATE HFA 108 (90 BASE) MCG/ACT IN AERS
2.0000 | INHALATION_SPRAY | Freq: Four times a day (QID) | RESPIRATORY_TRACT | 0 refills | Status: DC | PRN
  Filled 2021-01-23: qty 8.5, 25d supply, fill #0

## 2021-01-30 ENCOUNTER — Other Ambulatory Visit: Payer: Self-pay

## 2021-02-15 ENCOUNTER — Other Ambulatory Visit: Payer: Self-pay

## 2021-02-15 ENCOUNTER — Other Ambulatory Visit: Payer: Self-pay | Admitting: Physician Assistant

## 2021-02-15 DIAGNOSIS — N400 Enlarged prostate without lower urinary tract symptoms: Secondary | ICD-10-CM

## 2021-02-15 DIAGNOSIS — I10 Essential (primary) hypertension: Secondary | ICD-10-CM

## 2021-02-18 ENCOUNTER — Other Ambulatory Visit: Payer: Self-pay

## 2021-02-18 MED ORDER — FINASTERIDE 5 MG PO TABS
ORAL_TABLET | Freq: Every day | ORAL | 0 refills | Status: DC
  Filled 2021-02-18 – 2021-03-07 (×2): qty 90, 90d supply, fill #0

## 2021-02-18 MED ORDER — LISINOPRIL-HYDROCHLOROTHIAZIDE 10-12.5 MG PO TABS
1.0000 | ORAL_TABLET | Freq: Every day | ORAL | 0 refills | Status: DC
Start: 2021-02-18 — End: 2021-04-08
  Filled 2021-02-18 – 2021-03-07 (×2): qty 90, 90d supply, fill #0

## 2021-02-18 MED ORDER — TAMSULOSIN HCL 0.4 MG PO CAPS
ORAL_CAPSULE | ORAL | 0 refills | Status: DC
Start: 2021-02-18 — End: 2021-04-08
  Filled 2021-02-18 – 2021-03-07 (×2): qty 180, 90d supply, fill #0

## 2021-02-22 ENCOUNTER — Other Ambulatory Visit: Payer: Self-pay

## 2021-02-25 ENCOUNTER — Other Ambulatory Visit: Payer: Self-pay

## 2021-03-07 ENCOUNTER — Other Ambulatory Visit: Payer: Self-pay

## 2021-03-07 ENCOUNTER — Other Ambulatory Visit: Payer: Self-pay | Admitting: Physician Assistant

## 2021-03-07 DIAGNOSIS — E1169 Type 2 diabetes mellitus with other specified complication: Secondary | ICD-10-CM

## 2021-03-11 ENCOUNTER — Other Ambulatory Visit: Payer: Self-pay

## 2021-03-18 ENCOUNTER — Other Ambulatory Visit: Payer: Self-pay

## 2021-03-18 ENCOUNTER — Other Ambulatory Visit: Payer: Self-pay | Admitting: Pharmacist

## 2021-03-18 DIAGNOSIS — E1169 Type 2 diabetes mellitus with other specified complication: Secondary | ICD-10-CM

## 2021-03-18 MED ORDER — METFORMIN HCL 500 MG PO TABS
ORAL_TABLET | Freq: Two times a day (BID) | ORAL | 2 refills | Status: DC
  Filled 2021-03-18: qty 60, 30d supply, fill #0

## 2021-03-20 ENCOUNTER — Other Ambulatory Visit: Payer: Self-pay

## 2021-03-21 ENCOUNTER — Ambulatory Visit: Payer: Medicare Other | Admitting: Internal Medicine

## 2021-04-08 ENCOUNTER — Encounter: Payer: Self-pay | Admitting: Internal Medicine

## 2021-04-08 ENCOUNTER — Other Ambulatory Visit: Payer: Self-pay

## 2021-04-08 ENCOUNTER — Ambulatory Visit: Payer: Medicare Other | Attending: Internal Medicine | Admitting: Internal Medicine

## 2021-04-08 VITALS — BP 130/80 | HR 79 | Resp 16 | Wt 219.8 lb

## 2021-04-08 DIAGNOSIS — E785 Hyperlipidemia, unspecified: Secondary | ICD-10-CM | POA: Diagnosis not present

## 2021-04-08 DIAGNOSIS — I10 Essential (primary) hypertension: Secondary | ICD-10-CM | POA: Diagnosis not present

## 2021-04-08 DIAGNOSIS — Z23 Encounter for immunization: Secondary | ICD-10-CM | POA: Diagnosis not present

## 2021-04-08 DIAGNOSIS — M5432 Sciatica, left side: Secondary | ICD-10-CM

## 2021-04-08 DIAGNOSIS — E669 Obesity, unspecified: Secondary | ICD-10-CM

## 2021-04-08 DIAGNOSIS — N400 Enlarged prostate without lower urinary tract symptoms: Secondary | ICD-10-CM | POA: Diagnosis not present

## 2021-04-08 DIAGNOSIS — E1169 Type 2 diabetes mellitus with other specified complication: Secondary | ICD-10-CM | POA: Diagnosis not present

## 2021-04-08 DIAGNOSIS — Z1211 Encounter for screening for malignant neoplasm of colon: Secondary | ICD-10-CM

## 2021-04-08 LAB — POCT GLYCOSYLATED HEMOGLOBIN (HGB A1C): HbA1c, POC (controlled diabetic range): 9 % — AB (ref 0.0–7.0)

## 2021-04-08 LAB — GLUCOSE, POCT (MANUAL RESULT ENTRY): POC Glucose: 226 mg/dl — AB (ref 70–99)

## 2021-04-08 MED ORDER — TAMSULOSIN HCL 0.4 MG PO CAPS
0.8000 mg | ORAL_CAPSULE | Freq: Every day | ORAL | 1 refills | Status: DC
  Filled 2021-04-08 – 2021-08-16 (×3): qty 180, 90d supply, fill #0
  Filled 2021-11-08: qty 180, 90d supply, fill #1

## 2021-04-08 MED ORDER — FINASTERIDE 5 MG PO TABS
ORAL_TABLET | Freq: Every day | ORAL | 1 refills | Status: DC
  Filled 2021-04-08: qty 90, fill #0
  Filled 2021-05-31: qty 90, 90d supply, fill #0
  Filled 2021-08-16: qty 90, 90d supply, fill #1

## 2021-04-08 MED ORDER — METFORMIN HCL 1000 MG PO TABS
1000.0000 mg | ORAL_TABLET | Freq: Two times a day (BID) | ORAL | 3 refills | Status: DC
  Filled 2021-04-08 – 2021-07-12 (×4): qty 180, 90d supply, fill #0
  Filled 2021-10-11: qty 180, 90d supply, fill #1
  Filled 2022-02-03: qty 180, 90d supply, fill #2

## 2021-04-08 MED ORDER — LISINOPRIL-HYDROCHLOROTHIAZIDE 10-12.5 MG PO TABS
1.0000 | ORAL_TABLET | Freq: Every day | ORAL | 1 refills | Status: DC
  Filled 2021-04-08 – 2021-05-31 (×2): qty 90, 90d supply, fill #0
  Filled 2021-08-16: qty 90, 90d supply, fill #1

## 2021-04-08 MED ORDER — ATORVASTATIN CALCIUM 40 MG PO TABS
ORAL_TABLET | Freq: Every day | ORAL | 3 refills | Status: DC
  Filled 2021-04-08: qty 90, fill #0
  Filled 2021-05-31: qty 90, 90d supply, fill #0
  Filled 2021-08-16: qty 90, 90d supply, fill #1
  Filled 2021-11-28: qty 90, 90d supply, fill #2
  Filled 2022-03-05: qty 90, 90d supply, fill #3

## 2021-04-08 MED ORDER — NAPROXEN 250 MG PO TABS
ORAL_TABLET | ORAL | 0 refills | Status: AC
  Filled 2021-04-08: qty 40, 20d supply, fill #0
  Filled 2021-04-15 – 2021-04-24 (×2): qty 40, 10d supply, fill #0
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
## 2021-04-09 LAB — CBC
Hematocrit: 48.6 % (ref 37.5–51.0)
Hemoglobin: 16.2 g/dL (ref 13.0–17.7)
MCH: 29.9 pg (ref 26.6–33.0)
MCHC: 33.3 g/dL (ref 31.5–35.7)
MCV: 90 fL (ref 79–97)
Platelets: 255 10*3/uL (ref 150–450)
RBC: 5.41 x10E6/uL (ref 4.14–5.80)
RDW: 13.1 % (ref 11.6–15.4)
WBC: 10.4 10*3/uL (ref 3.4–10.8)

## 2021-04-09 LAB — COMPREHENSIVE METABOLIC PANEL
ALT: 39 IU/L (ref 0–44)
AST: 29 IU/L (ref 0–40)
Albumin/Globulin Ratio: 1.6 (ref 1.2–2.2)
Albumin: 4.7 g/dL (ref 3.7–4.7)
Alkaline Phosphatase: 169 IU/L — ABNORMAL HIGH (ref 44–121)
BUN/Creatinine Ratio: 15 (ref 10–24)
BUN: 14 mg/dL (ref 8–27)
Bilirubin Total: 0.5 mg/dL (ref 0.0–1.2)
CO2: 27 mmol/L (ref 20–29)
Calcium: 10.2 mg/dL (ref 8.6–10.2)
Chloride: 96 mmol/L (ref 96–106)
Creatinine, Ser: 0.91 mg/dL (ref 0.76–1.27)
Globulin, Total: 2.9 g/dL (ref 1.5–4.5)
Glucose: 203 mg/dL — ABNORMAL HIGH (ref 70–99)
Potassium: 4.6 mmol/L (ref 3.5–5.2)
Sodium: 139 mmol/L (ref 134–144)
Total Protein: 7.6 g/dL (ref 6.0–8.5)
eGFR: 88 mL/min/{1.73_m2} (ref >59)

## 2021-04-09 LAB — LIPID PANEL
Chol/HDL Ratio: 2.7 ratio (ref 0.0–5.0)
Cholesterol, Total: 157 mg/dL (ref 100–199)
HDL: 58 mg/dL (ref >39)
LDL Chol Calc (NIH): 79 mg/dL (ref 0–99)
Triglycerides: 110 mg/dL (ref 0–149)
VLDL Cholesterol Cal: 20 mg/dL (ref 5–40)

## 2021-04-09 LAB — MICROALBUMIN / CREATININE URINE RATIO
Creatinine, Urine: 202.8 mg/dL
Microalb/Creat Ratio: 27 mg/g creat (ref 0–29)
Microalbumin, Urine: 53.8 ug/mL

## 2021-04-10 ENCOUNTER — Telehealth: Payer: Self-pay

## 2021-04-10 NOTE — Telephone Encounter (Signed)
Contacted pt to go over lab results spoke with pt daughter and made aware if results. Pt daughter doesn't have any questions or concerns

## 2021-04-15 ENCOUNTER — Other Ambulatory Visit: Payer: Self-pay

## 2021-04-16 ENCOUNTER — Other Ambulatory Visit: Payer: Self-pay

## 2021-04-23 ENCOUNTER — Other Ambulatory Visit: Payer: Self-pay

## 2021-04-24 ENCOUNTER — Other Ambulatory Visit: Payer: Self-pay

## 2021-05-31 ENCOUNTER — Other Ambulatory Visit: Payer: Self-pay

## 2021-07-04 ENCOUNTER — Other Ambulatory Visit: Payer: Self-pay

## 2021-07-12 ENCOUNTER — Other Ambulatory Visit: Payer: Self-pay

## 2021-08-16 ENCOUNTER — Other Ambulatory Visit: Payer: Self-pay

## 2021-08-29 ENCOUNTER — Ambulatory Visit: Payer: Medicare Other | Attending: Physician Assistant | Admitting: Physician Assistant

## 2021-08-29 ENCOUNTER — Other Ambulatory Visit: Payer: Self-pay

## 2021-08-29 ENCOUNTER — Encounter: Payer: Self-pay | Admitting: Physician Assistant

## 2021-08-29 VITALS — BP 144/72 | HR 57 | Ht 65.0 in | Wt 214.8 lb

## 2021-08-29 DIAGNOSIS — Z7984 Long term (current) use of oral hypoglycemic drugs: Secondary | ICD-10-CM | POA: Diagnosis not present

## 2021-08-29 DIAGNOSIS — E669 Obesity, unspecified: Secondary | ICD-10-CM

## 2021-08-29 DIAGNOSIS — E1169 Type 2 diabetes mellitus with other specified complication: Secondary | ICD-10-CM

## 2021-08-29 DIAGNOSIS — N400 Enlarged prostate without lower urinary tract symptoms: Secondary | ICD-10-CM

## 2021-08-29 DIAGNOSIS — I10 Essential (primary) hypertension: Secondary | ICD-10-CM

## 2021-08-29 DIAGNOSIS — K5909 Other constipation: Secondary | ICD-10-CM

## 2021-08-29 DIAGNOSIS — E785 Hyperlipidemia, unspecified: Secondary | ICD-10-CM

## 2021-08-29 LAB — POCT GLYCOSYLATED HEMOGLOBIN (HGB A1C): Hemoglobin A1C: 6.8 % — AB (ref 4.0–5.6)

## 2021-08-29 LAB — GLUCOSE, POCT (MANUAL RESULT ENTRY): POC Glucose: 109 mg/dl — AB (ref 70–99)

## 2021-08-29 MED ORDER — LISINOPRIL-HYDROCHLOROTHIAZIDE 10-12.5 MG PO TABS
1.0000 | ORAL_TABLET | Freq: Every day | ORAL | 1 refills | Status: DC
  Filled 2021-08-29 – 2021-11-28 (×2): qty 90, 90d supply, fill #0
  Filled 2022-04-02: qty 90, 90d supply, fill #1

## 2021-08-29 MED ORDER — POLYETHYLENE GLYCOL 3350 17 GM/SCOOP PO POWD
17.0000 g | Freq: Two times a day (BID) | ORAL | 1 refills | Status: AC | PRN
  Filled 2021-08-29: qty 510, 15d supply, fill #0

## 2021-08-29 MED ORDER — FINASTERIDE 5 MG PO TABS
ORAL_TABLET | Freq: Every day | ORAL | 1 refills | Status: DC
Start: 2021-08-29 — End: 2022-02-24
  Filled 2021-08-29: qty 90, fill #0
  Filled 2021-11-08: qty 90, 90d supply, fill #0

## 2021-08-29 NOTE — Progress Notes (Signed)
Patient ID: Louis Peterson, male   DOB: 07-Apr-1946, 76 y.o.   MRN: [id number] ? ? ?[facility] Concha Se, is a 76 y.o. male ? ?BWI:[id number] ? ?ULA:[id number] ? ?DOB - 26-Jul-1945 ? ?Chief Complaint  ?Patient presents with  ? Follow-up  ?  diabetes  ?    ? ?Subjective:  ? ?Louis Peterson is a 76 y.o. male here today for diabetes check and med RF.  His daughter is here translating for him.  Reports medication compliance.  Denies hyper/hypoglycemic episodes.  Doing well overall.  No new issues or concerns today.  He is requesting miralax.  He has had long-standing constipation. ? ?No problems updated. ? ?ALLERGIES: ?No Known Allergies ? ?PAST MEDICAL HISTORY: ?Past Medical History:  ?Diagnosis Date  ? Acute osteomyelitis of fibula ([place])   ? Diabetes mellitus without complication ([place])   ? Enlarged prostate   ? Hardware complicating wound infection ([place])   ? Hypertension   ? MSSA (methicillin susceptible Staphylococcus aureus) infection   ? 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 ? ?MEDICATIONS AT HOME: ?Prior to Admission medications   ?Medication Sig Start Date End Date Taking? Authorizing Provider  ?Accu-Chek Softclix Lancets lancets Check blood sugar once daily. 02/20/20  Yes Ladell Pier, MD  ?albuterol (VENTOLIN HFA) 108 (90 Base) MCG/ACT inhaler Inhale 2 puffs into the lungs every 6 (six) hours as needed for wheezing or shortness of breath. 01/23/21  Yes Dorna Mai, MD  ?atorvastatin (LIPITOR) 40 MG tablet TAKE 1 TABLET (40 MG TOTAL) BY MOUTH DAILY. 04/08/21 04/08/22 Yes Ladell Pier, MD  ?Blood Glucose Monitoring Suppl (ACCU-CHEK GUIDE ME) w/Device KIT Check blood sugar once daily. 02/20/20  Yes Ladell Pier, MD  ?Elastic Bandages & Supports (MEDICAL COMPRESSION SOCKS) MISC 1 each by Does not apply route daily. 30 mm Hg pressure 05/13/16  Yes Funches, Josalyn, MD  ?fluticasone (FLONASE) 50 MCG/ACT nasal spray Place 2 sprays into both nostrils daily. 01/23/21  Yes Dorna Mai, MD   ?glucose blood (ACCU-CHEK GUIDE) test strip Check blood sugar once daily. 02/20/20  Yes Ladell Pier, MD  ?metFORMIN (GLUCOPHAGE) 1000 MG tablet Take 1 tablet (1,000 mg total) by mouth 2 (two) times daily with a meal. 04/08/21  Yes Ladell Pier, MD  ?naproxen (NAPROSYN) 250 MG tablet Take 1 to 2 tablets by mouth twice a day as needed for pain.  Take with food 04/08/21  Yes Ladell Pier, MD  ?polyethylene glycol powder (GLYCOLAX/MIRALAX) 17 GM/SCOOP powder Take 17 g by mouth 2 (two) times daily as needed. 08/29/21  Yes Argentina Donovan, PA-C  ?tamsulosin (FLOMAX) 0.4 MG CAPS capsule Take 2 capsules (0.8 mg total) by mouth daily after supper. 04/08/21  Yes Ladell Pier, MD  ?docusate sodium (COLACE) 100 MG capsule Take 1 capsule (100 mg total) by mouth daily as needed for mild constipation. ?Patient not taking: Reported on 08/29/2021 08/09/20   Mayers, Loraine Grip, PA-C  ?finasteride (PROSCAR) 5 MG tablet TAKE 1 TABLET (5 MG TOTAL) BY MOUTH DAILY. 08/29/21   Argentina Donovan, PA-C  ?lisinopril-hydrochlorothiazide (ZESTORETIC) 10-12.5 MG tablet Take 1 tablet by mouth daily. 08/29/21   Argentina Donovan, PA-C  ?Fluticasone-Salmeterol (ADVAIR) 250-50 MCG/DOSE AEPB INHALE 1 PUFF INTO THE LUNGS 2 (TWO) TIMES DAILY. 08/09/20 08/30/20  Mayers, Loraine Grip, PA-C  ? ? ?ROS: ?Neg HEENT ?Neg resp ?Neg cardiac ?+occasional constipation ?Neg GU ?Neg MS ?Neg psych ?Neg neuro ? ?Objective:  ? ?Vitals:  ? 08/29/21 0900  ?BP: Marland Kitchen)  144/72  ?Pulse: (!) 57  ?SpO2: 95%  ?Weight: 214 lb 12.8 oz (97.4 kg)  ?Height: 5' 5"  (1.651 m)  ? ?Exam ?General appearance : Awake, alert, not in any distress. Speech Clear. Not toxic looking ?HEENT: Atraumatic and Normocephalic ?Chest: Good air entry bilaterally, CTAB.  No rales/rhonchi/wheezing ?CVS: S1 S2 regular, no murmurs.  ?Extremities: B/L Lower Ext shows no edema, both legs are warm to touch ?Neurology: Awake alert, and oriented X 3, CN II-XII intact, Non focal ?Skin: No Rash ? ?Data  Review ?Lab Results  ?Component Value Date  ? HGBA1C 6.8 (A) 08/29/2021  ? HGBA1C 9.0 (A) 04/08/2021  ? HGBA1C 6.5 (A) 08/09/2020  ? ? ?Assessment & Plan  ? ?1. Diabetes mellitus type 2 in obese [facility]) ?Much improved!!!  Call for next RF (he currently has RF on some meds)  work on diabetic diet ?- Glucose (CBG) ?- HgB A1c ?- Comprehensive metabolic panel ?- CBC with Differential/Platelet ? ?2. Benign prostatic hyperplasia without lower urinary tract symptoms ?- Comprehensive metabolic panel ?- CBC with Differential/Platelet ?- finasteride (PROSCAR) 5 MG tablet; TAKE 1 TABLET (5 MG TOTAL) BY MOUTH DAILY.  Dispense: 90 tablet; Refill: 1 ? ?3. Essential hypertension ?- Comprehensive metabolic panel ?- CBC with Differential/Platelet ?- lisinopril-hydrochlorothiazide (ZESTORETIC) 10-12.5 MG tablet; Take 1 tablet by mouth daily.  Dispense: 90 tablet; Refill: 1 ? ?4. Other constipation ?- polyethylene glycol powder (GLYCOLAX/MIRALAX) 17 GM/SCOOP powder; Take 17 g by mouth 2 (two) times daily as needed.  Dispense: 510 g; Refill: 1 ? ?5. Hyperlipidemia associated with type 2 diabetes mellitus ([place]) ?- Lipid panel ?Has atorvastatin RF ? ? ? ?Return in about 3 months (around 11/28/2021) for PCP for chronic conditions. ? ?The patient was given clear instructions to go to ER or return to medical center if symptoms don't improve, worsen or new problems develop. The patient verbalized understanding. The patient was told to call to get lab results if they haven't heard anything in the next week.  ? ? ? ? ?Freeman Caldron, PA-C ?[place] ?[place], [place] ?[phone]   ?08/30/2021, 7:30 AM  ?

## 2021-08-30 ENCOUNTER — Other Ambulatory Visit: Payer: Self-pay

## 2021-08-30 DIAGNOSIS — R972 Elevated prostate specific antigen [PSA]: Secondary | ICD-10-CM | POA: Diagnosis not present

## 2021-08-30 DIAGNOSIS — N401 Enlarged prostate with lower urinary tract symptoms: Secondary | ICD-10-CM | POA: Diagnosis not present

## 2021-08-30 DIAGNOSIS — R3912 Poor urinary stream: Secondary | ICD-10-CM | POA: Diagnosis not present

## 2021-08-30 LAB — LIPID PANEL
Chol/HDL Ratio: 2.4 ratio (ref 0.0–5.0)
Cholesterol, Total: 146 mg/dL (ref 100–199)
HDL: 61 mg/dL (ref >39)
LDL Chol Calc (NIH): 66 mg/dL (ref 0–99)
Triglycerides: 102 mg/dL (ref 0–149)
VLDL Cholesterol Cal: 19 mg/dL (ref 5–40)

## 2021-08-30 LAB — CBC WITH DIFFERENTIAL/PLATELET
Basophils Absolute: 0.1 10*3/uL (ref 0.0–0.2)
Basos: 1 %
EOS (ABSOLUTE): 0.2 10*3/uL (ref 0.0–0.4)
Eos: 3 %
Hematocrit: 46.4 % (ref 37.5–51.0)
Hemoglobin: 15.7 g/dL (ref 13.0–17.7)
Immature Grans (Abs): 0 10*3/uL (ref 0.0–0.1)
Immature Granulocytes: 0 %
Lymphocytes Absolute: 2.4 10*3/uL (ref 0.7–3.1)
Lymphs: 27 %
MCH: 30.3 pg (ref 26.6–33.0)
MCHC: 33.8 g/dL (ref 31.5–35.7)
MCV: 89 fL (ref 79–97)
Monocytes Absolute: 0.8 10*3/uL (ref 0.1–0.9)
Monocytes: 9 %
Neutrophils Absolute: 5.3 10*3/uL (ref 1.4–7.0)
Neutrophils: 60 %
Platelets: 207 10*3/uL (ref 150–450)
RBC: 5.19 x10E6/uL (ref 4.14–5.80)
RDW: 12.8 % (ref 11.6–15.4)
WBC: 8.8 10*3/uL (ref 3.4–10.8)

## 2021-08-30 LAB — COMPREHENSIVE METABOLIC PANEL
ALT: 19 IU/L (ref 0–44)
AST: 17 IU/L (ref 0–40)
Albumin/Globulin Ratio: 1.6 (ref 1.2–2.2)
Albumin: 4.3 g/dL (ref 3.7–4.7)
Alkaline Phosphatase: 111 IU/L (ref 44–121)
BUN/Creatinine Ratio: 12 (ref 10–24)
BUN: 11 mg/dL (ref 8–27)
Bilirubin Total: 0.5 mg/dL (ref 0.0–1.2)
CO2: 25 mmol/L (ref 20–29)
Calcium: 9.3 mg/dL (ref 8.6–10.2)
Chloride: 102 mmol/L (ref 96–106)
Creatinine, Ser: 0.92 mg/dL (ref 0.76–1.27)
Globulin, Total: 2.7 g/dL (ref 1.5–4.5)
Glucose: 108 mg/dL — ABNORMAL HIGH (ref 70–99)
Potassium: 4.6 mmol/L (ref 3.5–5.2)
Sodium: 142 mmol/L (ref 134–144)
Total Protein: 7 g/dL (ref 6.0–8.5)
eGFR: 86 mL/min/{1.73_m2} (ref >59)

## 2021-08-30 MED ORDER — FINASTERIDE 5 MG PO TABS
ORAL_TABLET | ORAL | 11 refills | Status: DC
  Filled 2021-08-30 – 2022-03-05 (×2): qty 30, 30d supply, fill #0
  Filled 2022-04-02: qty 30, 30d supply, fill #1
  Filled 2022-08-15: qty 30, 30d supply, fill #2

## 2021-08-30 MED ORDER — TAMSULOSIN HCL 0.4 MG PO CAPS
0.8000 mg | ORAL_CAPSULE | Freq: Every day | ORAL | 11 refills | Status: DC
  Filled 2021-08-30 – 2022-03-05 (×2): qty 60, 30d supply, fill #0
  Filled 2022-04-02: qty 60, 30d supply, fill #1
  Filled 2022-05-07: qty 60, 30d supply, fill #2

## 2021-09-19 ENCOUNTER — Other Ambulatory Visit: Payer: Self-pay

## 2021-10-11 ENCOUNTER — Other Ambulatory Visit: Payer: Self-pay

## 2021-11-08 ENCOUNTER — Other Ambulatory Visit: Payer: Self-pay

## 2021-11-28 ENCOUNTER — Other Ambulatory Visit: Payer: Self-pay

## 2021-11-29 ENCOUNTER — Ambulatory Visit: Payer: Medicare Other | Admitting: Internal Medicine

## 2021-11-29 ENCOUNTER — Other Ambulatory Visit: Payer: Self-pay

## 2022-02-03 ENCOUNTER — Other Ambulatory Visit: Payer: Self-pay

## 2022-02-17 DIAGNOSIS — Z23 Encounter for immunization: Secondary | ICD-10-CM | POA: Diagnosis not present

## 2022-02-24 ENCOUNTER — Other Ambulatory Visit: Payer: Self-pay | Admitting: Family Medicine

## 2022-02-24 ENCOUNTER — Encounter: Payer: Self-pay | Admitting: Internal Medicine

## 2022-02-24 ENCOUNTER — Other Ambulatory Visit: Payer: Self-pay

## 2022-02-24 ENCOUNTER — Ambulatory Visit: Payer: Medicare Other | Attending: Internal Medicine | Admitting: Internal Medicine

## 2022-02-24 VITALS — BP 131/80 | HR 71 | Ht 67.0 in | Wt 216.6 lb

## 2022-02-24 DIAGNOSIS — J302 Other seasonal allergic rhinitis: Secondary | ICD-10-CM

## 2022-02-24 DIAGNOSIS — I10 Essential (primary) hypertension: Secondary | ICD-10-CM | POA: Insufficient documentation

## 2022-02-24 DIAGNOSIS — Z7984 Long term (current) use of oral hypoglycemic drugs: Secondary | ICD-10-CM | POA: Diagnosis not present

## 2022-02-24 DIAGNOSIS — N419 Inflammatory disease of prostate, unspecified: Secondary | ICD-10-CM | POA: Diagnosis not present

## 2022-02-24 DIAGNOSIS — N400 Enlarged prostate without lower urinary tract symptoms: Secondary | ICD-10-CM | POA: Insufficient documentation

## 2022-02-24 DIAGNOSIS — Z23 Encounter for immunization: Secondary | ICD-10-CM | POA: Diagnosis not present

## 2022-02-24 DIAGNOSIS — E1159 Type 2 diabetes mellitus with other circulatory complications: Secondary | ICD-10-CM

## 2022-02-24 DIAGNOSIS — E119 Type 2 diabetes mellitus without complications: Secondary | ICD-10-CM | POA: Diagnosis not present

## 2022-02-24 DIAGNOSIS — Z6833 Body mass index (BMI) 33.0-33.9, adult: Secondary | ICD-10-CM | POA: Insufficient documentation

## 2022-02-24 DIAGNOSIS — E669 Obesity, unspecified: Secondary | ICD-10-CM | POA: Diagnosis not present

## 2022-02-24 DIAGNOSIS — I152 Hypertension secondary to endocrine disorders: Secondary | ICD-10-CM

## 2022-02-24 DIAGNOSIS — J9801 Acute bronchospasm: Secondary | ICD-10-CM

## 2022-02-24 DIAGNOSIS — E1169 Type 2 diabetes mellitus with other specified complication: Secondary | ICD-10-CM

## 2022-02-24 DIAGNOSIS — E785 Hyperlipidemia, unspecified: Secondary | ICD-10-CM | POA: Insufficient documentation

## 2022-02-24 LAB — POCT GLYCOSYLATED HEMOGLOBIN (HGB A1C): HbA1c, POC (controlled diabetic range): 6.8 % (ref 0.0–7.0)

## 2022-02-24 LAB — GLUCOSE, POCT (MANUAL RESULT ENTRY): POC Glucose: 93 mg/dl (ref 70–99)

## 2022-02-24 MED ORDER — METFORMIN HCL 1000 MG PO TABS
1000.0000 mg | ORAL_TABLET | Freq: Two times a day (BID) | ORAL | 3 refills | Status: DC
  Filled 2022-02-24: qty 180, 90d supply, fill #0
  Filled 2022-05-07: qty 60, 30d supply, fill #0
  Filled 2022-07-10: qty 60, 30d supply, fill #1
  Filled 2022-08-28: qty 60, 30d supply, fill #2
  Filled 2022-10-17 – 2022-10-24 (×2): qty 60, 30d supply, fill #3
  Filled 2022-12-19: qty 60, 30d supply, fill #4
  Filled 2023-01-29 (×2): qty 60, 30d supply, fill #5

## 2022-02-24 MED ORDER — ZOSTER VAC RECOMB ADJUVANTED 50 MCG/0.5ML IM SUSR: 0.5000 mL | Freq: Once | INTRAMUSCULAR | 0 refills | Status: AC
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
## 2022-02-25 ENCOUNTER — Other Ambulatory Visit: Payer: Self-pay

## 2022-02-25 MED ORDER — ALBUTEROL SULFATE HFA 108 (90 BASE) MCG/ACT IN AERS
2.0000 | INHALATION_SPRAY | Freq: Four times a day (QID) | RESPIRATORY_TRACT | 1 refills | Status: DC | PRN
  Filled 2022-02-25: qty 6.7, 25d supply, fill #0
  Filled 2022-03-05: qty 8.5, 25d supply, fill #0
  Filled 2022-04-02: qty 8.5, 25d supply, fill #1

## 2022-02-25 MED ORDER — FLUTICASONE PROPIONATE 50 MCG/ACT NA SUSP
2.0000 | Freq: Every day | NASAL | 0 refills | Status: DC
  Filled 2022-02-25 – 2022-03-05 (×2): qty 16, 30d supply, fill #0

## 2022-02-26 ENCOUNTER — Other Ambulatory Visit: Payer: Self-pay

## 2022-03-03 DIAGNOSIS — N401 Enlarged prostate with lower urinary tract symptoms: Secondary | ICD-10-CM | POA: Diagnosis not present

## 2022-03-03 DIAGNOSIS — R3912 Poor urinary stream: Secondary | ICD-10-CM | POA: Diagnosis not present

## 2022-03-05 ENCOUNTER — Other Ambulatory Visit: Payer: Self-pay

## 2022-04-02 ENCOUNTER — Other Ambulatory Visit: Payer: Self-pay

## 2022-04-02 ENCOUNTER — Other Ambulatory Visit: Payer: Self-pay | Admitting: Internal Medicine

## 2022-04-02 DIAGNOSIS — J302 Other seasonal allergic rhinitis: Secondary | ICD-10-CM

## 2022-04-02 MED ORDER — FLUTICASONE PROPIONATE 50 MCG/ACT NA SUSP
2.0000 | Freq: Every day | NASAL | 2 refills | Status: DC
  Filled 2022-04-02: qty 16, 30d supply, fill #0
  Filled 2022-07-10: qty 16, 30d supply, fill #1

## 2022-04-03 ENCOUNTER — Other Ambulatory Visit: Payer: Self-pay

## 2022-04-03 MED ORDER — ALBUTEROL SULFATE HFA 108 (90 BASE) MCG/ACT IN AERS
INHALATION_SPRAY | RESPIRATORY_TRACT | 0 refills | Status: DC
  Filled 2022-04-03: qty 6.7, 25d supply, fill #0

## 2022-04-07 ENCOUNTER — Other Ambulatory Visit: Payer: Self-pay

## 2022-04-07 MED ORDER — FINASTERIDE 5 MG PO TABS
5.0000 mg | ORAL_TABLET | Freq: Every day | ORAL | 1 refills | Status: DC
  Filled 2022-05-07: qty 30, 30d supply, fill #0
  Filled 2022-06-13: qty 30, 30d supply, fill #1
  Filled 2022-07-10: qty 30, 30d supply, fill #2
  Filled 2022-09-12: qty 30, 30d supply, fill #3
  Filled 2022-10-17 – 2022-10-24 (×2): qty 30, 30d supply, fill #4
  Filled 2022-12-19: qty 30, 30d supply, fill #5

## 2022-05-07 ENCOUNTER — Other Ambulatory Visit: Payer: Self-pay

## 2022-05-07 ENCOUNTER — Other Ambulatory Visit: Payer: Self-pay | Admitting: Physician Assistant

## 2022-05-07 ENCOUNTER — Other Ambulatory Visit: Payer: Self-pay | Admitting: Internal Medicine

## 2022-05-07 DIAGNOSIS — E1169 Type 2 diabetes mellitus with other specified complication: Secondary | ICD-10-CM

## 2022-05-07 DIAGNOSIS — I10 Essential (primary) hypertension: Secondary | ICD-10-CM

## 2022-05-07 MED ORDER — LISINOPRIL-HYDROCHLOROTHIAZIDE 10-12.5 MG PO TABS
1.0000 | ORAL_TABLET | Freq: Every day | ORAL | 0 refills | Status: DC
  Filled 2022-05-07 – 2022-07-10 (×2): qty 90, 90d supply, fill #0

## 2022-05-07 MED ORDER — ALBUTEROL SULFATE HFA 108 (90 BASE) MCG/ACT IN AERS
2.0000 | INHALATION_SPRAY | Freq: Four times a day (QID) | RESPIRATORY_TRACT | 1 refills | Status: DC | PRN
  Filled 2022-05-07: qty 6.7, 20d supply, fill #0

## 2022-05-07 MED ORDER — ATORVASTATIN CALCIUM 40 MG PO TABS
40.0000 mg | ORAL_TABLET | Freq: Every day | ORAL | 1 refills | Status: DC
  Filled 2022-05-07 – 2022-05-16 (×2): qty 30, 30d supply, fill #0
  Filled 2022-06-13: qty 30, 30d supply, fill #1

## 2022-05-08 ENCOUNTER — Other Ambulatory Visit: Payer: Self-pay

## 2022-05-09 ENCOUNTER — Other Ambulatory Visit: Payer: Self-pay

## 2022-05-09 MED ORDER — TAMSULOSIN HCL 0.4 MG PO CAPS
0.8000 mg | ORAL_CAPSULE | Freq: Every day | ORAL | 1 refills | Status: DC
  Filled 2022-05-09 – 2022-07-10 (×2): qty 180, 90d supply, fill #0
  Filled 2022-09-12 (×2): qty 180, 90d supply, fill #1

## 2022-05-16 ENCOUNTER — Other Ambulatory Visit: Payer: Self-pay

## 2022-06-13 ENCOUNTER — Other Ambulatory Visit: Payer: Self-pay

## 2022-06-27 ENCOUNTER — Ambulatory Visit: Payer: Medicare Other | Admitting: Internal Medicine

## 2022-07-10 ENCOUNTER — Other Ambulatory Visit: Payer: Self-pay

## 2022-07-10 ENCOUNTER — Other Ambulatory Visit: Payer: Self-pay | Admitting: Internal Medicine

## 2022-07-10 DIAGNOSIS — E1169 Type 2 diabetes mellitus with other specified complication: Secondary | ICD-10-CM

## 2022-07-10 MED ORDER — ATORVASTATIN CALCIUM 40 MG PO TABS
40.0000 mg | ORAL_TABLET | Freq: Every day | ORAL | 0 refills | Status: DC
  Filled 2022-07-10: qty 90, 90d supply, fill #0

## 2022-07-10 NOTE — Telephone Encounter (Signed)
Requested Prescriptions  Pending Prescriptions Disp Refills   atorvastatin (LIPITOR) 40 MG tablet 90 tablet 0    Sig: Take 1 tablet (40 mg total) by mouth daily.     Cardiovascular:  Antilipid - Statins Failed - 07/10/2022  1:07 PM      Failed - Lipid Panel in normal range within the last 12 months    Cholesterol, Total  Date Value Ref Range Status  08/29/2021 146 100 - 199 mg/dL Final   LDL Chol Calc (NIH)  Date Value Ref Range Status  08/29/2021 66 0 - 99 mg/dL Final   HDL  Date Value Ref Range Status  08/29/2021 61 >39 mg/dL Final   Triglycerides  Date Value Ref Range Status  08/29/2021 102 0 - 149 mg/dL Final         Passed - Patient is not pregnant      Passed - Valid encounter within last 12 months    Recent Outpatient Visits           4 months ago Diabetes mellitus type 2 in obese [facility])   [place] Karle Plumber B, MD   10 months ago Diabetes mellitus type 2 in obese [facility])   [place], [place], [place]   1 year ago Diabetes mellitus type 2 in obese [facility])   [place] Ladell Pier, MD   2 years ago Need for influenza vaccination   [place], Tashua L, RPH-CPP   2 years ago Diabetes mellitus type 2 in obese [facility])   [place], MD       Future Appointments             In 2 months Wynetta Emery, Dalbert Batman, MD [place]

## 2022-07-29 ENCOUNTER — Telehealth: Payer: Self-pay | Admitting: Internal Medicine

## 2022-07-29 NOTE — Telephone Encounter (Signed)
Contacted Louis Peterson to schedule their annual wellness visit. Call back at later date: 09/03/22  [name] AWV direct phone # [phone]   Call back MAY 2024 [place]

## 2022-08-15 ENCOUNTER — Other Ambulatory Visit: Payer: Self-pay

## 2022-08-28 ENCOUNTER — Other Ambulatory Visit: Payer: Self-pay

## 2022-08-29 ENCOUNTER — Other Ambulatory Visit: Payer: Self-pay

## 2022-09-12 ENCOUNTER — Other Ambulatory Visit: Payer: Self-pay

## 2022-10-07 ENCOUNTER — Ambulatory Visit: Payer: Medicare Other | Admitting: Internal Medicine

## 2022-10-17 ENCOUNTER — Other Ambulatory Visit: Payer: Self-pay | Admitting: Internal Medicine

## 2022-10-17 ENCOUNTER — Other Ambulatory Visit: Payer: Self-pay

## 2022-10-17 DIAGNOSIS — E1169 Type 2 diabetes mellitus with other specified complication: Secondary | ICD-10-CM

## 2022-10-17 DIAGNOSIS — I10 Essential (primary) hypertension: Secondary | ICD-10-CM

## 2022-10-17 NOTE — Telephone Encounter (Signed)
Requested medication (s) are due for refill today: yes to both  Requested medication (s) are on the active medication list: yes  Last refill:  lisinopril-hydrochlorothiazide 05/07/22 #90              Atorvastatin: 07/20/22 #90  Future visit scheduled: no  Notes to clinic:  called pt with assistance of Spanish interpreter: Vicente Serene # [phone]- LM on VM to call office to make appt/Overdue lab work  and appt   Requested Prescriptions  Pending Prescriptions Disp Refills   lisinopril-hydrochlorothiazide (ZESTORETIC) 10-12.5 MG tablet 90 tablet 0    Sig: Take 1 tablet by mouth daily.     Cardiovascular:  ACEI + Diuretic Combos Failed - 10/17/2022 12:40 PM      Failed - Na in normal range and within 180 days    Sodium  Date Value Ref Range Status  08/29/2021 142 134 - 144 mmol/L Final         Failed - K in normal range and within 180 days    Potassium  Date Value Ref Range Status  08/29/2021 4.6 3.5 - 5.2 mmol/L Final         Failed - Cr in normal range and within 180 days    Creat  Date Value Ref Range Status  08/10/2015 0.86 0.70 - 1.18 mg/dL Final   Creatinine, Ser  Date Value Ref Range Status  08/29/2021 0.92 0.76 - 1.27 mg/dL Final         Failed - eGFR is 30 or above and within 180 days    GFR, Est African American  Date Value Ref Range Status  08/10/2015 >89 >=60 mL/min Final   GFR calc Af Amer  Date Value Ref Range Status  11/22/2019 93 >59 mL/min/1.73 Final    Comment:    **[facility] currently reports eGFR in compliance with the current**   recommendations of the [employer]. [facility] will   update reporting as new guidelines are published from the NKF-ASN   Task force.    GFR, Est Non African American  Date Value Ref Range Status  08/10/2015 88 >=60 mL/min Final    Comment:      The estimated GFR is a calculation valid for adults (>=34 years old) that uses the CKD-EPI algorithm to adjust for age and sex. It is   not to be used for children,  pregnant women, hospitalized patients,    patients on dialysis, or with rapidly changing kidney function. According to the NKDEP, eGFR >89 is normal, 60-89 shows mild impairment, 30-59 shows moderate impairment, 15-29 shows severe impairment and <15 is ESRD.      GFR calc non Af Amer  Date Value Ref Range Status  11/22/2019 81 >59 mL/min/1.73 Final   eGFR  Date Value Ref Range Status  08/29/2021 86 >59 mL/min/1.73 Final         Failed - Valid encounter within last 6 months    Recent Outpatient Visits           7 months ago Diabetes mellitus type 2 in obese [facility])   [place] [facility] Marcine Matar, MD   1 year ago Diabetes mellitus type 2 in obese [facility])   [place] [facility] [place], [place], [place]   1 year ago Diabetes mellitus type 2 in obese [facility])   [place] [facility] Jonah Blue B, MD   2 years ago Need for influenza vaccination  [facility] [place], Keddie L, RPH-CPP   2 years ago Diabetes mellitus type 2 in obese [facility])   [place] [facility] Marcine Matar, MD              Passed - Patient is not pregnant      Passed - Last BP in normal range    BP Readings from Last 1 Encounters:  02/24/22 131/80          atorvastatin (LIPITOR) 40 MG tablet 90 tablet 0    Sig: Take 1 tablet (40 mg total) by mouth daily.     Cardiovascular:  Antilipid - Statins Failed - 10/17/2022 12:40 PM      Failed - Lipid Panel in normal range within the last 12 months    Cholesterol, Total  Date Value Ref Range Status  08/29/2021 146 100 - 199 mg/dL Final   LDL Chol Calc (NIH)  Date Value Ref Range Status  08/29/2021 66 0 - 99 mg/dL Final   HDL  Date Value Ref Range Status  08/29/2021 61 >39 mg/dL Final   Triglycerides  Date Value Ref Range Status  08/29/2021 102 0 - 149 mg/dL Final         Passed - Patient is  not pregnant      Passed - Valid encounter within last 12 months    Recent Outpatient Visits           7 months ago Diabetes mellitus type 2 in obese [facility])   [place] [facility] Jonah Blue B, MD   1 year ago Diabetes mellitus type 2 in obese [facility])   [place] [facility] [place], [place], [place]   1 year ago Diabetes mellitus type 2 in obese [facility])   [place] [facility] Marcine Matar, MD   2 years ago Need for influenza vaccination   [facility] [place], Garrett L, RPH-CPP   2 years ago Diabetes mellitus type 2 in obese [facility])   [place] [facility] Marcine Matar, MD

## 2022-10-20 ENCOUNTER — Other Ambulatory Visit: Payer: Self-pay

## 2022-10-22 ENCOUNTER — Other Ambulatory Visit: Payer: Self-pay

## 2022-10-23 ENCOUNTER — Other Ambulatory Visit: Payer: Self-pay

## 2022-10-24 ENCOUNTER — Other Ambulatory Visit: Payer: Self-pay

## 2022-12-16 ENCOUNTER — Other Ambulatory Visit: Payer: Self-pay

## 2022-12-16 ENCOUNTER — Ambulatory Visit: Payer: Medicare Other

## 2022-12-16 ENCOUNTER — Telehealth: Payer: Self-pay

## 2022-12-16 DIAGNOSIS — E1169 Type 2 diabetes mellitus with other specified complication: Secondary | ICD-10-CM

## 2022-12-16 DIAGNOSIS — I10 Essential (primary) hypertension: Secondary | ICD-10-CM

## 2022-12-16 MED ORDER — LISINOPRIL-HYDROCHLOROTHIAZIDE 10-12.5 MG PO TABS
1.0000 | ORAL_TABLET | Freq: Every day | ORAL | 0 refills | Status: DC
  Filled 2022-12-16: qty 90, 90d supply, fill #0

## 2022-12-16 MED ORDER — ATORVASTATIN CALCIUM 40 MG PO TABS
40.0000 mg | ORAL_TABLET | Freq: Every day | ORAL | 0 refills | Status: DC
  Filled 2022-12-16: qty 90, 90d supply, fill #0

## 2022-12-16 NOTE — Telephone Encounter (Signed)
Patient scheduled for AWV by phone; interpreter used [phone].  Patient unable to complete visit via phone because he would not allow questions to be asked.  Only asking for refills of medications.  Notified patient that refills could only be sent if appointment was scheduled. Appointment scheduled for next available for October 28th @ 2:10.  Refills sent to pharmacy.

## 2022-12-19 ENCOUNTER — Other Ambulatory Visit: Payer: Self-pay

## 2023-01-29 ENCOUNTER — Other Ambulatory Visit: Payer: Self-pay

## 2023-01-29 MED ORDER — FINASTERIDE 5 MG PO TABS
5.0000 mg | ORAL_TABLET | Freq: Every evening | ORAL | 1 refills | Status: DC
  Filled 2023-01-29: qty 90, 90d supply, fill #0

## 2023-01-30 ENCOUNTER — Other Ambulatory Visit: Payer: Self-pay

## 2023-03-02 ENCOUNTER — Ambulatory Visit: Payer: Medicare Other | Admitting: Internal Medicine

## 2023-03-13 ENCOUNTER — Other Ambulatory Visit: Payer: Self-pay

## 2023-03-13 ENCOUNTER — Other Ambulatory Visit: Payer: Self-pay | Admitting: Internal Medicine

## 2023-03-13 DIAGNOSIS — E1169 Type 2 diabetes mellitus with other specified complication: Secondary | ICD-10-CM

## 2023-03-13 DIAGNOSIS — E669 Obesity, unspecified: Secondary | ICD-10-CM

## 2023-03-13 DIAGNOSIS — I10 Essential (primary) hypertension: Secondary | ICD-10-CM

## 2023-03-13 MED ORDER — TAMSULOSIN HCL 0.4 MG PO CAPS
0.8000 mg | ORAL_CAPSULE | Freq: Every day | ORAL | 1 refills | Status: DC
  Filled 2023-03-13: qty 180, 90d supply, fill #0

## 2023-03-16 ENCOUNTER — Other Ambulatory Visit: Payer: Self-pay

## 2023-03-17 ENCOUNTER — Other Ambulatory Visit: Payer: Self-pay

## 2023-03-24 ENCOUNTER — Other Ambulatory Visit: Payer: Self-pay

## 2023-05-30 NOTE — Progress Notes (Signed)
Established care OV     Patient: Louis Peterson, Male    DOB: 1945-10-06, 78 y.o.   MRN: [id number]  Subjective  Chief Complaint  Patient presents with   Medical Management of Chronic Issues    Tiredness and frequent urination     Louis Peterson is a 78 y.o. male who presents today    This is a 78 year old male seen with assistance by Spanish video interpreter Ardelle Anton [phone] Patient last seen in October 2023 by primary care Dr. Laural Benes today the patient comes in with continuous dysuria and frequency of urination urinating 4-5 times at night history of underlying type 2 diabetes A1c on arrival is greater than 12 the patient has not been taking metformin has been out of all medications for several months.  Blood pressure also was elevated on arrival.  He needs dental and eye care.  He has benign prostatic hypertrophy needs prostate cancer screening and resumption of his BPH medications.  Also needs to resume his atorvastatin needs lipid follow-up.          Medications: Outpatient Medications Prior to Visit  Medication Sig   docusate sodium (COLACE) 100 MG capsule Take 1 capsule (100 mg total) by mouth daily as needed for mild constipation.   Elastic Bandages & Supports (MEDICAL COMPRESSION SOCKS) MISC 1 each by Does not apply route daily. 30 mm Hg pressure   naproxen (NAPROSYN) 250 MG tablet Take 1 to 2 tablets by mouth twice a day as needed for pain.  Take with food   polyethylene glycol powder (GLYCOLAX/MIRALAX) 17 GM/SCOOP powder Take 17 g by mouth 2 (two) times daily as needed.   [DISCONTINUED] Accu-Chek Softclix Lancets lancets Check blood sugar once daily.   [DISCONTINUED] albuterol (VENTOLIN HFA) 108 (90 Base) MCG/ACT inhaler Inhale 2 puffs into the lungs every 6 (six) hours as needed for shortness of breath or wheezing.   [DISCONTINUED] atorvastatin (LIPITOR) 40 MG tablet Take 1 tablet (40 mg total) by mouth daily.   [DISCONTINUED] Blood Glucose Monitoring  Suppl (ACCU-CHEK GUIDE ME) w/Device KIT Check blood sugar once daily.   [DISCONTINUED] finasteride (PROSCAR) 5 MG tablet take 1 tablet by mouth daily   [DISCONTINUED] finasteride (PROSCAR) 5 MG tablet Take 1 tablet (5 mg total) by mouth every evening.   [DISCONTINUED] fluticasone (FLONASE) 50 MCG/ACT nasal spray Place 2 sprays into both nostrils daily.   [DISCONTINUED] glucose blood (ACCU-CHEK GUIDE) test strip Check blood sugar once daily.   [DISCONTINUED] lisinopril-hydrochlorothiazide (ZESTORETIC) 10-12.5 MG tablet Take 1 tablet by mouth daily.   [DISCONTINUED] metFORMIN (GLUCOPHAGE) 1000 MG tablet Take 1 tablet (1,000 mg total) by mouth 2 (two) times daily with a meal.   [DISCONTINUED] tamsulosin (FLOMAX) 0.4 MG CAPS capsule Take 2 capsules (0.8 mg total) by mouth at bedtime.   [DISCONTINUED] tamsulosin (FLOMAX) 0.4 MG CAPS capsule Take 2 capsules (0.8 mg total) by mouth at bedtime.   No facility-administered medications prior to visit.    No Known Allergies  Patient Care Team: Marcine Matar, MD as PCP - General (Internal Medicine)  Review of Systems  Constitutional:  Negative for chills, diaphoresis, fever, malaise/fatigue and weight loss.  HENT:  Negative for congestion, ear discharge, ear pain, hearing loss, nosebleeds, sore throat and tinnitus.   Eyes:  Negative for blurred vision, double vision, photophobia and discharge.  Respiratory:  Negative for cough, hemoptysis, sputum production, shortness of breath, wheezing and stridor.        No excess mucus  Cardiovascular:  Negative  for chest pain, palpitations, orthopnea, claudication, leg swelling and PND.  Gastrointestinal:  Negative for abdominal pain, blood in stool, constipation, diarrhea, heartburn, melena, nausea and vomiting.  Genitourinary:  Positive for dysuria, frequency and urgency. Negative for flank pain and hematuria.  Musculoskeletal:  Negative for back pain, falls, joint pain, myalgias and neck pain.  Skin:   Negative for itching and rash.  Neurological:  Negative for dizziness, tingling, tremors, sensory change, speech change, focal weakness, seizures, loss of consciousness, weakness and headaches.  Endo/Heme/Allergies:  Negative for environmental allergies and polydipsia. Does not bruise/bleed easily.  Psychiatric/Behavioral:  Negative for depression, hallucinations, memory loss, substance abuse and suicidal ideas. The patient is not nervous/anxious and does not have insomnia.   All other systems reviewed and are negative.       Objective  BP (!) 152/88 (BP Location: Right Arm, Patient Position: Sitting)   Pulse 70   Wt 198 lb 6.4 oz (90 kg)   SpO2 95%   BMI 31.07 kg/m    Physical Exam Vitals reviewed.  Constitutional:      Appearance: Normal appearance. He is well-developed. He is not diaphoretic.  HENT:     Head: Normocephalic and atraumatic.     Nose: No nasal deformity, septal deviation, mucosal edema or rhinorrhea.     Right Sinus: No maxillary sinus tenderness or frontal sinus tenderness.     Left Sinus: No maxillary sinus tenderness or frontal sinus tenderness.     Mouth/Throat:     Pharynx: No oropharyngeal exudate.  Eyes:     General: No scleral icterus.    Conjunctiva/sclera: Conjunctivae normal.     Pupils: Pupils are equal, round, and reactive to light.  Neck:     Thyroid: No thyromegaly.     Vascular: No carotid bruit or JVD.     Trachea: Trachea normal. No tracheal tenderness or tracheal deviation.  Cardiovascular:     Rate and Rhythm: Normal rate and regular rhythm.     Chest Wall: PMI is not displaced.     Pulses: Normal pulses. No decreased pulses.     Heart sounds: Normal heart sounds, S1 normal and S2 normal. Heart sounds not distant. No murmur heard.    No systolic murmur is present.     No diastolic murmur is present.     No friction rub. No gallop. No S3 or S4 sounds.  Pulmonary:     Effort: No tachypnea, accessory muscle usage or respiratory  distress.     Breath sounds: No stridor. No decreased breath sounds, wheezing, rhonchi or rales.  Chest:     Chest wall: No tenderness.  Abdominal:     General: Bowel sounds are normal. There is no distension.     Palpations: Abdomen is soft. Abdomen is not rigid.     Tenderness: There is no abdominal tenderness. There is no guarding or rebound.  Musculoskeletal:        General: Normal range of motion.     Cervical back: Normal range of motion and neck supple. No edema, erythema or rigidity. No muscular tenderness. Normal range of motion.     Comments: Foot exam done there is decreased sensation anteriorly in both feet pulses are good  Lymphadenopathy:     Head:     Right side of head: No submental or submandibular adenopathy.     Left side of head: No submental or submandibular adenopathy.     Cervical: No cervical adenopathy.  Skin:    General: Skin is  warm and dry.     Coloration: Skin is not pale.     Findings: No rash.     Nails: There is no clubbing.  Neurological:     Mental Status: He is alert and oriented to person, place, and time.     Sensory: No sensory deficit.  Psychiatric:        Speech: Speech normal.        Behavior: Behavior normal.      Assessment & Plan     Immunization History  Administered Date(s) Administered   Influenza Whole 03/11/2010   Influenza,inj,Quad PF,6+ Mos 02/08/2014, 05/13/2016, 02/20/2020, 04/08/2021   Influenza-Unspecified 02/18/2022   PFIZER(Purple Top)SARS-COV-2 Vaccination 08/17/2019, 10/31/2019   PNEUMOCOCCAL CONJUGATE-20 04/08/2021   PPD Test 06/09/2016   Pneumococcal Polysaccharide-23 12/02/2015   Tdap 02/24/2022    Health Maintenance  Topic Date Due   Medicare Annual Wellness (AWV)  Never done   Zoster Vaccines- Shingrix (1 of 2) Never done   OPHTHALMOLOGY EXAM  08/27/2016   Diabetic kidney evaluation - Urine ACR  04/08/2022   HEMOGLOBIN A1C  08/26/2022   Diabetic kidney evaluation - eGFR measurement  08/30/2022    INFLUENZA VACCINE  12/04/2022   COVID-19 Vaccine (3 - 2024-25 season) 01/04/2023   FOOT EXAM  06/02/2024   DTaP/Tdap/Td (2 - Td or Tdap) 02/25/2032   Pneumonia Vaccine 67+ Years old  Completed   Hepatitis C Screening  Completed   HPV VACCINES  Aged Out      Problem List Items Addressed This Visit       Cardiovascular and Mediastinum   Essential hypertension   Will resume blood pressure medication and check labs      Relevant Medications   atorvastatin (LIPITOR) 40 MG tablet   lisinopril-hydrochlorothiazide (ZESTORETIC) 10-12.5 MG tablet     Endocrine   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)   Diabetes uncontrolled start Trulicity 0.75 mg weekly and continue metformin as prescribed follow-up short-term with clinical pharmacy      Relevant Medications   metFORMIN (GLUCOPHAGE) 1000 MG tablet   atorvastatin (LIPITOR) 40 MG tablet   lisinopril-hydrochlorothiazide (ZESTORETIC) 10-12.5 MG tablet   Dulaglutide (TRULICITY) 0.75 MG/0.5ML SOAJ   Other Relevant Orders   Microalbumin / creatinine urine ratio   Comprehensive metabolic panel   HgB A1c   POCT glucose (manual entry)   Ambulatory referral to Dentistry   Hyperlipidemia associated with type 2 diabetes mellitus (HCC)   Reassess lipids resume statin      Relevant Medications   metFORMIN (GLUCOPHAGE) 1000 MG tablet   atorvastatin (LIPITOR) 40 MG tablet   lisinopril-hydrochlorothiazide (ZESTORETIC) 10-12.5 MG tablet   Dulaglutide (TRULICITY) 0.75 MG/0.5ML SOAJ   Other Relevant Orders   Lipid panel     Genitourinary   BPH (benign prostatic hyperplasia) (Chronic)   Referral back to urology screening for prostate cancer and resume tamsulosin and finasteride check urinalysis      Relevant Medications   finasteride (PROSCAR) 5 MG tablet   tamsulosin (FLOMAX) 0.4 MG CAPS capsule   Other Relevant Orders   Urinalysis   Ambulatory referral to Urology   PSA     Other   Seasonal allergies    Relevant Medications   fluticasone (FLONASE) 50 MCG/ACT nasal spray   Other Visit Diagnoses       Long-term current use of injectable noninsulin antidiabetic medication         Diabetes mellitus treated with oral medication (HCC)  Relevant Medications   metFORMIN (GLUCOPHAGE) 1000 MG tablet   atorvastatin (LIPITOR) 40 MG tablet   lisinopril-hydrochlorothiazide (ZESTORETIC) 10-12.5 MG tablet   Dulaglutide (TRULICITY) 0.75 MG/0.5ML SOAJ       Return in about 3 months (around 09/01/2023) for primary care follow up, htn, diabetes.     Shan Levans, MD

## 2023-06-03 ENCOUNTER — Other Ambulatory Visit: Payer: Self-pay | Admitting: Pharmacist

## 2023-06-03 ENCOUNTER — Ambulatory Visit: Payer: Medicare Other | Attending: Critical Care Medicine | Admitting: Critical Care Medicine

## 2023-06-03 ENCOUNTER — Other Ambulatory Visit: Payer: Self-pay

## 2023-06-03 ENCOUNTER — Encounter: Payer: Self-pay | Admitting: Critical Care Medicine

## 2023-06-03 VITALS — BP 152/88 | HR 70 | Wt 198.4 lb

## 2023-06-03 DIAGNOSIS — N401 Enlarged prostate with lower urinary tract symptoms: Secondary | ICD-10-CM | POA: Insufficient documentation

## 2023-06-03 DIAGNOSIS — Z7984 Long term (current) use of oral hypoglycemic drugs: Secondary | ICD-10-CM | POA: Insufficient documentation

## 2023-06-03 DIAGNOSIS — E1165 Type 2 diabetes mellitus with hyperglycemia: Secondary | ICD-10-CM | POA: Diagnosis not present

## 2023-06-03 DIAGNOSIS — R300 Dysuria: Secondary | ICD-10-CM | POA: Diagnosis not present

## 2023-06-03 DIAGNOSIS — J302 Other seasonal allergic rhinitis: Secondary | ICD-10-CM

## 2023-06-03 DIAGNOSIS — E785 Hyperlipidemia, unspecified: Secondary | ICD-10-CM | POA: Insufficient documentation

## 2023-06-03 DIAGNOSIS — Z6831 Body mass index (BMI) 31.0-31.9, adult: Secondary | ICD-10-CM | POA: Insufficient documentation

## 2023-06-03 DIAGNOSIS — E669 Obesity, unspecified: Secondary | ICD-10-CM | POA: Insufficient documentation

## 2023-06-03 DIAGNOSIS — Z91148 Patient's other noncompliance with medication regimen for other reason: Secondary | ICD-10-CM | POA: Diagnosis not present

## 2023-06-03 DIAGNOSIS — K029 Dental caries, unspecified: Secondary | ICD-10-CM

## 2023-06-03 DIAGNOSIS — E1169 Type 2 diabetes mellitus with other specified complication: Secondary | ICD-10-CM | POA: Diagnosis not present

## 2023-06-03 DIAGNOSIS — R5383 Other fatigue: Secondary | ICD-10-CM | POA: Diagnosis present

## 2023-06-03 DIAGNOSIS — I10 Essential (primary) hypertension: Secondary | ICD-10-CM | POA: Insufficient documentation

## 2023-06-03 DIAGNOSIS — Z7985 Long-term (current) use of injectable non-insulin antidiabetic drugs: Secondary | ICD-10-CM | POA: Diagnosis not present

## 2023-06-03 DIAGNOSIS — E119 Type 2 diabetes mellitus without complications: Secondary | ICD-10-CM | POA: Diagnosis not present

## 2023-06-03 DIAGNOSIS — T383X6A Underdosing of insulin and oral hypoglycemic [antidiabetic] drugs, initial encounter: Secondary | ICD-10-CM | POA: Diagnosis not present

## 2023-06-03 DIAGNOSIS — R350 Frequency of micturition: Secondary | ICD-10-CM | POA: Insufficient documentation

## 2023-06-03 MED ORDER — FLUTICASONE PROPIONATE 50 MCG/ACT NA SUSP
2.0000 | Freq: Every day | NASAL | 2 refills | Status: AC
  Filled 2023-06-03: qty 16, 30d supply, fill #0

## 2023-06-03 MED ORDER — LISINOPRIL-HYDROCHLOROTHIAZIDE 10-12.5 MG PO TABS
1.0000 | ORAL_TABLET | Freq: Every day | ORAL | 1 refills | Status: DC
  Filled 2023-06-03: qty 90, 90d supply, fill #0
  Filled 2023-09-01: qty 90, 90d supply, fill #1

## 2023-06-03 MED ORDER — ACCU-CHEK SOFTCLIX LANCETS MISC: 6 refills | Status: DC

## 2023-06-03 MED ORDER — ALBUTEROL SULFATE HFA 108 (90 BASE) MCG/ACT IN AERS
2.0000 | INHALATION_SPRAY | Freq: Four times a day (QID) | RESPIRATORY_TRACT | 1 refills | Status: AC | PRN
  Filled 2023-06-03: qty 18, 25d supply, fill #0

## 2023-06-03 MED ORDER — ACCU-CHEK SOFTCLIX LANCETS MISC
2 refills | Status: DC
  Filled 2023-06-03: qty 100, 30d supply, fill #0

## 2023-06-03 MED ORDER — ACCU-CHEK GUIDE ME W/DEVICE KIT
PACK | 0 refills | Status: DC
  Filled 2023-06-03: qty 1, 30d supply, fill #0

## 2023-06-03 MED ORDER — ACCU-CHEK GUIDE ME W/DEVICE KIT: PACK | 0 refills | Status: DC

## 2023-06-03 MED ORDER — ACCU-CHEK GUIDE TEST VI STRP: ORAL_STRIP | 6 refills | Status: DC

## 2023-06-03 MED ORDER — TAMSULOSIN HCL 0.4 MG PO CAPS
0.8000 mg | ORAL_CAPSULE | Freq: Every day | ORAL | 1 refills | Status: AC
  Filled 2023-06-03: qty 180, 90d supply, fill #0
  Filled 2023-09-01: qty 180, 90d supply, fill #1

## 2023-06-03 MED ORDER — METFORMIN HCL 1000 MG PO TABS
1000.0000 mg | ORAL_TABLET | Freq: Two times a day (BID) | ORAL | 3 refills | Status: DC
  Filled 2023-06-03: qty 180, 90d supply, fill #0
  Filled 2023-09-01: qty 180, 90d supply, fill #1
  Filled 2023-11-27: qty 180, 90d supply, fill #2
  Filled 2024-02-16: qty 180, 90d supply, fill #3

## 2023-06-03 MED ORDER — ATORVASTATIN CALCIUM 40 MG PO TABS
40.0000 mg | ORAL_TABLET | Freq: Every day | ORAL | 0 refills | Status: DC
  Filled 2023-06-03: qty 90, 90d supply, fill #0

## 2023-06-03 MED ORDER — FINASTERIDE 5 MG PO TABS
5.0000 mg | ORAL_TABLET | Freq: Every evening | ORAL | 1 refills | Status: AC
  Filled 2023-06-03: qty 90, 90d supply, fill #0
  Filled 2023-09-01: qty 90, 90d supply, fill #1

## 2023-06-03 MED ORDER — TRULICITY 0.75 MG/0.5ML ~~LOC~~ SOAJ
0.7500 mg | SUBCUTANEOUS | 4 refills | Status: DC
  Filled 2023-06-03: qty 2, 28d supply, fill #0

## 2023-06-03 NOTE — Progress Notes (Signed)
257.

## 2023-06-03 NOTE — Assessment & Plan Note (Signed)
Reassess lipids resume statin

## 2023-06-03 NOTE — Assessment & Plan Note (Signed)
Will resume blood pressure medication and check labs

## 2023-06-03 NOTE — Assessment & Plan Note (Signed)
Diabetes uncontrolled start Trulicity 0.75 mg weekly and continue metformin as prescribed follow-up short-term with clinical pharmacy

## 2023-06-03 NOTE — Patient Instructions (Addendum)
All medications refilled Start Trulicity one injection weekly for diabetes New glucose meter issued Labs and urine sample today Dental referral sent Get eye exam for diabetes Urology referral sent to see them again  [facility] medicamentos reabastecidos Inicie Trulicity una inyeccin semanal para la diabetes [place] medidor de glucosa emitido [place] de laboratorio y [place]. [facility] dental enviada Hgase un examen de la vista para detectar diabetes Se enva derivacin de urologa para volver a verlos.

## 2023-06-03 NOTE — Assessment & Plan Note (Signed)
Referral back to urology screening for prostate cancer and resume tamsulosin and finasteride check urinalysis

## 2023-06-04 ENCOUNTER — Other Ambulatory Visit: Payer: Self-pay

## 2023-06-04 LAB — POCT GLYCOSYLATED HEMOGLOBIN (HGB A1C): HbA1c, POC (controlled diabetic range): 12.9 % — AB (ref 0.0–7.0)

## 2023-06-04 LAB — GLUCOSE, POCT (MANUAL RESULT ENTRY): POC Glucose: 257 mg/dL — AB (ref 70–99)

## 2023-06-05 LAB — COMPREHENSIVE METABOLIC PANEL
ALT: 21 [IU]/L (ref 0–44)
AST: 13 [IU]/L (ref 0–40)
Albumin: 4.2 g/dL (ref 3.8–4.8)
Alkaline Phosphatase: 154 [IU]/L — ABNORMAL HIGH (ref 44–121)
BUN/Creatinine Ratio: 12 (ref 10–24)
BUN: 10 mg/dL (ref 8–27)
Bilirubin Total: 0.6 mg/dL (ref 0.0–1.2)
CO2: 25 mmol/L (ref 20–29)
Calcium: 9.6 mg/dL (ref 8.6–10.2)
Chloride: 99 mmol/L (ref 96–106)
Creatinine, Ser: 0.83 mg/dL (ref 0.76–1.27)
Globulin, Total: 2.8 g/dL (ref 1.5–4.5)
Glucose: 285 mg/dL — ABNORMAL HIGH (ref 70–99)
Potassium: 4.7 mmol/L (ref 3.5–5.2)
Sodium: 139 mmol/L (ref 134–144)
Total Protein: 7 g/dL (ref 6.0–8.5)
eGFR: 90 mL/min/{1.73_m2} (ref >59)

## 2023-06-05 LAB — LIPID PANEL
Chol/HDL Ratio: 2.9 {ratio} (ref 0.0–5.0)
Cholesterol, Total: 152 mg/dL (ref 100–199)
HDL: 52 mg/dL (ref >39)
LDL Chol Calc (NIH): 84 mg/dL (ref 0–99)
Triglycerides: 86 mg/dL (ref 0–149)
VLDL Cholesterol Cal: 16 mg/dL (ref 5–40)

## 2023-06-05 LAB — PSA: Prostate Specific Ag, Serum: 3.9 ng/mL (ref 0.0–4.0)

## 2023-06-05 LAB — URINALYSIS
Bilirubin, UA: NEGATIVE
Nitrite, UA: NEGATIVE
RBC, UA: NEGATIVE
Specific Gravity, UA: >=1.03 — AB (ref 1.005–1.030)
Urobilinogen, Ur: 0.2 mg/dL (ref 0.2–1.0)
pH, UA: 5.5 (ref 5.0–7.5)

## 2023-06-05 LAB — MICROALBUMIN / CREATININE URINE RATIO
Creatinine, Urine: 227.2 mg/dL
Microalb/Creat Ratio: 14 mg/g{creat} (ref 0–29)
Microalbumin, Urine: 31 ug/mL

## 2023-06-07 NOTE — Progress Notes (Signed)
Let pt know liver kidney normal Urine shows sugar and mild ketones consistent with poorly controlled DM Urine also shows normal protein so no kidney damage from DM Cholesterol good   no prostate cancer

## 2023-06-08 ENCOUNTER — Telehealth: Payer: Self-pay

## 2023-06-08 NOTE — Telephone Encounter (Signed)
-----   Message from Shan Levans sent at 06/07/2023  7:48 AM EST ----- Let pt know liver kidney normal Urine shows sugar and mild ketones consistent with poorly controlled DM Urine also shows normal protein so no kidney damage from DM Cholesterol good   no prostate cancer

## 2023-06-08 NOTE — Telephone Encounter (Signed)
Pt was called and is aware of results, DOB was confirmed.   Patient was concerned of side affect from the Trulicity, I told patient he can try one week and if he has any symptoms to update [place] to switch medication.   Interpreter id # [id number]

## 2023-06-09 ENCOUNTER — Other Ambulatory Visit: Payer: Self-pay | Admitting: Pharmacist

## 2023-06-09 DIAGNOSIS — E669 Obesity, unspecified: Secondary | ICD-10-CM

## 2023-06-09 MED ORDER — ACCU-CHEK GUIDE TEST VI STRP: ORAL_STRIP | 6 refills | Status: AC

## 2023-06-09 MED ORDER — ACCU-CHEK GUIDE ME W/DEVICE KIT: PACK | 0 refills | Status: AC

## 2023-06-09 MED ORDER — ACCU-CHEK SOFTCLIX LANCETS MISC: 6 refills | Status: AC

## 2023-06-19 ENCOUNTER — Other Ambulatory Visit: Payer: Self-pay

## 2023-06-19 DIAGNOSIS — R3912 Poor urinary stream: Secondary | ICD-10-CM | POA: Diagnosis not present

## 2023-06-19 DIAGNOSIS — N401 Enlarged prostate with lower urinary tract symptoms: Secondary | ICD-10-CM | POA: Diagnosis not present

## 2023-06-19 MED ORDER — TAMSULOSIN HCL 0.4 MG PO CAPS
0.8000 mg | ORAL_CAPSULE | Freq: Every day | ORAL | 3 refills | Status: AC
  Filled 2024-02-16: qty 180, 90d supply, fill #0
  Filled 2024-05-25: qty 180, 90d supply, fill #1

## 2023-06-19 MED ORDER — FINASTERIDE 5 MG PO TABS
5.0000 mg | ORAL_TABLET | Freq: Every day | ORAL | 3 refills | Status: AC
  Filled 2023-11-27: qty 90, 90d supply, fill #0
  Filled 2024-02-16: qty 90, 90d supply, fill #1
  Filled 2024-05-25: qty 90, 90d supply, fill #2

## 2023-07-23 ENCOUNTER — Ambulatory Visit: Payer: Medicare Other | Admitting: Pharmacist

## 2023-08-13 ENCOUNTER — Ambulatory Visit: Attending: Internal Medicine | Admitting: Pharmacist

## 2023-08-13 ENCOUNTER — Encounter: Payer: Self-pay | Admitting: Pharmacist

## 2023-08-13 ENCOUNTER — Other Ambulatory Visit: Payer: Self-pay

## 2023-08-13 DIAGNOSIS — E785 Hyperlipidemia, unspecified: Secondary | ICD-10-CM | POA: Diagnosis not present

## 2023-08-13 DIAGNOSIS — E119 Type 2 diabetes mellitus without complications: Secondary | ICD-10-CM | POA: Diagnosis not present

## 2023-08-13 DIAGNOSIS — N400 Enlarged prostate without lower urinary tract symptoms: Secondary | ICD-10-CM | POA: Insufficient documentation

## 2023-08-13 DIAGNOSIS — E1169 Type 2 diabetes mellitus with other specified complication: Secondary | ICD-10-CM

## 2023-08-13 DIAGNOSIS — M5430 Sciatica, unspecified side: Secondary | ICD-10-CM | POA: Diagnosis not present

## 2023-08-13 DIAGNOSIS — Z7984 Long term (current) use of oral hypoglycemic drugs: Secondary | ICD-10-CM | POA: Insufficient documentation

## 2023-08-13 DIAGNOSIS — I10 Essential (primary) hypertension: Secondary | ICD-10-CM | POA: Insufficient documentation

## 2023-08-13 DIAGNOSIS — I872 Venous insufficiency (chronic) (peripheral): Secondary | ICD-10-CM | POA: Diagnosis not present

## 2023-08-13 DIAGNOSIS — B351 Tinea unguium: Secondary | ICD-10-CM | POA: Insufficient documentation

## 2023-08-13 DIAGNOSIS — E669 Obesity, unspecified: Secondary | ICD-10-CM | POA: Diagnosis not present

## 2023-08-13 DIAGNOSIS — Z79899 Other long term (current) drug therapy: Secondary | ICD-10-CM | POA: Diagnosis not present

## 2023-08-13 LAB — POCT GLYCOSYLATED HEMOGLOBIN (HGB A1C): HbA1c, POC (controlled diabetic range): 8.1 % — AB (ref 0.0–7.0)

## 2023-08-13 MED ORDER — ATORVASTATIN CALCIUM 40 MG PO TABS
40.0000 mg | ORAL_TABLET | Freq: Every day | ORAL | 0 refills | Status: DC
  Filled 2023-08-13 – 2023-09-01 (×2): qty 90, 90d supply, fill #0

## 2023-08-13 MED ORDER — DAPAGLIFLOZIN PROPANEDIOL 10 MG PO TABS
10.0000 mg | ORAL_TABLET | Freq: Every day | ORAL | 2 refills | Status: DC
  Filled 2023-08-13: qty 30, 30d supply, fill #0
  Filled 2023-09-18: qty 30, 30d supply, fill #1
  Filled 2023-11-27: qty 30, 30d supply, fill #2

## 2023-08-13 NOTE — Progress Notes (Signed)
 S:     No chief complaint on file.  78 y.o. male who presents for diabetes evaluation, education, and management. Patient arrives in good spirits and presents with his daughter. Patient was referred and last seen by Dr. Delford Field on 06/03/2023. A1c at that visit was 12.9 (up from 6.8 prior).  PMH is significant for T2DM w/ obesity, HTN, HLD, sciatica, venous stasis or LLE, BPH, onychomycosis, and seasonal allergies. Patient reports Diabetes is longstanding. No hx of pancreatitis or thyroid cancer. Does not have any known CAD/ACS, stroke, CHF, or CKD hx.  Family/Social History:  -FHX: negative for CAD, T2DM -Tobacco: never smoker  -Alcohol: none reported   Current diabetes medications include: metformin 1000 mg BID, Trulicity 0.75 mg weekly (not taking) Current hypertension medications include: lisinopril-hydrochlorothiazide 10-12.5 mg daily Current hyperlipidemia medications include: atorvastatin 40 mg daily  Patient reports adherence to taking all medications as prescribed with the exception of Trulicity. Did not start for fear of GI side effects.  Insurance coverage: Medicare and Medicaid  Patient denies hypoglycemic events.  Reported home fasting blood sugars: 110 - 120 Reported 2 hour post-meal/random blood sugars: not checking.  Patient reports nocturia (nighttime urination).  Patient denies neuropathy (nerve pain). Patient reports visual changes. Patient reports self foot exams.   Patient reported dietary habits: Eats 2-3 meals/day -Green juice in the morning -Eats lunch and dinner  Patient-reported exercise habits:  -Walks ~1 hour daily  O:  Lab Results  Component Value Date   HGBA1C 8.1 (A) 08/13/2023   There were no vitals filed for this visit.  Lipid Panel     Component Value Date/Time   CHOL 152 06/03/2023 1146   TRIG 86 06/03/2023 1146   HDL 52 06/03/2023 1146   CHOLHDL 2.9 06/03/2023 1146   CHOLHDL 3.8 08/10/2015 1135   VLDL 29 08/10/2015 1135    LDLCALC 84 06/03/2023 1146    Clinical Atherosclerotic Cardiovascular Disease (ASCVD): No  The 10-year ASCVD risk score (Arnett DK, et al., 2019) is: 62.9%   Values used to calculate the score:     Age: 44 years     Sex: Male     Is Non-Hispanic African American: No     Diabetic: Yes     Tobacco smoker: No     Systolic Blood Pressure: 152 mmHg     Is BP treated: Yes     HDL Cholesterol: 52 mg/dL     Total Cholesterol: 152 mg/dL   Patient is participating in a Managed Medicaid Plan: No   A/P: Diabetes longstanding currently above goal but improved since Jan! A1c 8.1 today (down from 12.9%). Patient is symptomatic today with polyuria, blurred vision. He is not hypoglycemic currently but is able to verbalize appropriate hypoglycemia management plan. Medication adherence appears to be appropriate with metformin but he is not taking Trulicity. We will discontinue this from his list and add Farxiga instead. -Continued metformin 1000 mg BID.  -Discontinue Trulicity per patient preference.  -Start Farxiga 10 mg daily.  -Patient educated on purpose, proper use, and potential adverse effects of [place].  -Extensively discussed pathophysiology of diabetes, recommended lifestyle interventions, dietary effects on blood sugar control.  -Counseled on s/sx of and management of hypoglycemia.  -Next A1c anticipated 11/2023.   ASCVD risk - primary prevention in patient with diabetes. Last LDL is not at goal of <70 mg/dL. 10-year ASCVD risk is extremely high. High intensity statin indicated. Did not fill this consistently in 2024. Current fills are uptodate. He is due  another statin refill at the end of this month. We will check lipids at next visit. -Continued atorvastatin 40 mg.   Hypertension longstanding currently uncontrolled given last clinic BP. I did not have time to assess BP control today. Blood pressure goal of <130/80 mmHg. Medication adherence looks good. He is taking a RAAS agent. uACR from  06/03/23 is nl. Will evaluate next month. -Continued lisinopril-hydrochlorothiazide 10-12.5 mg daily for now.  Written patient instructions provided. Patient verbalized understanding of treatment plan.  Total time in face to face counseling 30 minutes.    Follow-up:  Pharmacist in 1 month.   Butch Penny, PharmD, P[place]y [place], CPP Clinical Pharmacist [facility] [phone]

## 2023-09-01 ENCOUNTER — Other Ambulatory Visit: Payer: Self-pay

## 2023-09-02 ENCOUNTER — Other Ambulatory Visit: Payer: Self-pay

## 2023-09-18 ENCOUNTER — Other Ambulatory Visit: Payer: Self-pay

## 2023-09-18 ENCOUNTER — Ambulatory Visit: Attending: Internal Medicine | Admitting: Pharmacist

## 2023-09-18 ENCOUNTER — Encounter: Payer: Self-pay | Admitting: Pharmacist

## 2023-09-18 DIAGNOSIS — E669 Obesity, unspecified: Secondary | ICD-10-CM | POA: Insufficient documentation

## 2023-09-18 DIAGNOSIS — E1169 Type 2 diabetes mellitus with other specified complication: Secondary | ICD-10-CM | POA: Diagnosis not present

## 2023-09-18 DIAGNOSIS — Z713 Dietary counseling and surveillance: Secondary | ICD-10-CM | POA: Insufficient documentation

## 2023-09-18 DIAGNOSIS — Z79899 Other long term (current) drug therapy: Secondary | ICD-10-CM | POA: Insufficient documentation

## 2023-09-18 DIAGNOSIS — E785 Hyperlipidemia, unspecified: Secondary | ICD-10-CM | POA: Insufficient documentation

## 2023-09-18 DIAGNOSIS — N400 Enlarged prostate without lower urinary tract symptoms: Secondary | ICD-10-CM | POA: Diagnosis not present

## 2023-09-18 DIAGNOSIS — I10 Essential (primary) hypertension: Secondary | ICD-10-CM | POA: Diagnosis not present

## 2023-09-18 DIAGNOSIS — Z7984 Long term (current) use of oral hypoglycemic drugs: Secondary | ICD-10-CM | POA: Insufficient documentation

## 2023-09-18 DIAGNOSIS — B351 Tinea unguium: Secondary | ICD-10-CM | POA: Diagnosis not present

## 2023-09-18 DIAGNOSIS — I872 Venous insufficiency (chronic) (peripheral): Secondary | ICD-10-CM | POA: Insufficient documentation

## 2023-09-18 NOTE — Progress Notes (Signed)
 S:     No chief complaint on file.  78 y.o. male who presents for diabetes evaluation, education, and management. Patient arrives in good spirits and presents with his daughter. Patient was referred and last seen by Dr. Brent Cambric on 06/03/2023. A1c at that visit was 12.9 (up from 6.8 prior).  PMH is significant for T2DM w/ obesity, HTN, HLD, sciatica, venous stasis or LLE, BPH, onychomycosis, and seasonal allergies. Patient reports Diabetes is longstanding. No hx of pancreatitis or thyroid  cancer. Does not have any known CAD/ACS, stroke, CHF, or CKD hx.  I last saw him on 08/13/2023. At that visit, his A1c was down to 8.1 (down from 12.9). Of note, he reported adherence to all medications except for Trulicity . He did not start this for fear of GI side effects. We added Farxiga  instead and continued metformin .   Family/Social History:  -FHX: negative for CAD, T2DM -Tobacco: never smoker  -Alcohol : none reported   Current diabetes medications include: metformin  1000 mg BID, Farxiga  10 mg daily Current hypertension medications include: lisinopril -hydrochlorothiazide  10-12.5 mg daily Current hyperlipidemia medications include: atorvastatin  40 mg daily  Patient reports adherence to taking all medications as prescribed.  Insurance coverage: Medicare and Medicaid  Patient denies hypoglycemic events.  Reported home fasting blood sugars: 110 - 120 Reported 2 hour post-meal/random blood sugars: not checking.  Patient denies polyuria. Patient denies neuropathy (nerve pain). Patient denies any visual changes since his last visit with me. Patient reports self foot exams.   Patient reported dietary habits: Eats 2-3 meals/day -Green juice in the morning -Eats lunch and dinner  Patient-reported exercise habits:  -Walks ~1 hour daily  O:  Lab Results  Component Value Date   HGBA1C 8.1 (A) 08/13/2023   There were no vitals filed for this visit.  Lipid Panel     Component Value  Date/Time   CHOL 152 06/03/2023 1146   TRIG 86 06/03/2023 1146   HDL 52 06/03/2023 1146   CHOLHDL 2.9 06/03/2023 1146   CHOLHDL 3.8 08/10/2015 1135   VLDL 29 08/10/2015 1135   LDLCALC 84 06/03/2023 1146    Clinical Atherosclerotic Cardiovascular Disease (ASCVD): No  The 10-year ASCVD risk score (Arnett DK, et al., 2019) is: 62.9%   Values used to calculate the score:     Age: 51 years     Sex: Male     Is Non-Hispanic African American: No     Diabetic: Yes     Tobacco smoker: No     Systolic Blood Pressure: 152 mmHg     Is BP treated: Yes     HDL Cholesterol: 52 mg/dL     Total Cholesterol: 152 mg/dL   Patient is participating in a Managed Medicaid Plan: No   A/P: Diabetes longstanding currently above goal but improved since Jan! A1c 8.1 last month (down from 12.9%). Patient is asymptomatic - symptoms of hyperglycemia have improved. He is not hypoglycemic currently but is able to verbalize appropriate hypoglycemia management plan. Medication adherence appears to be appropriate. -Continued metformin  1000 mg BID.  -Continued Farxiga  10 mg daily.  -Extensively discussed pathophysiology of diabetes, recommended lifestyle interventions, dietary effects on blood sugar control.  -Counseled on s/sx of and management of hypoglycemia.  -Next A1c anticipated 11/2023.   ASCVD risk - primary prevention in patient with diabetes. Last LDL is not at goal of <70 mg/dL. 10-year ASCVD risk is extremely high. High intensity statin indicated. Did not fill this consistently in 2024. Current fills are uptodate. We will  check lipids at next visit. -Continued atorvastatin  40 mg.   Written patient instructions provided. Patient verbalized understanding of treatment plan.  Total time in face to face counseling 30 minutes.    Follow-up:  Pharmacist in 2 months. Dr. Lincoln Renshaw 10/16/2023.  Marene Shape, PharmD, Becky Bowels, CPP Clinical Pharmacist [facility] [phone]

## 2023-09-24 ENCOUNTER — Other Ambulatory Visit: Payer: Self-pay

## 2023-10-16 ENCOUNTER — Ambulatory Visit: Payer: Self-pay | Attending: Internal Medicine | Admitting: Internal Medicine

## 2023-10-16 ENCOUNTER — Other Ambulatory Visit: Payer: Self-pay

## 2023-10-16 ENCOUNTER — Encounter: Payer: Self-pay | Admitting: Internal Medicine

## 2023-10-16 VITALS — BP 102/59 | HR 67 | Ht 67.0 in | Wt 199.4 lb

## 2023-10-16 DIAGNOSIS — E1169 Type 2 diabetes mellitus with other specified complication: Secondary | ICD-10-CM | POA: Diagnosis not present

## 2023-10-16 DIAGNOSIS — Z79899 Other long term (current) drug therapy: Secondary | ICD-10-CM | POA: Diagnosis not present

## 2023-10-16 DIAGNOSIS — I10 Essential (primary) hypertension: Secondary | ICD-10-CM

## 2023-10-16 DIAGNOSIS — Z6831 Body mass index (BMI) 31.0-31.9, adult: Secondary | ICD-10-CM | POA: Insufficient documentation

## 2023-10-16 DIAGNOSIS — E1159 Type 2 diabetes mellitus with other circulatory complications: Secondary | ICD-10-CM | POA: Diagnosis not present

## 2023-10-16 DIAGNOSIS — K5900 Constipation, unspecified: Secondary | ICD-10-CM | POA: Insufficient documentation

## 2023-10-16 DIAGNOSIS — E669 Obesity, unspecified: Secondary | ICD-10-CM | POA: Insufficient documentation

## 2023-10-16 DIAGNOSIS — N401 Enlarged prostate with lower urinary tract symptoms: Secondary | ICD-10-CM | POA: Insufficient documentation

## 2023-10-16 DIAGNOSIS — N414 Granulomatous prostatitis: Secondary | ICD-10-CM | POA: Insufficient documentation

## 2023-10-16 DIAGNOSIS — E119 Type 2 diabetes mellitus without complications: Secondary | ICD-10-CM

## 2023-10-16 DIAGNOSIS — R350 Frequency of micturition: Secondary | ICD-10-CM | POA: Diagnosis not present

## 2023-10-16 DIAGNOSIS — Z7984 Long term (current) use of oral hypoglycemic drugs: Secondary | ICD-10-CM | POA: Insufficient documentation

## 2023-10-16 DIAGNOSIS — Z23 Encounter for immunization: Secondary | ICD-10-CM

## 2023-10-16 DIAGNOSIS — I152 Hypertension secondary to endocrine disorders: Secondary | ICD-10-CM | POA: Insufficient documentation

## 2023-10-16 DIAGNOSIS — E785 Hyperlipidemia, unspecified: Secondary | ICD-10-CM | POA: Diagnosis not present

## 2023-10-16 MED ORDER — ATORVASTATIN CALCIUM 40 MG PO TABS
40.0000 mg | ORAL_TABLET | Freq: Every day | ORAL | 3 refills | Status: AC
  Filled 2023-10-16 – 2023-11-27 (×2): qty 90, 90d supply, fill #0
  Filled 2024-02-16: qty 90, 90d supply, fill #1
  Filled 2024-05-25: qty 90, 90d supply, fill #2

## 2023-10-16 MED ORDER — ZOSTER VAC RECOMB ADJUVANTED 50 MCG/0.5ML IM SUSR
0.5000 mL | Freq: Once | INTRAMUSCULAR | 0 refills | Status: AC
Start: 2023-10-16 — End: 2023-10-17
  Filled 2023-10-16: qty 1, 1d supply, fill #0
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
## 2023-11-13 ENCOUNTER — Encounter

## 2023-11-20 ENCOUNTER — Ambulatory Visit: Payer: Self-pay | Admitting: Pharmacist

## 2023-11-27 ENCOUNTER — Other Ambulatory Visit: Payer: Self-pay | Admitting: Critical Care Medicine

## 2023-11-27 ENCOUNTER — Other Ambulatory Visit: Payer: Self-pay

## 2023-11-27 DIAGNOSIS — I10 Essential (primary) hypertension: Secondary | ICD-10-CM

## 2023-11-27 MED ORDER — LISINOPRIL-HYDROCHLOROTHIAZIDE 10-12.5 MG PO TABS
1.0000 | ORAL_TABLET | Freq: Every day | ORAL | 1 refills | Status: AC
  Filled 2023-11-27 – 2024-02-16 (×2): qty 90, 90d supply, fill #0
  Filled 2024-05-25: qty 90, 90d supply, fill #1

## 2023-11-28 ENCOUNTER — Other Ambulatory Visit: Payer: Self-pay

## 2023-11-30 ENCOUNTER — Other Ambulatory Visit: Payer: Self-pay

## 2023-12-07 ENCOUNTER — Other Ambulatory Visit: Payer: Self-pay

## 2023-12-09 ENCOUNTER — Other Ambulatory Visit: Payer: Self-pay

## 2024-02-16 ENCOUNTER — Other Ambulatory Visit: Payer: Self-pay

## 2024-02-19 ENCOUNTER — Other Ambulatory Visit: Payer: Self-pay

## 2024-02-19 ENCOUNTER — Other Ambulatory Visit: Payer: Self-pay | Admitting: Internal Medicine

## 2024-02-19 ENCOUNTER — Ambulatory Visit: Payer: Self-pay | Admitting: Internal Medicine

## 2024-02-19 MED ORDER — DAPAGLIFLOZIN PROPANEDIOL 10 MG PO TABS
10.0000 mg | ORAL_TABLET | Freq: Every day | ORAL | 0 refills | Status: AC
  Filled 2024-02-19: qty 30, 30d supply, fill #0

## 2024-03-01 ENCOUNTER — Other Ambulatory Visit: Payer: Self-pay

## 2024-03-02 ENCOUNTER — Other Ambulatory Visit: Payer: Self-pay

## 2024-05-25 ENCOUNTER — Other Ambulatory Visit: Payer: Self-pay

## 2024-05-25 ENCOUNTER — Other Ambulatory Visit: Payer: Self-pay | Admitting: Critical Care Medicine

## 2024-05-25 DIAGNOSIS — E669 Obesity, unspecified: Secondary | ICD-10-CM

## 2024-05-25 MED ORDER — METFORMIN HCL 1000 MG PO TABS
1000.0000 mg | ORAL_TABLET | Freq: Two times a day (BID) | ORAL | 0 refills | Status: AC
  Filled 2024-05-25: qty 60, 30d supply, fill #0

## 2024-05-26 ENCOUNTER — Other Ambulatory Visit: Payer: Self-pay
